# Patient Record
Sex: Female | Born: 1950 | Race: White | Hispanic: No | Smoking: Current every day smoker
Health system: Southern US, Community
[De-identification: ages and names within clinical notes are randomized; demographics above are authoritative.]

## PROBLEM LIST (undated history)

## (undated) DIAGNOSIS — J40 Bronchitis, not specified as acute or chronic: Secondary | ICD-10-CM

## (undated) DIAGNOSIS — I6522 Occlusion and stenosis of left carotid artery: Secondary | ICD-10-CM

## (undated) DIAGNOSIS — R0600 Dyspnea, unspecified: Secondary | ICD-10-CM

## (undated) DIAGNOSIS — E611 Iron deficiency: Secondary | ICD-10-CM

## (undated) DIAGNOSIS — R55 Syncope and collapse: Secondary | ICD-10-CM

## (undated) DIAGNOSIS — G8929 Other chronic pain: Secondary | ICD-10-CM

## (undated) DIAGNOSIS — Z9889 Other specified postprocedural states: Secondary | ICD-10-CM

## (undated) DIAGNOSIS — F39 Unspecified mood [affective] disorder: Secondary | ICD-10-CM

## (undated) DIAGNOSIS — M4800 Spinal stenosis, site unspecified: Secondary | ICD-10-CM

## (undated) DIAGNOSIS — J449 Chronic obstructive pulmonary disease, unspecified: Secondary | ICD-10-CM

## (undated) DIAGNOSIS — G2581 Restless legs syndrome: Secondary | ICD-10-CM

## (undated) DIAGNOSIS — R519 Headache, unspecified: Secondary | ICD-10-CM

## (undated) DIAGNOSIS — K219 Gastro-esophageal reflux disease without esophagitis: Secondary | ICD-10-CM

## (undated) DIAGNOSIS — G510 Bell's palsy: Secondary | ICD-10-CM

## (undated) DIAGNOSIS — I10 Essential (primary) hypertension: Secondary | ICD-10-CM

## (undated) DIAGNOSIS — F32A Depression, unspecified: Secondary | ICD-10-CM

## (undated) DIAGNOSIS — I639 Cerebral infarction, unspecified: Secondary | ICD-10-CM

## (undated) DIAGNOSIS — F419 Anxiety disorder, unspecified: Secondary | ICD-10-CM

## (undated) DIAGNOSIS — M549 Dorsalgia, unspecified: Principal | ICD-10-CM

## (undated) DIAGNOSIS — M797 Fibromyalgia: Secondary | ICD-10-CM

## (undated) DIAGNOSIS — I350 Nonrheumatic aortic (valve) stenosis: Secondary | ICD-10-CM

## (undated) DIAGNOSIS — J301 Allergic rhinitis due to pollen: Secondary | ICD-10-CM

## (undated) DIAGNOSIS — F329 Major depressive disorder, single episode, unspecified: Secondary | ICD-10-CM

## (undated) DIAGNOSIS — R112 Nausea with vomiting, unspecified: Secondary | ICD-10-CM

## (undated) DIAGNOSIS — R011 Cardiac murmur, unspecified: Secondary | ICD-10-CM

## (undated) DIAGNOSIS — E785 Hyperlipidemia, unspecified: Secondary | ICD-10-CM

## (undated) HISTORY — DX: Gastro-esophageal reflux disease without esophagitis: K21.9

## (undated) HISTORY — DX: Dorsalgia, unspecified: M54.9

## (undated) HISTORY — DX: Hyperlipidemia, unspecified: E78.5

## (undated) HISTORY — DX: Essential (primary) hypertension: I10

## (undated) HISTORY — DX: Allergic rhinitis due to pollen: J30.1

## (undated) HISTORY — PX: TONSILLECTOMY: SUR1361

## (undated) HISTORY — PX: EYE SURGERY: SHX253

## (undated) HISTORY — PX: OTHER SURGICAL HISTORY: SHX169

## (undated) HISTORY — PX: ESOPHAGOGASTRODUODENOSCOPY: SHX1529

## (undated) HISTORY — PX: BACK SURGERY: SHX140

## (undated) HISTORY — PX: FOOT SURGERY: SHX648

## (undated) HISTORY — DX: Unspecified mood (affective) disorder: F39

## (undated) HISTORY — DX: Other chronic pain: G89.29

---
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
## 2001-06-22 ENCOUNTER — Encounter: Payer: Self-pay | Admitting: Family Medicine

## 2001-06-22 ENCOUNTER — Encounter: Admission: RE | Admit: 2001-06-22 | Discharge: 2001-06-22 | Payer: Self-pay | Admitting: Family Medicine

## 2001-06-28 ENCOUNTER — Encounter: Admission: RE | Admit: 2001-06-28 | Discharge: 2001-06-28 | Payer: Self-pay | Admitting: Family Medicine

## 2001-06-28 ENCOUNTER — Encounter: Payer: Self-pay | Admitting: Family Medicine

## 2003-09-29 ENCOUNTER — Encounter: Admission: RE | Admit: 2003-09-29 | Discharge: 2003-09-29 | Payer: Self-pay | Admitting: Family Medicine

## 2004-01-24 ENCOUNTER — Encounter: Admission: RE | Admit: 2004-01-24 | Discharge: 2004-01-24 | Payer: Self-pay | Admitting: Family Medicine

## 2004-05-17 ENCOUNTER — Encounter (INDEPENDENT_AMBULATORY_CARE_PROVIDER_SITE_OTHER): Payer: Self-pay | Admitting: Specialist

## 2004-05-17 ENCOUNTER — Ambulatory Visit (HOSPITAL_COMMUNITY): Admission: RE | Admit: 2004-05-17 | Discharge: 2004-05-17 | Payer: Self-pay | Admitting: Gastroenterology

## 2006-04-02 ENCOUNTER — Encounter: Admission: RE | Admit: 2006-04-02 | Discharge: 2006-04-02 | Payer: Self-pay | Admitting: Family Medicine

## 2006-12-17 ENCOUNTER — Encounter: Payer: Self-pay | Admitting: Family Medicine

## 2008-04-27 ENCOUNTER — Encounter: Admission: RE | Admit: 2008-04-27 | Discharge: 2008-04-27 | Payer: Self-pay | Admitting: Neurosurgery

## 2008-06-13 ENCOUNTER — Encounter: Admission: RE | Admit: 2008-06-13 | Discharge: 2008-06-13 | Payer: Self-pay | Admitting: Obstetrics and Gynecology

## 2008-06-13 ENCOUNTER — Encounter: Admission: RE | Admit: 2008-06-13 | Discharge: 2008-06-13 | Payer: Self-pay | Admitting: Family Medicine

## 2008-07-03 ENCOUNTER — Encounter: Payer: Self-pay | Admitting: Family Medicine

## 2008-09-06 ENCOUNTER — Emergency Department (HOSPITAL_COMMUNITY): Admission: EM | Admit: 2008-09-06 | Discharge: 2008-09-06 | Payer: Self-pay | Admitting: Emergency Medicine

## 2008-09-14 ENCOUNTER — Ambulatory Visit: Payer: Self-pay | Admitting: Family Medicine

## 2008-09-14 DIAGNOSIS — J449 Chronic obstructive pulmonary disease, unspecified: Secondary | ICD-10-CM

## 2008-09-14 DIAGNOSIS — I10 Essential (primary) hypertension: Secondary | ICD-10-CM | POA: Insufficient documentation

## 2008-09-14 DIAGNOSIS — M940 Chondrocostal junction syndrome [Tietze]: Secondary | ICD-10-CM | POA: Insufficient documentation

## 2008-10-18 ENCOUNTER — Telehealth: Payer: Self-pay | Admitting: Family Medicine

## 2008-10-19 ENCOUNTER — Ambulatory Visit: Payer: Self-pay | Admitting: Family Medicine

## 2009-01-03 ENCOUNTER — Telehealth: Payer: Self-pay | Admitting: Family Medicine

## 2009-03-23 ENCOUNTER — Ambulatory Visit: Payer: Self-pay | Admitting: Family Medicine

## 2009-03-27 ENCOUNTER — Telehealth: Payer: Self-pay | Admitting: Family Medicine

## 2009-05-09 ENCOUNTER — Ambulatory Visit: Payer: Self-pay | Admitting: Family Medicine

## 2009-05-09 DIAGNOSIS — M5126 Other intervertebral disc displacement, lumbar region: Secondary | ICD-10-CM

## 2009-05-09 DIAGNOSIS — F17200 Nicotine dependence, unspecified, uncomplicated: Secondary | ICD-10-CM | POA: Insufficient documentation

## 2009-05-28 ENCOUNTER — Telehealth: Payer: Self-pay | Admitting: Family Medicine

## 2009-06-04 ENCOUNTER — Telehealth: Payer: Self-pay | Admitting: Family Medicine

## 2009-06-07 ENCOUNTER — Ambulatory Visit: Payer: Self-pay | Admitting: Family Medicine

## 2009-06-07 DIAGNOSIS — T148 Other injury of unspecified body region: Secondary | ICD-10-CM

## 2009-06-07 DIAGNOSIS — W57XXXA Bitten or stung by nonvenomous insect and other nonvenomous arthropods, initial encounter: Secondary | ICD-10-CM

## 2009-06-20 ENCOUNTER — Encounter: Admission: RE | Admit: 2009-06-20 | Discharge: 2009-06-20 | Payer: Self-pay | Admitting: Neurosurgery

## 2009-07-12 ENCOUNTER — Ambulatory Visit: Payer: Self-pay | Admitting: Family Medicine

## 2009-07-24 ENCOUNTER — Ambulatory Visit: Payer: Self-pay | Admitting: Family Medicine

## 2009-07-24 DIAGNOSIS — E669 Obesity, unspecified: Secondary | ICD-10-CM | POA: Insufficient documentation

## 2009-07-24 LAB — CONVERTED CEMR LAB
ALT: 23 units/L (ref 0–35)
AST: 21 units/L (ref 0–37)
BUN: 11 mg/dL (ref 6–23)
Basophils Relative: 0.6 % (ref 0.0–3.0)
Chloride: 101 meq/L (ref 96–112)
Direct LDL: 154.9 mg/dL
Eosinophils Relative: 1.1 % (ref 0.0–5.0)
GFR calc non Af Amer: 68.12 mL/min (ref >60)
Glucose, Urine, Semiquant: NEGATIVE
HCT: 37.6 % (ref 36.0–46.0)
HDL: 48.2 mg/dL (ref >39.00)
Hemoglobin: 13.2 g/dL (ref 12.0–15.0)
Lymphs Abs: 2.8 10*3/uL (ref 0.7–4.0)
MCV: 95.6 fL (ref 78.0–100.0)
Monocytes Absolute: 0.8 10*3/uL (ref 0.1–1.0)
Monocytes Relative: 7.1 % (ref 3.0–12.0)
Neutro Abs: 6.8 10*3/uL (ref 1.4–7.7)
Nitrite: NEGATIVE
Potassium: 4.1 meq/L (ref 3.5–5.1)
Protein, U semiquant: NEGATIVE
RBC: 3.93 M/uL (ref 3.87–5.11)
Sodium: 137 meq/L (ref 135–145)
TSH: 1.31 microintl units/mL (ref 0.35–5.50)
Total Bilirubin: 0.9 mg/dL (ref 0.3–1.2)
Total CHOL/HDL Ratio: 5
Total Protein: 6.8 g/dL (ref 6.0–8.3)
VLDL: 21.8 mg/dL (ref 0.0–40.0)
WBC Urine, dipstick: NEGATIVE
WBC: 10.6 10*3/uL — ABNORMAL HIGH (ref 4.5–10.5)
pH: 7

## 2009-08-09 ENCOUNTER — Telehealth: Payer: Self-pay | Admitting: Family Medicine

## 2009-08-15 ENCOUNTER — Telehealth: Payer: Self-pay | Admitting: Family Medicine

## 2009-08-23 ENCOUNTER — Encounter: Admission: RE | Admit: 2009-08-23 | Discharge: 2009-08-23 | Payer: Self-pay | Admitting: Family Medicine

## 2009-08-23 ENCOUNTER — Encounter: Payer: Self-pay | Admitting: Family Medicine

## 2009-08-23 LAB — HM MAMMOGRAPHY

## 2009-10-12 ENCOUNTER — Ambulatory Visit: Payer: Self-pay | Admitting: Family Medicine

## 2009-10-23 ENCOUNTER — Ambulatory Visit: Payer: Self-pay | Admitting: Family Medicine

## 2009-10-25 ENCOUNTER — Ambulatory Visit: Payer: Self-pay | Admitting: Family Medicine

## 2009-10-25 LAB — CONVERTED CEMR LAB
Direct LDL: 187.2 mg/dL
HDL: 48.4 mg/dL (ref >39.00)
Vit D, 25-Hydroxy: 30 ng/mL (ref 30–89)

## 2009-12-24 ENCOUNTER — Ambulatory Visit: Payer: Self-pay | Admitting: Family Medicine

## 2009-12-24 DIAGNOSIS — J209 Acute bronchitis, unspecified: Secondary | ICD-10-CM

## 2009-12-27 ENCOUNTER — Ambulatory Visit: Payer: Self-pay | Admitting: Family Medicine

## 2009-12-28 ENCOUNTER — Telehealth: Payer: Self-pay | Admitting: Family Medicine

## 2010-01-01 ENCOUNTER — Telehealth: Payer: Self-pay | Admitting: Family Medicine

## 2010-01-01 ENCOUNTER — Telehealth: Payer: Self-pay

## 2010-01-07 ENCOUNTER — Telehealth: Payer: Self-pay | Admitting: Family Medicine

## 2010-01-24 ENCOUNTER — Telehealth: Payer: Self-pay | Admitting: Family Medicine

## 2010-02-19 ENCOUNTER — Telehealth: Payer: Self-pay | Admitting: Family Medicine

## 2010-02-26 ENCOUNTER — Telehealth: Payer: Self-pay | Admitting: Family Medicine

## 2010-03-12 ENCOUNTER — Encounter: Payer: Self-pay | Admitting: Family Medicine

## 2010-05-17 ENCOUNTER — Telehealth: Payer: Self-pay | Admitting: Family Medicine

## 2010-05-28 ENCOUNTER — Telehealth (INDEPENDENT_AMBULATORY_CARE_PROVIDER_SITE_OTHER): Payer: Self-pay | Admitting: *Deleted

## 2010-06-25 ENCOUNTER — Telehealth: Payer: Self-pay | Admitting: *Deleted

## 2010-06-26 ENCOUNTER — Telehealth: Payer: Self-pay | Admitting: Family Medicine

## 2010-07-01 ENCOUNTER — Telehealth: Payer: Self-pay | Admitting: Internal Medicine

## 2010-07-05 ENCOUNTER — Telehealth: Payer: Self-pay | Admitting: Family Medicine

## 2010-07-09 ENCOUNTER — Telehealth: Payer: Self-pay | Admitting: Family Medicine

## 2010-07-16 ENCOUNTER — Ambulatory Visit: Payer: Self-pay | Admitting: Family Medicine

## 2010-07-25 ENCOUNTER — Ambulatory Visit: Payer: Self-pay | Admitting: Family Medicine

## 2010-07-25 ENCOUNTER — Encounter: Payer: Self-pay | Admitting: Family Medicine

## 2010-07-25 DIAGNOSIS — R55 Syncope and collapse: Secondary | ICD-10-CM | POA: Insufficient documentation

## 2010-07-25 LAB — CONVERTED CEMR LAB
Albumin: 3.9 g/dL (ref 3.5–5.2)
BUN: 14 mg/dL (ref 6–23)
Basophils Absolute: 0 10*3/uL (ref 0.0–0.1)
CO2: 29 meq/L (ref 19–32)
Cholesterol: 145 mg/dL (ref 0–200)
Eosinophils Absolute: 0.1 10*3/uL (ref 0.0–0.7)
GFR calc non Af Amer: 78.9 mL/min (ref >60.00)
Glucose, Bld: 99 mg/dL (ref 70–99)
HCT: 39.9 % (ref 36.0–46.0)
HDL: 53.6 mg/dL (ref >39.00)
Hemoglobin: 13.9 g/dL (ref 12.0–15.0)
Ketones, ur: NEGATIVE mg/dL
Leukocytes, UA: NEGATIVE
Lymphs Abs: 2.4 10*3/uL (ref 0.7–4.0)
MCHC: 34.9 g/dL (ref 30.0–36.0)
Monocytes Absolute: 0.6 10*3/uL (ref 0.1–1.0)
Neutro Abs: 6.3 10*3/uL (ref 1.4–7.7)
Nitrite: NEGATIVE
Platelets: 394 10*3/uL (ref 150.0–400.0)
Potassium: 4.1 meq/L (ref 3.5–5.1)
RDW: 13.5 % (ref 11.5–14.6)
Sodium: 135 meq/L (ref 135–145)
Specific Gravity, Urine: 1.01 (ref 1.000–1.030)
TSH: 1.7 microintl units/mL (ref 0.35–5.50)
Total Bilirubin: 0.5 mg/dL (ref 0.3–1.2)
Urobilinogen, UA: 0.2 (ref 0.0–1.0)
VLDL: 21.4 mg/dL (ref 0.0–40.0)
pH: 7.5 (ref 5.0–8.0)

## 2010-08-13 ENCOUNTER — Ambulatory Visit
Admission: RE | Admit: 2010-08-13 | Discharge: 2010-08-13 | Payer: Self-pay | Source: Home / Self Care | Attending: Family Medicine | Admitting: Family Medicine

## 2010-08-13 DIAGNOSIS — M949 Disorder of cartilage, unspecified: Secondary | ICD-10-CM

## 2010-08-13 DIAGNOSIS — R079 Chest pain, unspecified: Secondary | ICD-10-CM | POA: Insufficient documentation

## 2010-08-13 DIAGNOSIS — M899 Disorder of bone, unspecified: Secondary | ICD-10-CM | POA: Insufficient documentation

## 2010-08-16 ENCOUNTER — Ambulatory Visit
Admission: RE | Admit: 2010-08-16 | Discharge: 2010-08-16 | Payer: Self-pay | Source: Home / Self Care | Attending: Family Medicine | Admitting: Family Medicine

## 2010-08-25 ENCOUNTER — Encounter: Payer: Self-pay | Admitting: Obstetrics and Gynecology

## 2010-09-03 NOTE — Progress Notes (Signed)
Summary: Requesting OV  Phone Note Call from Patient   Caller: Patient Call For: [name] MD/Fry/Suandrea Summary of Call: VM from pt c/o "sick all week, diarrhea X 2 days, congestion, has chronic bronchitis, pain pills not working".  requested OV with Dr Clent Ridges today or next week Monday.  Only acute visits available with Dr Clent Ridges, and Dr Scotty Court next week Initial call taken by: Sid Falcon LPN,  July 05, 2010 11:34 AM  Follow-up for Phone Call        have her see Dr. Scotty Court next week  Follow-up by: Nelwyn Salisbury MD,  July 05, 2010 11:49 AM  Additional Follow-up for Phone Call Additional follow up Details #1::        spoke with pt diarrhea since sunday -- stopped   had bright red blood x 2 this am .  symptoms "achy feeling congestion  1/2 way nauseaed no vomiting .  coughing little bit yellow sputum  little bit.  Has not taken fever.  just taking tylenol for achiness which has helped .appt offered    for tuesday and thurday and declined cause  she has "class"  and stated will cont. fluids   and tylenol will call tuesday to speak with dr Scotty Court . questions about her pain meds.  Additional Follow-up by: Pura Spice, RN,  July 05, 2010 1:00 PM

## 2010-09-03 NOTE — Progress Notes (Signed)
Summary: different med  Phone Note Call from Patient Call back at Home Phone [phone]   Caller: Patient Call For: [name] MD Summary of Call:  Pt is taking  nabumetone, pt stated doc was going to call in different antiflamatory med  and also cough med. [facility]  [phone]. pt is aware doc out office until 01-01-2010.  Initial call taken by: Heron Sabins,  Dec 28, 2009 12:30 PM  Follow-up for Phone Call        duplicate mess   Follow-up by: Pura Spice, RN,  Jan 01, 2010 1:48 PM

## 2010-09-03 NOTE — Assessment & Plan Note (Signed)
Summary: right arm pain/multiple complaints/dm   Vital Signs:  Patient profile:   60 year old female Weight:      193 pounds O2 Sat:      95 % Temp:     98.3 degrees F Pulse rate:   104 / minute BP sitting:   130 / 84  (left arm)  Vitals Entered By: Pura Spice, RN (Dec 27, 2009 3:18 PM) CC: saw dr fry on monday was given z pak  c/o not able to move rt arm throat better.    History of Present Illness: This 60 year old white divorced female is in today as a followup from seeing Dr. from May 23 with a history of having had the previous week sore throat aching chills fever as well as a cough ear it she continues to cough and 8 and can't sleep tonight due to the cough cough is nonproductive she also complains of pain in her right arm or shoulder pain all movement. She also continues to have symptoms of costochondritis which theNSAID is not controlling the arthritic pain hypertension well controlled  Allergies: 1)  ! Lipitor (Atorvastatin Calcium) 2)  ! Jonne Ply  Past History:  Past Medical History: Last updated: 09/14/2008 arthritis  chicken pox bronchitis Allergic rhinitis GERD Hyperlipidemia Hypertension  Past Surgical History: Last updated: 09/14/2008 foot surgery 1979 1988 back problems 1997  sciatic nerve bulging disc herniated disc   Social History: Last updated: 09/14/2008 Divorced Occupation:   Marine scientist   Risk Factors: Smoking Status: current (03/23/2009) Packs/Day: 1.0 (03/23/2009)  Review of Systems      See HPI  The patient denies anorexia, fever, weight loss, weight gain, vision loss, decreased hearing, hoarseness, chest pain, syncope, dyspnea on exertion, peripheral edema, prolonged cough, headaches, hemoptysis, abdominal pain, melena, hematochezia, severe indigestion/heartburn, hematuria, incontinence, genital sores, muscle weakness, suspicious skin lesions, transient blindness, difficulty walking, depression, unusual weight change, abnormal  bleeding, enlarged lymph nodes, angioedema, breast masses, and testicular masses.    Physical Exam  General:  Well-developed,well-nourished,in no acute distress; alert,appropriate and cooperative throughout examinationoverweight-appearing.   Head:  Normocephalic and atraumatic without obvious abnormalities. No apparent alopecia or balding. Eyes:  No corneal or conjunctival inflammation noted. EOMI. Perrla. Funduscopic exam benign, without hemorrhages, exudates or papilledema. Vision grossly normal. Ears:  External ear exam shows no significant lesions or deformities.  Otoscopic examination reveals clear canals, tympanic membranes are intact bilaterally without bulging, retraction, inflammation or discharge. Hearing is grossly normal bilaterally. Nose:  External nasal examination shows no deformity or inflammation. Nasal mucosa are pink and moist without lesions or exudates. Mouth:  Oral mucosa and oropharynx without lesions or exudates.  Teeth in good repair. Chest Wall:  minimal tenderness costochondral joint on left 2 through 6 Lungs:  rhonchi chest bilaterally no rales no dullness no wheezes Heart:  Normal rate and regular rhythm. S1 and S2 normal without gallop, murmur, click, rub or other extra sounds. Msk:  tenderness both shoulders but no limitation of movement Extremities:  No clubbing, cyanosis, edema, or deformity noted with normal full range of motion of all joints.     Impression & Recommendations:  Problem # 1:  ARTHRITIS (ICD-716.90) Assessment Deteriorated Depo-Medrol 120 mg plus prednisone decrease in dosage  Problem # 2:  ACUTE BRONCHITIS (ICD-466.0) Assessment: Improved  Her updated medication list for this problem includes:    Symbicort 160-4.5 Mcg/act Aero (Budesonide-formoterol fumarate) .Marland Kitchen... 2 inhalation bid    Hydromet 5-1.5 Mg/48ml Syrp (Hydrocodone-homatropine) .Marland Kitchen... 1 -2 tsp every 4-6  hrs as needed cough  Problem # 3:  ANXIETY DEPRESSION  (ICD-300.4) Assessment: Unchanged  Problem # 4:  EXOGENOUS OBESITY (ICD-278.00) Assessment: Unchanged  Problem # 5:  GERD (ICD-530.81) Assessment: Improved  Her updated medication list for this problem includes:    Prevacid 30 Mg Cpdr (Lansoprazole) .Marland Kitchen... 1 qd  Problem # 6:  COPD, MILD (ICD-496) Assessment: Unchanged  Her updated medication list for this problem includes:    Symbicort 160-4.5 Mcg/act Aero (Budesonide-formoterol fumarate) .Marland Kitchen... 2 inhalation bid  Complete Medication List: 1)  Symbicort 160-4.5 Mcg/act Aero (Budesonide-formoterol fumarate) .... 2 inhalation bid 2)  Prevacid 30 Mg Cpdr (Lansoprazole) .Marland Kitchen.. 1 qd 3)  Lovaza 1 Gm Caps (Omega-3-acid ethyl esters) .... 2 bid 4)  Avalide 150-12.5 Mg Tabs (Irbesartan-hydrochlorothiazide) .Marland Kitchen.. 1 qd 5)  Vitamin D 400 Unit Tabs (Cholecalciferol) 6)  Calcium 500 Mg Tabs (Calcium carbonate) 7)  Alprazolam 1 Mg Tabs (Alprazolam) .Marland Kitchen.. 1 by mouth three times a day as needed stress anxiety. 8)  Hydrocodone-acetaminophen 7.5-500 Mg Tabs (Hydrocodone-acetaminophen) .Marland Kitchen.. 1 by mouth q 4-6 hrs as needed pain not to exceed 4 per day. 9)  Hyzaar 100-25 Mg Tabs (Losartan potassium-hctz) .Marland Kitchen.. 1 qd 10)  Crestor 20 Mg Tabs (Rosuvastatin calcium) .Marland Kitchen.. 1 qd 11)  Prednisone 10 Mg Tabs (Prednisone) .... 2 sta 1 qid one day, 1 three times a day for5 days, 1 two times a day for 10 days then 1 once daily for 9 daystendonitis and costochondritis 12)  Oxycodone-acetaminophen 10-650 Mg Tabs (Oxycodone-acetaminophen) .Marland Kitchen.. 1 q4h as needed severe pain 13)  Sulindac 200 Mg Tabs (Sulindac) .Marland Kitchen.. 1 by mouth two times a day for arthiris or antiinflammatory 14)  Hydromet 5-1.5 Mg/60ml Syrp (Hydrocodone-homatropine) .Marland Kitchen.. 1 -2 tsp every 4-6 hrs as needed cough  Other Orders: Depo- Medrol 80mg  (J1040) Depo- Medrol 40mg  (J1030) Admin of Therapeutic Inj  intramuscular or subcutaneous ([address])  Patient Instructions: 1)  For your bronchitis continued and finished the  Z-Pak 2)  To give you 120 mg Depo-Medrol both for arthritis as well as a bronchitis 3)  Hydromet cough medication 4)  Prednisone decrease in dosage for both costochondritis as well as arthritis Prescriptions: HYDROMET 5-1.5 MG/5ML SYRP (HYDROCODONE-HOMATROPINE) 1 -2 tsp every 4-6 hrs as needed cough  #240 x 0   Entered by:   Pura Spice, RN   Authorized by:   [name] MD   Signed by:   Pura Spice, RN on 01/01/2010   Method used:   Telephoned to ...       [facility]       [address]       [place], [place]  [address]       Ph: [phone] or [phone]       Fax: [phone]   RxID:   [id number] SULINDAC 200 MG TABS (SULINDAC) 1 by mouth two times a day for arthiris or antiinflammatory  #60 x 11   Entered by:   Pura Spice, RN   Authorized by:   [name] MD   Signed by:   Pura Spice, RN on 01/01/2010   Method used:   Electronically to        [facility] (retail)       [address]       [place], [place]  [address]       Ph: [phone] or [phone]       Fax: [phone]   RxID:  [id number] OXYCODONE-ACETAMINOPHEN 10-650 MG TABS (OXYCODONE-ACETAMINOPHEN) 1 q4h as needed severe pain  #100 x 0   Entered and Authorized by:   [name] MD   Signed by:   [name] MD on 12/27/2009   Method used:   Print then Give to Patient   RxID:   [id number] PREDNISONE 10 MG TABS (PREDNISONE) 2 sta 1 qid one day, 1 three times a day for5 days, 1 two times a day for 10 days then 1 once daily for 9 daystendonitis and costochondritis  #40 x 1   Entered and Authorized by:   [name] MD   Signed by:   [name] MD on 12/27/2009   Method used:   [facility]        [facility] (retail)       [address]       [place], [place]  [address]       Ph: [phone] or [phone]       Fax: [phone]   RxID:    [id number]    Medication Administration  Injection # 1:    Medication: Depo- Medrol 80mg     Diagnosis: COSTOCHONDRITIS (ICD-733.6)    Route: IM    Site: RUOQ gluteus    Exp Date: 06/2012    Lot #: [id number]    Mfr: [employer]acia    Patient tolerated injection without complications    Given by: Pura Spice, RN (Dec 27, 2009 4:35 PM)  Injection # 2:    Medication: Depo- Medrol 40mg     Diagnosis: COSTOCHONDRITIS (ICD-733.6)    Route: IM    Site: RUOQ gluteus    Exp Date: 06/2012    Lot #: [id number]    Mfr: [employer]    Patient tolerated injection without complications    Given by: Pura Spice, RN (Dec 27, 2009 4:36 PM)  Orders Added: 1)  Depo- Medrol 80mg  [J1040] 2)  Depo- Medrol 40mg  [J1030] 3)  Admin of Therapeutic Inj  intramuscular or subcutaneous [[address]] 4)  Est. Patient Level IV [[address]]

## 2010-09-03 NOTE — Progress Notes (Signed)
Summary: gets infection easily & requests pain pills  Phone Note Call from Patient Call back at Home Phone [phone]   Caller: Patient Call For: [name] MD Reason for Call: Talk to Doctor Details for Reason: rx  Summary of Call: patient is going to the dentist on August 2nd and would like to know if she should take a ATB before going Initial call taken by: Kern Reap CMA Duncan Dull),  February 26, 2010 5:18 PM  Follow-up for Phone Call        does she go to a cardiologist or has she ever been told she has a heart condition requiring abt . We dont have any cardiac problems in her chart unless she hasn't told us.  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  Does have COPD & chronic bronchitis & is concerned about getting infection from getting her teeth cleaned.  Is out of pain pills & needs refill of hydrocodone to use, not the oxycodone.  The inflammation med was helping, but not enough now for ribs to quit hurting, the costeobronchitis.  [facility] [address]  One in her chart.  Aspirin intolerance.   Follow-up by: Rudy Jew, RN,  February 27, 2010 2:36 PM  Additional Follow-up for Phone Call Additional follow up Details #3:: Details for Additional Follow-up Action Taken: per dr Alfonzo Feller does  not need ABT before dentist and [facility]  Left message H# to notify.  Rudy Jew, RN  February 27, 2010 5:09 PM  Additional Follow-up by: Pura Spice, RN,  February 27, 2010 4:40 PM  Prescriptions: HYDROCODONE-ACETAMINOPHEN 7.5-500 MG TABS (HYDROCODONE-ACETAMINOPHEN) 1 by mouth q 4-6 hrs as needed pain not to exceed 4  per day.  #50 x 1   Entered by:   Pura Spice, RN   Authorized by:   [name] MD   Signed by:   Rudy Jew, RN on 02/27/2010   Method used:   Telephoned to ...       [facility] (retail)       [address]       [place], [place]  [address]       Ph: [phone] or [phone]       Fax: [phone]   RxID:   [id number]

## 2010-09-03 NOTE — Progress Notes (Signed)
Summary: ?about meds   Phone Note Call from Patient Call back at [facility] Phone [phone] Call back at Work Phone [phone]   Caller: Patient Call For: [name] MD Summary of Call: Only wants to talk to Dr. Charmian Muff nurse, about her meds Initial call taken by: [facility] CMA AAMA,  May 28, 2010 12:52 PM  Follow-up for Phone Call        I called pt and she states "she didn't leave any message about meds"? She only wants to speak to Dr Laurita Quint nurse? I informed pt that Almira Coaster is with another MD right now and pt stated that she only wants to talk to Dr Laurita Quint nurse. Follow-up by: Josph Macho RMA,  May 28, 2010 1:21 PM  Additional Follow-up for Phone Call Additional follow up Details #1::        left mess to return call  Additional Follow-up by: Pura Spice, RN,  May 28, 2010 1:59 PM

## 2010-09-03 NOTE — Progress Notes (Signed)
Summary: refill on alprazolam  Phone Note From Pharmacy   [facility] Reason for Call: Needs renewal Details for Reason: alprazolam 1mg  Summary of Call: Last filled on 06/03/10-90 tabs-  30 days will be up on 11/29 Initial call taken by: Romualdo Bolk, CMA (AAMA),  June 25, 2010 4:45 PM  Follow-up for Phone Call        ok to fill  x 1 only    on 11/ 28  can send  in tomorrow   because of holiday weekend .   further refills through Dr Scotty Court  Follow-up by: Madelin Headings MD,  June 25, 2010 5:06 PM  Additional Follow-up for Phone Call Additional follow up Details #1::        Faxed to pharmacy. Additional Follow-up by: Romualdo Bolk, CMA (AAMA),  June 26, 2010 8:43 AM    New/Updated Medications: ALPRAZOLAM 1 MG TABS (ALPRAZOLAM) 1 by mouth three times a day as needed stress anxiety. Fill on or after 11/28 Prescriptions: ALPRAZOLAM 1 MG TABS (ALPRAZOLAM) 1 by mouth three times a day as needed stress anxiety. Fill on or after 11/28  #90 x 0   Entered by:   Romualdo Bolk, CMA (AAMA)   Authorized by:   Madelin Headings MD   Signed by:   Romualdo Bolk, CMA (AAMA) on 06/26/2010   Method used:   Handwritten   RxID:   [id number]

## 2010-09-03 NOTE — Assessment & Plan Note (Signed)
Summary: 3 month follow up/cjr   Vital Signs:  Patient profile:   60 year old female Weight:      203 pounds BMI:     33.90 O2 Sat:      95 % Temp:     98.3 degrees F Pulse rate:   96 / minute BP sitting:   140 / 88  (left arm)  Vitals Entered By: Pura Spice, RN (October 23, 2009 1:17 PM) CC: talk about lab results and ? about costcocondritisi   Is Patient Diabetic? No   History of Present Illness: Testicular and year-old white divorced female who has a problem with hyperlipidemia is in today to discuss recent lab results regarding elevated lipids. He complains of no energy however her weight she had been tense and upset recently over some family problems as well as some depression ears were removed as does probably addition of Lexapro to her medication list She is concerned that she continues to have some symptoms of costochondritis and it is in order that we have a chest x-ray. Continues to have arthritic pains and will add nabumetone and stop baclofen that Continues to have some shortness of breath and occasional wheezing GERD is under control with Prevaciid Continues to have low back pain and needs hydrocodone occasionally No other complaints  Allergies: 1)  ! Lipitor (Atorvastatin Calcium) 2)  ! Jonne Ply  Past History:  Past Medical History: Last updated: 09/14/2008 arthritis  chicken pox bronchitis Allergic rhinitis GERD Hyperlipidemia Hypertension  Past Surgical History: Last updated: 09/14/2008 foot surgery 1979 1988 back problems 1997  sciatic nerve bulging disc herniated disc   Social History: Last updated: 09/14/2008 Divorced Occupation:   Marine scientist   Risk Factors: Smoking Status: current (03/23/2009) Packs/Day: 1.0 (03/23/2009)  Review of Systems  The patient denies anorexia, fever, weight loss, weight gain, vision loss, decreased hearing, hoarseness, chest pain, syncope, dyspnea on exertion, peripheral edema, prolonged cough, headaches,  hemoptysis, abdominal pain, melena, hematochezia, severe indigestion/heartburn, hematuria, incontinence, genital sores, muscle weakness, suspicious skin lesions, transient blindness, difficulty walking, depression, unusual weight change, abnormal bleeding, enlarged lymph nodes, angioedema, breast masses, and testicular masses.    Physical Exam  General:  Well-developed,well-nourished,in no acute distress; alert,appropriate and cooperative throughout examination Head:  Normocephalic and atraumatic without obvious abnormalities. No apparent alopecia or balding. Eyes:  No corneal or conjunctival inflammation noted. EOMI. Perrla. Funduscopic exam benign, without hemorrhages, exudates or papilledema. Vision grossly normal. Ears:  External ear exam shows no significant lesions or deformities.  Otoscopic examination reveals clear canals, tympanic membranes are intact bilaterally without bulging, retraction, inflammation or discharge. Hearing is grossly normal bilaterally. Nose:  External nasal examination shows no deformity or inflammation. Nasal mucosa are pink and moist without lesions or exudates. Mouth:  Oral mucosa and oropharynx without lesions or exudates.  Teeth in good repair. Neck:  No deformities, masses, or tenderness noted. Chest Wall:  minimal costochondral tenderness over the left chest rib 2 -8 Lungs:  decreased breath sounds with minimal or slight expiratory wheeze bilaterallyno dullness and no crackles.   Heart:  Normal rate and regular rhythm. S1 and S2 normal without gallop, murmur, click, rub or other extra sounds.   Impression & Recommendations:  Problem # 1:  ANXIETY DEPRESSION (ICD-300.4) Assessment New add Lexapro to alprazolam  Problem # 2:  EXOGENOUS OBESITY (ICD-278.00) Assessment: Unchanged  Problem # 3:  HERNIATED LUMBOSACRAL DISC (ICD-722.10) Assessment: Unchanged  Problem # 4:  SMOKER (ICD-305.1) Assessment: Unchanged  Problem # 5:  COSTOCHONDRITIS  (ICD-733.6) Assessment: Improved  Her updated medication list for this problem includes:    Vitamin D 400 Unit Tabs (Cholecalciferol)    Calcium 500 Mg Tabs (Calcium carbonate)  Orders: T-2 View CXR (71020TC)  Problem # 6:  GERD (ICD-530.81) Assessment: Improved  Her updated medication list for this problem includes:    Prevacid 30 Mg Cpdr (Lansoprazole) .Marland Kitchen... 1 qd  Problem # 7:  HYPERLIPIDEMIA (ICD-272.4) Assessment: Deteriorated  Her updated medication list for this problem includes:    Lovaza 1 Gm Caps (Omega-3-acid ethyl esters) .Marland Kitchen... 2 bid    Crestor 20 Mg Tabs (Rosuvastatin calcium) .Marland Kitchen... 1 qd  Problem # 8:  HYPERTENSION (ICD-401.9) Assessment: Improved  Her updated medication list for this problem includes:    Avalide 150-12.5 Mg Tabs (Irbesartan-hydrochlorothiazide) .Marland Kitchen... 1 qd    Hyzaar 100-25 Mg Tabs (Losartan potassium-hctz) .Marland Kitchen... 1 qd  Problem # 9:  COPD, MILD (ICD-496) Assessment: Unchanged  Her updated medication list for this problem includes:    Symbicort 160-4.5 Mcg/act Aero (Budesonide-formoterol fumarate) .Marland Kitchen... 2 inhalation bid  Complete Medication List: 1)  Symbicort 160-4.5 Mcg/act Aero (Budesonide-formoterol fumarate) .... 2 inhalation bid 2)  Prevacid 30 Mg Cpdr (Lansoprazole) .Marland Kitchen.. 1 qd 3)  Lovaza 1 Gm Caps (Omega-3-acid ethyl esters) .... 2 bid 4)  Avalide 150-12.5 Mg Tabs (Irbesartan-hydrochlorothiazide) .Marland Kitchen.. 1 qd 5)  Vitamin D 400 Unit Tabs (Cholecalciferol) 6)  Calcium 500 Mg Tabs (Calcium carbonate) 7)  Alprazolam 1 Mg Tabs (Alprazolam) .Marland Kitchen.. 1 by mouth three times a day as needed stress anxiety. 8)  Hydrocodone-acetaminophen 7.5-500 Mg Tabs (Hydrocodone-acetaminophen) .Marland Kitchen.. 1 by mouth q 4-6 hrs as needed pain not to exceed 4 per day. 9)  Lexapro 10 Mg Tabs (Escitalopram oxalate) .Marland Kitchen.. 1 qd 10)  Hyzaar 100-25 Mg Tabs (Losartan potassium-hctz) .Marland Kitchen.. 1 qd 11)  Nabumetone 750 Mg Tabs (Nabumetone) .Marland Kitchen.. 1 once daily for inflammation 12)  Crestor 20 Mg  Tabs (Rosuvastatin calcium) .Marland Kitchen.. 1 qd  Patient Instructions: 1)  Since the total cholesterol and LDL or total lipids are elevated we'll increase Crestor 20 mg q.d. also continue lavage and attempt to control back 2)  You need to lose weight. Consider a lower calorie diet and regular exercise.  3)  To help the anxiety and depression would like to add Lexapro 10 mg q.d. in addition to the alprazolam. 4)  Will change diclofenac to Nabumetone Prescriptions: CRESTOR 20 MG TABS (ROSUVASTATIN CALCIUM) 1 qd  #30 x 11   Entered and Authorized by:   [name] MD   Signed by:   [name] MD on 10/25/2009   Method used:   [facility]        [facility] [facility])       [address]       [place], [place]  [address]       Ph: [phone] or [phone]       Fax: [phone]   RxID:   [id number] NABUMETONE 750 MG TABS (NABUMETONE) 1 once daily for inflammation  #60 x 11   Entered and Authorized by:   [name] MD   Signed by:   [name] MD on 10/23/2009   Method used:   [facility]        [facility] ([facility])       [address]       [place], [place]  [address]       Ph:  [phone] or [phone]       Fax: [phone]   RxID:   [id number] HYZAAR 100-25 MG TABS (LOSARTAN POTASSIUM-HCTZ) 1 qd  #90 x 11   Entered and Authorized by:   [name] MD   Signed by:   [name] MD on 10/23/2009   Method used:   [facility]ically to        [facility] (retail)       [address]       [place], [place]  [address]       Ph: [phone] or [phone]       Fax: [phone]   RxID:   [id number]

## 2010-09-03 NOTE — Progress Notes (Signed)
Summary: Rx: Refill  Phone Note Refill Request Message from:  Fax from Pharmacy on July 01, 2010 2:34 PM  Refills Requested: Medication #1:  ALPRAZOLAM 1 MG TABS 1 by mouth three times a day as needed stress anxiety. Fill on or after 11/28   Last Refilled: 06/03/2010   Notes: #90 Initial call taken by: Trixie Dredge,  July 01, 2010 2:35 PM  Follow-up for Phone Call        Med filled on 11/23 by Dr Clent Ridges Carollee Herter Follow-up by: Sid Falcon LPN,  July 02, 2010 8:50 AM

## 2010-09-03 NOTE — Assessment & Plan Note (Signed)
Summary: COLD, CONGESTION // RS   Vital Signs:  Patient profile:   60 year old female Weight:      193 pounds BMI:     32.23 Temp:     98.6 degrees F oral BP sitting:   100 / 70  (left arm) Cuff size:   large  Vitals Entered By: Raechel Ache, RN (Dec 24, 2009 3:10 PM) CC: Sick since Thursday with sore throat, aches, chills, eyes red and matted, sore chest and cough   History of Present Illness: Here for 5 days of coughing up yellow sputum, chest congesiton, and a stuffy head. No fever.   Allergies: 1)  ! Lipitor (Atorvastatin Calcium) 2)  ! Jonne Ply  Past History:  Past Medical History: Reviewed history from 09/14/2008 and no changes required. arthritis  chicken pox bronchitis Allergic rhinitis GERD Hyperlipidemia Hypertension  Review of Systems  The patient denies anorexia, fever, weight loss, weight gain, vision loss, decreased hearing, hoarseness, syncope, dyspnea on exertion, peripheral edema, headaches, hemoptysis, abdominal pain, melena, hematochezia, severe indigestion/heartburn, hematuria, incontinence, genital sores, muscle weakness, suspicious skin lesions, transient blindness, difficulty walking, depression, unusual weight change, abnormal bleeding, enlarged lymph nodes, angioedema, breast masses, and testicular masses.    Physical Exam  General:  Well-developed,well-nourished,in no acute distress; alert,appropriate and cooperative throughout examination Head:  Normocephalic and atraumatic without obvious abnormalities. No apparent alopecia or balding. Eyes:  No corneal or conjunctival inflammation noted. EOMI. Perrla. Funduscopic exam benign, without hemorrhages, exudates or papilledema. Vision grossly normal. Ears:  External ear exam shows no significant lesions or deformities.  Otoscopic examination reveals clear canals, tympanic membranes are intact bilaterally without bulging, retraction, inflammation or discharge. Hearing is grossly normal bilaterally. Nose:   External nasal examination shows no deformity or inflammation. Nasal mucosa are pink and moist without lesions or exudates. Mouth:  Oral mucosa and oropharynx without lesions or exudates.  Teeth in good repair. Neck:  No deformities, masses, or tenderness noted. Lungs:  Normal respiratory effort, chest expands symmetrically. Lungs are clear to auscultation, no crackles or wheezes.   Impression & Recommendations:  Problem # 1:  ACUTE BRONCHITIS (ICD-466.0)  Her updated medication list for this problem includes:    Symbicort 160-4.5 Mcg/act Aero (Budesonide-formoterol fumarate) .Marland Kitchen... 2 inhalation bid    Zithromax Z-pak 250 Mg Tabs (Azithromycin) .Marland Kitchen... As directed  Complete Medication List: 1)  Symbicort 160-4.5 Mcg/act Aero (Budesonide-formoterol fumarate) .... 2 inhalation bid 2)  Prevacid 30 Mg Cpdr (Lansoprazole) .Marland Kitchen.. 1 qd 3)  Lovaza 1 Gm Caps (Omega-3-acid ethyl esters) .... 2 bid 4)  Avalide 150-12.5 Mg Tabs (Irbesartan-hydrochlorothiazide) .Marland Kitchen.. 1 qd 5)  Vitamin D 400 Unit Tabs (Cholecalciferol) 6)  Calcium 500 Mg Tabs (Calcium carbonate) 7)  Alprazolam 1 Mg Tabs (Alprazolam) .Marland Kitchen.. 1 by mouth three times a day as needed stress anxiety. 8)  Hydrocodone-acetaminophen 7.5-500 Mg Tabs (Hydrocodone-acetaminophen) .Marland Kitchen.. 1 by mouth q 4-6 hrs as needed pain not to exceed 4 per day. 9)  Hyzaar 100-25 Mg Tabs (Losartan potassium-hctz) .Marland Kitchen.. 1 qd 10)  Nabumetone 750 Mg Tabs (Nabumetone) .Marland Kitchen.. 1 once daily for inflammation 11)  Crestor 20 Mg Tabs (Rosuvastatin calcium) .Marland Kitchen.. 1 qd 12)  Zithromax Z-pak 250 Mg Tabs (Azithromycin) .... As directed  Patient Instructions: 1)  Please schedule a follow-up appointment as needed .  Prescriptions: ZITHROMAX Z-PAK 250 MG TABS (AZITHROMYCIN) as directed  #1 x 0   Entered and Authorized by:   Nelwyn Salisbury MD   Signed by:   Nelwyn Salisbury  MD on 12/24/2009   Method used:   Print then Give to Patient   RxID:   [id number]

## 2010-09-03 NOTE — Progress Notes (Signed)
Summary: Pt req to get an order to have cholesterol & liver lvls checked  Phone Note Call from Patient Call back at Work Phone [phone]   Caller: Patient Summary of Call: Pt called and said that she has been on Crestor since April 2011 and has not had her cholesterol or liver checked since then. Pt is concerned and would like to get an order for those labs. Pt is sch for cpx with Dr. Scotty Court in Jan 2012, but would like to get labs drawn asap. She did sch cpx labs for 07/16/10, but is really having some side effects with Crestor and is very concerned.   Initial call taken by: Lucy Antigua,  June 26, 2010 4:20 PM  Follow-up for Phone Call        set up a fasting lipid panel and liver panel for 272.4  Follow-up by: Nelwyn Salisbury MD,  June 26, 2010 5:08 PM  Additional Follow-up for Phone Call Additional follow up Details #1::        I called pt and sch lipid and liver panel per Dr Clent Ridges as noted above. Additional Follow-up by: Lucy Antigua,  July 01, 2010 2:04 PM

## 2010-09-03 NOTE — Progress Notes (Signed)
Summary: rx w 5 for alprazolam   Phone Note From Pharmacy   [facility] [facility] Reason for Call: Needs renewal Summary of Call: refill alprazolam  Initial call taken by: Pura Spice, RN,  January 24, 2010 2:23 PM  Follow-up for Phone Call        ok w/ 5 refills per dr Scotty Court,  Follow-up by: Pura Spice, RN,  January 24, 2010 2:23 PM    New/Updated Medications: ALPRAZOLAM 1 MG TABS (ALPRAZOLAM) 1 by mouth three times a day as needed stress anxiety. Prescriptions: ALPRAZOLAM 1 MG TABS (ALPRAZOLAM) 1 by mouth three times a day as needed stress anxiety.  #90 x 5   Entered by:   Pura Spice, RN   Authorized by:   [name] MD   Signed by:   Pura Spice, RN on 01/24/2010   Method used:   Telephoned to ...       [facility] (retail)       [address]       [place], [place]  [address]       Ph: [phone] or [phone]       Fax: [phone]   RxID:   [id number]

## 2010-09-03 NOTE — Progress Notes (Signed)
Summary: call back  Phone Note Call from Patient   Caller: Patient Call For: [name] MD Summary of Call: Pt only wants to speak to [facility] or Dr. Scotty Court.  Phone message sent to [employer]. [phone] Initial call taken by: Lynann Beaver CMA,  January 07, 2010 12:54 PM  Follow-up for Phone Call        called  left mess to return call  Follow-up by: Pura Spice, RN,  January 08, 2010 2:03 PM  Additional Follow-up for Phone Call Additional follow up Details #1::        pt returned  call and left mess that wrist 'much better" and "antiinflammatory med helping"  Additional Follow-up by: Pura Spice, RN,  January 08, 2010 3:19 PM    Additional Follow-up for Phone Call Additional follow up Details #2::    Dr Scotty Court informed. Follow-up by: Pura Spice, RN,  January 08, 2010 3:19 PM

## 2010-09-03 NOTE — Progress Notes (Signed)
Summary: Phone call  call in hydrocoodone alprazolam,pred dose pk   Phone Note Call from Patient   Caller: Patient @ [phone] Reason for Call: Talk to Nurse Summary of Call: Pt called to speak with Almira Coaster, RN ref to increased pain with her costochondritis.... Pt inquiring if there is anything that can help relieve symptoms? Pt adv that she has been taking the diclofenac as well as xanax for the stress of the situation.... Can you advise other?  Initial call taken by: Debbra Riding,  August 15, 2009 3:00 PM  Follow-up for Phone Call        called pt lmom to return call.  Follow-up by: Pura Spice, RN,  August 16, 2009 9:15 AM  Additional Follow-up for Phone Call Additional follow up Details #1::        pt called left mess to return call on home number  with recommendations from dr stafford and requested to increase her alprazolam.  per dr Alfonzo Feller instructions start sterapred dose pak and to stop diclofenac while taking this but may restart diclofenac when fiinished with the dose pak. also, to start hydrocodone  as instructed.  Additional Follow-up by: Pura Spice, RN,  August 16, 2009 11:14 AM    Additional Follow-up for Phone Call Additional follow up Details #2::    per dr Scotty Court ok to increase alprazolam to 1.0 mg three times a day . pt aware.  Follow-up by: Pura Spice, RN,  August 16, 2009 2:10 PM  New/Updated Medications: ALPRAZOLAM 1 MG TABS (ALPRAZOLAM) 1 by mouth three times a day as needed stress anxiety. PREDNISONE (PAK) 10 MG TABS (PREDNISONE) use as directed per package. STOP DICLOFENAC WHILE THIS MED HYDROCODONE-ACETAMINOPHEN 7.5-500 MG TABS (HYDROCODONE-ACETAMINOPHEN) 1 by mouth q 4-6 hrs as needed pain not to exceed 4 per day. Prescriptions: HYDROCODONE-ACETAMINOPHEN 7.5-500 MG TABS (HYDROCODONE-ACETAMINOPHEN) 1 by mouth q 4-6 hrs as needed pain not to exceed 4 per day.  #50 x 1   Entered by:   Pura Spice, RN   Authorized by:   [name]  MD   Signed by:   Pura Spice, RN on 08/16/2009   Method used:   Telephoned to ...       [facility]  [address] (retail)       [address]       [place], [place]  [address]       Ph: [phone] or [phone]       Fax: [phone]   RxID:   [id number] PREDNISONE (PAK) 10 MG TABS (PREDNISONE) use as directed per package. STOP DICLOFENAC WHILE THIS MED  #1 PK x 0   Entered by:   Pura Spice, RN   Authorized by:   [name] MD   Signed by:   Pura Spice, RN on 08/16/2009   Method used:   Telephoned to ...       [facility]  [place] (retail)       [address]       [place], [place]  [address]       Ph: [phone] or [phone]       Fax: [phone]   RxID:   [id number] ALPRAZOLAM 1 MG TABS (ALPRAZOLAM) 1 by mouth three times a day as needed stress anxiety.  #90 x 3   Entered by:   Pura Spice, RN   Authorized by:   [name] MD   Signed by:  Pura Spice, RN on 08/16/2009   Method used:   Telephoned to ...       [facility]  [address] (retail)       [address]       [place], [place]  [address]       Ph: [phone] or [phone]       Fax: [phone]   RxID:   [id number]

## 2010-09-03 NOTE — Procedures (Signed)
Summary: Colonoscopy Report/[facility]  Colonoscopy Report/[facility]   Imported By: Maryln Gottron 04/04/2010 12:29:55  _____________________________________________________________________  External Attachment:    Type:   Image     Comment:   External Document

## 2010-09-03 NOTE — Progress Notes (Signed)
Summary: Pt req referral to [facility] for Bone Density  Phone Note Call from Patient Call back at [facility] Phone [phone]   Caller: Patient Summary of Call: Pt req to get a referral to [facility] for Bone Density Test Fax# [phone]. Pt has appt next week.  Initial call taken by: Lucy Antigua,  August 09, 2009 1:30 PM  Follow-up for Phone Call        ok faxed  Follow-up by: Pura Spice, RN,  August 09, 2009 2:50 PM

## 2010-09-03 NOTE — Progress Notes (Signed)
Summary: different antiinflam 2nd message  Phone Note Call from Patient Call back at [facility] Phone [phone] Call back at Work Phone [phone]   Summary of Call: Different antiinflammatory to be called in.   [facility]  Want call back Initial call taken by: Rudy Jew, RN,  Jan 01, 2010 10:50 AM  Follow-up for Phone Call        per dr Scotty Court called in   sunlidac and some cough med

## 2010-09-03 NOTE — Progress Notes (Signed)
Summary: refill crestor   Phone Note From Pharmacy   [facility]  [address] Summary of Call: refill crestor 20 mg  Initial call taken by: Pura Spice, RN,  Jan 01, 2010 8:14 AM  Follow-up for Phone Call        ok per dr Scotty Court  Follow-up by: Pura Spice, RN,  Jan 01, 2010 8:14 AM

## 2010-09-03 NOTE — Progress Notes (Signed)
Summary: refill 90 day supply crestor.   Phone Note Call from Patient   Caller: Patient  vm  Reason for Call: Refill Medication Summary of Call: needs [facility]ill crestor [facility] for 90 day suppply. cell [phone] Initial call taken by: Pura Spice, RN,  February 19, 2010 8:18 AM  Follow-up for Phone Call        ok [facility].  Follow-up by: Pura Spice, RN,  February 19, 2010 2:46 PM  Additional Follow-up for Phone Call Additional follow up Details #1::        Phone Call Completed Additional Follow-up by: Rudy Jew, RN,  February 19, 2010 3:19 PM    Prescriptions: CRESTOR 20 MG TABS (ROSUVASTATIN CALCIUM) 1 qd  #90 x 3   Entered by:   Pura Spice, RN   Authorized by:   [name] MD   Signed by:   Pura Spice, RN on 02/19/2010   Method used:   Electronically to        [facility]  [address] (retail)       [address]       [place], [place]  [address]       Ph: [phone] or [phone]       Fax: [phone]   RxID:   [id number]

## 2010-09-03 NOTE — Progress Notes (Signed)
Summary: Hydrocodone refill  Phone Note Refill Request Message from:  Fax from Pharmacy on May 17, 2010 4:40 PM  Refills Requested: Medication #1:  HYDROCODONE-ACETAMINOPHEN 7.5-500 MG TABS 1 by mouth q 4-6 hrs as needed pain not to exceed 4 per day.   Dosage confirmed as above?Dosage Confirmed Please advise refill?  Initial call taken by: Josph Macho RMA,  May 17, 2010 4:40 PM  Follow-up for Phone Call        OK to refill with same sig, #50, 1 rf Follow-up by: Danise Edge MD,  May 17, 2010 8:50 PM    Prescriptions: HYDROCODONE-ACETAMINOPHEN 7.5-500 MG TABS (HYDROCODONE-ACETAMINOPHEN) 1 by mouth q 4-6 hrs as needed pain not to exceed 4 per day.  #50 x 1   Entered by:   Josph Macho RMA   Authorized by:   Danise Edge MD   Signed by:   Josph Macho RMA on 05/20/2010   Method used:   Telephoned to ...       [facility]  [address] (retail)       [address]       [place], [place]  [address]       Ph: [phone] or [phone]       Fax: [phone]   RxID:   [id number]

## 2010-09-05 NOTE — Assessment & Plan Note (Signed)
Summary: CPX/CJR   Vital Signs:  Patient profile:   60 year old female Height:      64.5 inches Weight:      200 pounds O2 Sat:      97 % on Room air Temp:     98.8 degrees F oral Pulse rate:   73 / minute Pulse rhythm:   regular BP sitting:   120 / 80  (left arm) Cuff size:   large  Vitals Entered By: Romualdo Bolk, CMA (AAMA) (August 13, 2010 2:40 PM)  O2 Flow:  Room air CC: CPX no pap. Pt has a gyn who does paps.   History of Present Illness: This 60 year old white divorced female is in for complete physical examination on the she has a gynecologist who does her Pap smear She continues to have nagging pain over the chest wall which has been present for some time but has improved She continues to have congestion in the head and chest has a history of COPD on several core Hypertension has been controlled She has chronic back pain has had herniated lumbosacral disc in the past Recurrent costochondritis  Preventive Screening-Counseling & Management  Alcohol-Tobacco     Smoking Status: current     Smoking Cessation Counseling: YES     Packs/Day: 1.0     Year Started: 1965  Current Medications (verified): 1)  Symbicort 160-4.5 Mcg/act Aero (Budesonide-Formoterol Fumarate) .... 2 Inhalation Bid 2)  Prevacid 30 Mg Cpdr (Lansoprazole) .Marland Kitchen.. 1 Qd 3)  Vitamin D 400 Unit Tabs (Cholecalciferol) 4)  Calcium 500 Mg Tabs (Calcium Carbonate) 5)  Hydrocodone-Acetaminophen 7.5-500 Mg Tabs (Hydrocodone-Acetaminophen) .... 1/2 -1 By Mouth Q 4-6 Hrs As Needed Pain Not To Exceed 4 Per Day. 6)  Hyzaar 100-25 Mg Tabs (Losartan Potassium-Hctz) .Marland Kitchen.. 1 Qd 7)  Crestor 20 Mg Tabs (Rosuvastatin Calcium) .Marland Kitchen.. 1 Qd 8)  Sulindac 200 Mg Tabs (Sulindac) .Marland Kitchen.. 1 By Mouth Two Times A Day For Arthiris or Antiinflammatory 9)  Alprazolam 0.5 Mg Tabs (Alprazolam) .Marland Kitchen.. 1 Morn, Midafternoon and 2 Hs 10)  Metoprolol Tartrate 25 Mg Tabs (Metoprolol Tartrate) .Marland Kitchen.. 1 Qd  Allergies (verified): 1)  ! Lipitor  (Atorvastatin Calcium) 2)  ! Jonne Ply  Past History:  Past Medical History: Last updated: 09/14/2008 arthritis  chicken pox bronchitis Allergic rhinitis GERD Hyperlipidemia Hypertension  Past Surgical History: Last updated: 09/14/2008 foot surgery 1979 1988 back problems 1997  sciatic nerve bulging disc herniated disc   Social History: Last updated: 09/14/2008 Divorced Occupation:   Marine scientist   Risk Factors: Smoking Status: current (08/13/2010) Packs/Day: 1.0 (08/13/2010)  Review of Systems      See HPI General:  See HPI; Denies chills, fatigue, fever, loss of appetite, malaise, sleep disorder, sweats, weakness, and weight loss. Eyes:  Denies blurring, discharge, double vision, eye irritation, eye pain, halos, itching, light sensitivity, red eye, vision loss-1 eye, and vision loss-both eyes. ENT:  Complains of nasal congestion. CV:  Denies bluish discoloration of lips or nails, chest pain or discomfort, difficulty breathing at night, difficulty breathing while lying down, fainting, fatigue, leg cramps with exertion, lightheadness, near fainting, palpitations, shortness of breath with exertion, swelling of feet, swelling of hands, and weight gain; hyper tension well-controlled. Resp:  Complains of chest discomfort, cough, and shortness of breath; has chronic history of costochondritis. GI:  Complains of indigestion. GU:  Denies abnormal vaginal bleeding, decreased libido, discharge, dysuria, genital sores, hematuria, incontinence, nocturia, urinary frequency, and urinary hesitancy. MS:  Complains of joint pain and low  back pain. Derm:  Denies changes in color of skin, changes in nail beds, dryness, excessive perspiration, flushing, hair loss, insect bite(s), itching, lesion(s), poor wound healing, and rash. Neuro:  Denies brief paralysis, difficulty with concentration, disturbances in coordination, falling down, headaches, inability to speak, memory loss, numbness, poor  balance, seizures, sensation of room spinning, tingling, tremors, visual disturbances, and weakness. Psych:  Complains of anxiety and depression.  Physical Exam  General:  Well-developed,well-nourished,in no acute distress; alert,appropriate and cooperative throughout examinationoverweight-appearing.   Head:  Normocephalic and atraumatic without obvious abnormalities. No apparent alopecia or balding. Eyes:  No corneal or conjunctival inflammation noted. EOMI. Perrla. Funduscopic exam benign, without hemorrhages, exudates or papilledema. Vision grossly normal. Ears:  External ear exam shows no significant lesions or deformities.  Otoscopic examination reveals clear canals, tympanic membranes are intact bilaterally without bulging, retraction, inflammation or discharge. Hearing is grossly normal bilaterally. Nose:  swollen nasal mucosa erythematous minimal Mouth:  pharyngeal erythema.   Neck:  No deformities, masses, or tenderness noted. Chest Wall:  costochondral tenderness left side rib 328 Breasts:  No mass, nodules, thickening, tenderness, bulging, retraction, inflamation, nipple discharge or skin changes noted.   Lungs:  rhonchi bilaterally with decreased breath sounds occasional rales both bases with minimal expiratory wheeze Heart:  Normal rate and regular rhythm. S1 and S2 normal without gallop, murmur, click, rub or other extra sounds. Abdomen:  Bowel sounds positive,abdomen soft and non-tender without masses, organomegaly or hernias noted. Rectal:  not examined Genitalia:  not examined oncologist Msk:  No deformity or scoliosis noted of thoracic or lumbar spine.   Pulses:  R and L carotid,radial,femoral,dorsalis pedis and posterior tibial pulses are full and equal bilaterally Extremities:  No clubbing, cyanosis, edema, or deformity noted with normal full range of motion of all joints.   Neurologic:  No cranial nerve deficits noted. Station and gait are normal. Plantar reflexes are  down-going bilaterally. DTRs are symmetrical throughout. Sensory, motor and coordinative functions appear intact. Cervical Nodes:  No lymphadenopathy noted Axillary Nodes:  No palpable lymphadenopathy Inguinal Nodes:  No significant adenopathy Psych:  Cognition and judgment appear intact. Alert and cooperative with normal attention span and concentration. No apparent delusions, illusions, hallucinations   Impression & Recommendations:  Problem # 1:  PHYSICAL EXAMINATION (ICD-V70.0) Assessment Unchanged  Problem # 2:  DISORDER OF BONE AND CARTILAGE UNSPECIFIED (ICD-733.90) Assessment: Unchanged  Her updated medication list for this problem includes:    Vitamin D 400 Unit Tabs (Cholecalciferol)    Calcium 500 Mg Tabs (Calcium carbonate)  Problem # 3:  ARTHRITIS (ICD-716.90) Assessment: Unchanged  Problem # 4:  ANXIETY DEPRESSION (ICD-300.4) Assessment: Improved  Problem # 5:  EXOGENOUS OBESITY (ICD-278.00) Assessment: Unchanged  Problem # 6:  COSTOCHONDRITIS (ICD-733.6) Assessment: Unchanged  Her updated medication list for this problem includes:    Vitamin D 400 Unit Tabs (Cholecalciferol)    Calcium 500 Mg Tabs (Calcium carbonate)  Problem # 7:  HYPERTENSION (ICD-401.9) Assessment: Improved  The following medications were removed from the medication list:    Avalide 150-12.5 Mg Tabs (Irbesartan-hydrochlorothiazide) .Marland Kitchen... 1 qd Her updated medication list for this problem includes:    Hyzaar 100-25 Mg Tabs (Losartan potassium-hctz) .Marland Kitchen... 1 qd    Metoprolol Tartrate 25 Mg Tabs (Metoprolol tartrate) .Marland Kitchen... 1 qd  Problem # 8:  HYPERLIPIDEMIA (ICD-272.4) Assessment: Improved  The following medications were removed from the medication list:    Lovaza 1 Gm Caps (Omega-3-acid ethyl esters) .Marland Kitchen... 2 bid Her updated medication list for  this problem includes:    Crestor 20 Mg Tabs (Rosuvastatin calcium) .Marland Kitchen... 1 qd  Problem # 9:  COPD, MILD (ICD-496) Assessment: Unchanged  Her  updated medication list for this problem includes:    Symbicort 160-4.5 Mcg/act Aero (Budesonide-formoterol fumarate) .Marland Kitchen... 2 inhalation bid  Complete Medication List: 1)  Symbicort 160-4.5 Mcg/act Aero (Budesonide-formoterol fumarate) .... 2 inhalation bid 2)  Prevacid 30 Mg Cpdr (Lansoprazole) .Marland Kitchen.. 1 qd 3)  Vitamin D 400 Unit Tabs (Cholecalciferol) 4)  Calcium 500 Mg Tabs (Calcium carbonate) 5)  Hydrocodone-acetaminophen 7.5-500 Mg Tabs (Hydrocodone-acetaminophen) .... 1/2 -1 by mouth q 4-6 hrs as needed pain not to exceed 4 per day. 6)  Hyzaar 100-25 Mg Tabs (Losartan potassium-hctz) .Marland Kitchen.. 1 qd 7)  Crestor 20 Mg Tabs (Rosuvastatin calcium) .Marland Kitchen.. 1 qd 8)  Sulindac 200 Mg Tabs (Sulindac) .Marland Kitchen.. 1 by mouth two times a day for arthiris or antiinflammatory 9)  Alprazolam 0.5 Mg Tabs (Alprazolam) .Marland Kitchen.. 1 morn, midafternoon and 2 hs 10)  Metoprolol Tartrate 25 Mg Tabs (Metoprolol tartrate) .Marland Kitchen.. 1 qd  Other Orders: Depo- Medrol 80mg  (J1040) Admin of Therapeutic Inj  intramuscular or subcutaneous (81191) T-2 View CXR (71020TC) T-Thoracic Spine 2 Views (47829FA)  Patient Instructions: 1)  physical examination revealed mild respiratory infection this time to continue medication which you already have 2)  The laboratory studies and you the report 3)  Continue your isn't as prescribed previously for COPD, lung hyperlipidemia, hypertension and anxiety Prescriptions: PREVACID 30 MG CPDR (LANSOPRAZOLE) 1 qd  #30 x 11   Entered and Authorized by:   [name] MD   Signed by:   [name] MD on 08/13/2010   Method used:   [facility]        [facility] [id number]       [address]       [place], [place]  [address]       Ph: [phone] or [phone]       Fax: [phone]   RxID:   [id number]    Medication Administration  Injection # 1:    Medication: Depo- Medrol 80mg     Diagnosis: CHEST PAIN UNSPECIFIED (ICD-786.50)    Route: IM    Site:  LUOQ gluteus    Exp Date: 02/01/2013    Lot #: [id number]    Mfr: Pharmacia    Comments: Gave 160mg     Patient tolerated injection without complications    Given by: Romualdo Bolk, CMA (AAMA) (August 13, 2010 4:06 PM)  Orders Added: 1)  Depo- Medrol 80mg  [J1040] 2)  Admin of Therapeutic Inj  intramuscular or subcutaneous [96372] 3)  T-2 View CXR [71020TC] 4)  T-Thoracic Spine 2 Views [72070TC] 5)  New Patient 40-64 years [id number]

## 2010-09-05 NOTE — Progress Notes (Signed)
Summary: Pt called and is req to have work in ov with Dr Scotty Court only  Phone Note Call from Patient Call back at [facility] Phone [phone] Call back at Work Phone [phone]   Caller: Patient Summary of Call: Pt is req work in ov to see Dr. Scotty Court asap, either today or thursday this wk. Pt has chest congestion and stuffy nose for over a wk. Pls call.  Initial call taken by: Lucy Antigua,  July 09, 2010 9:44 AM  Follow-up for Phone Call        ok to work in for Thursday but we can only see her for the congestion nothing else Follow-up by: Alfred Levins, CMA,  July 23, 2010 3:29 PM  Additional Follow-up for Phone Call Additional follow up Details #1::        Lft vm for pt to call back to sch acute visit only for 07/25/10 as noted. Waiting on call back. Additional Follow-up by: Lucy Antigua,  July 23, 2010 4:03 PM    Additional Follow-up for Phone Call Additional follow up Details #2::    Pt called back and has been sch for acu visit only for 07/25/10 thurs at 3pm, as noted. Follow-up by: Lucy Antigua,  July 24, 2010 8:46 AM

## 2010-09-05 NOTE — Assessment & Plan Note (Signed)
Summary: chest congestion/stuffy nose/acute issue only/per Dr Scotty Court...   Vital Signs:  Patient profile:   60 year old female Weight:      200 pounds Temp:     98.3 degrees F oral Pulse rate:   93 / minute Pulse rhythm:   regular BP sitting:   132 / 82  (left arm) Cuff size:   large  Vitals Entered By: Alfred Levins, CMA (July 25, 2010 3:29 PM) CC: congestion, cough x1 mth   History of Present Illness: This 60 year old divorced white female with well-controlled hypertension blood exogenous obesity is in today complaining of chest congestion cough which had been present for approximately 3-4 weeks. Productive cough of yellow green sputum which is been present over the last week patient does feel as if she had fever as in bowel 80s and some aching has had some wheezing over this period Continues to have her arthritic problems with her back  Current Medications (verified): 1)  Symbicort 160-4.5 Mcg/act Aero (Budesonide-Formoterol Fumarate) .... 2 Inhalation Bid 2)  Prevacid 30 Mg Cpdr (Lansoprazole) .Marland Kitchen.. 1 Qd 3)  Lovaza 1 Gm Caps (Omega-3-Acid Ethyl Esters) .... 2 Bid 4)  Avalide 150-12.5 Mg Tabs (Irbesartan-Hydrochlorothiazide) .Marland Kitchen.. 1 Qd 5)  Vitamin D 400 Unit Tabs (Cholecalciferol) 6)  Calcium 500 Mg Tabs (Calcium Carbonate) 7)  Alprazolam 1 Mg Tabs (Alprazolam) .Marland Kitchen.. 1 By Mouth Three Times A Day As Needed Stress Anxiety. Fill On or After 11/28 8)  Hydrocodone-Acetaminophen 7.5-500 Mg Tabs (Hydrocodone-Acetaminophen) .Marland Kitchen.. 1 By Mouth Q 4-6 Hrs As Needed Pain Not To Exceed 4 Per Day. 9)  Hyzaar 100-25 Mg Tabs (Losartan Potassium-Hctz) .Marland Kitchen.. 1 Qd 10)  Crestor 20 Mg Tabs (Rosuvastatin Calcium) .Marland Kitchen.. 1 Qd 11)  Oxycodone-Acetaminophen 10-650 Mg Tabs (Oxycodone-Acetaminophen) .Marland Kitchen.. 1 Q4h As Needed Severe Pain 12)  Sulindac 200 Mg Tabs (Sulindac) .Marland Kitchen.. 1 By Mouth Two Times A Day For Arthiris or Antiinflammatory  Allergies (verified): 1)  ! Lipitor (Atorvastatin Calcium) 2)  ! Jonne Ply  Past  History:  Past Medical History: Last updated: 09/14/2008 arthritis  chicken pox bronchitis Allergic rhinitis GERD Hyperlipidemia Hypertension  Past Surgical History: Last updated: 09/14/2008 foot surgery 1979 1988 back problems 1997  sciatic nerve bulging disc herniated disc   Social History: Last updated: 09/14/2008 Divorced Occupation:   Marine scientist   Risk Factors: Smoking Status: current (03/23/2009) Packs/Day: 1.0 (03/23/2009)  Review of Systems      See HPI General:  Complains of fatigue and malaise. ENT:  Denies decreased hearing, difficulty swallowing, ear discharge, earache, hoarseness, nasal congestion, nosebleeds, postnasal drainage, ringing in ears, sinus pressure, and sore throat. CV:  Denies bluish discoloration of lips or nails, chest pain or discomfort, difficulty breathing at night, difficulty breathing while lying down, fainting, fatigue, leg cramps with exertion, lightheadness, near fainting, palpitations, shortness of breath with exertion, swelling of feet, swelling of hands, and weight gain; hypertension control. Resp:  Complains of cough and wheezing.  Physical Exam  General:  Well-developed,well-nourished,in no acute distress; alert,appropriate and cooperative throughout examination Head:  Normocephalic and atraumatic without obvious abnormalities. No apparent alopecia or balding. Eyes:  No corneal or conjunctival inflammation noted. EOMI. Perrla. Funduscopic exam benign, without hemorrhages, exudates or papilledema. Vision grossly normal. Ears:  External ear exam shows no significant lesions or deformities.  Otoscopic examination reveals clear canals, tympanic membranes are intact bilaterally without bulging, retraction, inflammation or discharge. Hearing is grossly normal bilaterally. Nose:  External nasal examination shows no deformity or inflammation. Nasal mucosa are pink and moist  without lesions or exudates. Mouth:  Oral mucosa and oropharynx  without lesions or exudates.  Teeth in good repair. Neck:  No deformities, masses, or tenderness noted. Lungs:  rhonchi bilaterally minimal expiratory wheeze occasional rales at both bases no dullness to percussion Heart:  Normal rate and regular rhythm. S1 and S2 normal without gallop, murmur, click, rub or other extra sounds. Msk:  tenderness of the lumbosacral spine as well as mid thoracic    Impression & Recommendations:  Problem # 1:  ACUTE BRONCHITIS (ICD-466.0) Assessment Deteriorated  The following medications were removed from the medication list:    Hydromet 5-1.5 Mg/89ml Syrp (Hydrocodone-homatropine) .Marland Kitchen... 1 -2 tsp every 4-6 hrs as needed cough Her updated medication list for this problem includes:    Symbicort 160-4.5 Mcg/act Aero (Budesonide-formoterol fumarate) .Marland Kitchen... 2 inhalation bid  Problem # 2:  ARTHRITIS (ICD-716.90) Assessment: Deteriorated  Problem # 3:  ANXIETY DEPRESSION (ICD-300.4) Assessment: Improved  Complete Medication List: 1)  Symbicort 160-4.5 Mcg/act Aero (Budesonide-formoterol fumarate) .... 2 inhalation bid 2)  Prevacid 30 Mg Cpdr (Lansoprazole) .Marland Kitchen.. 1 qd 3)  Vitamin D 400 Unit Tabs (Cholecalciferol) 4)  Calcium 500 Mg Tabs (Calcium carbonate) 5)  Hydrocodone-acetaminophen 7.5-500 Mg Tabs (Hydrocodone-acetaminophen) .... 1/2 -1 by mouth q 4-6 hrs as needed pain not to exceed 4 per day. 6)  Hyzaar 100-25 Mg Tabs (Losartan potassium-hctz) .Marland Kitchen.. 1 qd 7)  Crestor 20 Mg Tabs (Rosuvastatin calcium) .Marland Kitchen.. 1 qd 8)  Sulindac 200 Mg Tabs (Sulindac) .Marland Kitchen.. 1 by mouth two times a day for arthiris or antiinflammatory 9)  Alprazolam 0.5 Mg Tabs (Alprazolam) .Marland Kitchen.. 1 morn, midafternoon and 2 hs 10)  Metoprolol Tartrate 25 Mg Tabs (Metoprolol tartrate) .Marland Kitchen.. 1 qd  Patient Instructions: 1)  bronchitis, wheezing at night 2)  Levaquin 500mg  each day 3)  Mucinex extra strength 1 each day 4)  restart symbicort 5)  have good fluid intake Prescriptions: SYMBICORT 160-4.5  MCG/ACT AERO (BUDESONIDE-FORMOTEROL FUMARATE) 2 inhalation bid  #1 x 11   Entered by:   Alfred Levins, CMA   Authorized by:   [name] MD   Signed by:   Alfred Levins, CMA on 07/26/2010   Method used:   [facility]        [facility] [facility]       [address]       [place], [place]  [address]       Ph: [phone] or [phone]       Fax: [phone]   RxID:   [id number] HYDROCODONE-ACETAMINOPHEN 7.5-500 MG TABS (HYDROCODONE-ACETAMINOPHEN) 1/2 -1 by mouth q 4-6 hrs as needed pain not to exceed 4 per day.  #100 x 5   Entered and Authorized by:   [name] MD   Signed by:   [name] MD on 07/25/2010   Method used:   Print then Give to Patient   RxID:   [id number] LEVAQUIN 500 MG/100ML SOLN (LEVOFLOXACIN IN D5W) 1 once daily for bronchitis  #10 x 0   Entered and Authorized by:   [name] MD   Signed by:   [name] MD on 07/25/2010   Method used:   [facility]        [facility] ([facility]ail[facility]       [address]       [place], [place]  [address]       Ph: [phone] or [phone]  Fax: [phone]   RxID:   [id number] METOPROLOL TARTRATE 25 MG TABS (METOPROLOL TARTRATE) 1 qd  #30 x 5   Entered and Authorized by:   [name] MD   Signed by:   [name] MD on 07/25/2010   Method used:   [facility]        [facility] (retail)       [address]       [place], [place]  [address]       Ph: [phone] or [phone]       Fax: [phone]   RxID:   [id number] ALPRAZOLAM 0.5 MG TABS (ALPRAZOLAM) 1 morn, midafternoon and 2 hs  #120 x 5   Entered and Authorized by:   [name] MD   Signed by:   [name] MD on 07/25/2010   Method used:   Print then Give to Patient   RxID:   [id number]    Orders Added: 1)  Est. Patient Level IV [601[address]]

## 2010-09-12 ENCOUNTER — Telehealth: Payer: Self-pay | Admitting: Family Medicine

## 2010-09-12 NOTE — Telephone Encounter (Signed)
Pt called and said that she needs a 90  Day supply written for Lansotrazole DR 30mg . Pls call in to [facility].

## 2010-09-13 NOTE — Telephone Encounter (Signed)
Ok to refill [employer]  crr 30 mg x 3

## 2010-10-15 ENCOUNTER — Encounter: Payer: Self-pay | Admitting: Family Medicine

## 2010-10-21 ENCOUNTER — Other Ambulatory Visit: Payer: Self-pay | Admitting: Family Medicine

## 2010-10-21 DIAGNOSIS — Z1231 Encounter for screening mammogram for malignant neoplasm of breast: Secondary | ICD-10-CM

## 2010-10-29 ENCOUNTER — Ambulatory Visit
Admission: RE | Admit: 2010-10-29 | Discharge: 2010-10-29 | Disposition: A | Discharge status: Home / Self Care | Payer: 59 | Source: Ambulatory Visit | Attending: Family Medicine | Admitting: Family Medicine

## 2010-10-29 DIAGNOSIS — Z1231 Encounter for screening mammogram for malignant neoplasm of breast: Secondary | ICD-10-CM

## 2010-10-30 ENCOUNTER — Encounter: Payer: Self-pay | Admitting: Family Medicine

## 2010-10-30 ENCOUNTER — Ambulatory Visit (INDEPENDENT_AMBULATORY_CARE_PROVIDER_SITE_OTHER): Payer: 59 | Admitting: Family Medicine

## 2010-10-30 VITALS — BP 120/84 | Temp 98.3°F | Wt 213.0 lb

## 2010-10-30 DIAGNOSIS — G5600 Carpal tunnel syndrome, unspecified upper limb: Secondary | ICD-10-CM

## 2010-10-30 MED ORDER — NAPROXEN 500 MG PO TABS: 500.0000 mg | ORAL_TABLET | Freq: Two times a day (BID) | ORAL | Status: AC

## 2010-10-30 NOTE — Patient Instructions (Signed)
Use night time R wrist splint for the next couple of weeks. Follow up with primary if no better in 2-3 weeks.

## 2010-10-30 NOTE — Progress Notes (Signed)
  Subjective:    Patient ID: Michelle Park, female    DOB: 1951-01-07, 60 y.o.   MRN: [id number]  HPI Patient seen with some recent left wrist pain. Noted last Friday after doing lots of yard work. Went to urgent care and diagnosed with tendinitis. Prescribed naproxen 500 mg twice daily and left wrist is some better. She had some mild swelling initially. No injury. Now presents with right hand and wrist pain and burning sharp pain right wrist with radiation into the index, middle, and ring fingers and partially thumb but sparing the fifth digit. No known history of carpal tunnel. No significant neck pain. Remains on naproxen 500 mg twice daily   Review of Systems  Constitutional: Negative for unexpected weight change.  Respiratory: Negative for cough and shortness of breath.   Cardiovascular: Negative for chest pain.  Musculoskeletal: Negative for back pain.  Neurological: Negative for syncope and headaches.  Hematological: Negative for adenopathy. Does not bruise/bleed easily.       Objective:   Physical Exam    patient is alert and in no distress. Neck supple no adenopathy. No spinal tenderness Chest clear to auscultation Heart regular rhythm and rate with no murmur Extremities no edema. No joint erythema or warmth. Full range of motion both wrists. Neurologic- she has pain with grip on the right but no true weakness. No muscle atrophy. Normal sensory function.   Assessment & Plan:  #1 probable carpal tunnel syndrome right wrist. Suspect tendinitis left wrist. Refill naproxen 500 mg twice a day. Wrist splint for right wrist especially at night and touch base with primary 2-3 weeks if no better

## 2010-11-01 ENCOUNTER — Other Ambulatory Visit: Payer: Self-pay | Admitting: Family Medicine

## 2010-11-05 ENCOUNTER — Telehealth: Payer: Self-pay | Admitting: *Deleted

## 2010-11-05 NOTE — Telephone Encounter (Signed)
Pt was diagnosed with tendonitis, and saw Dr. Caryl Never last week.   Constant pain in both arms, and nerve pain ??? Wants to be seen by Dr. Scotty Court earlier that the 12th. Pt is taking Naprosyn 500 mg. One po bid.

## 2010-11-06 NOTE — Telephone Encounter (Signed)
Wants the name of a good neurologist.

## 2010-11-07 NOTE — Telephone Encounter (Signed)
Treated tendinitis with prednisone 20mg  decreasing dosage

## 2010-11-14 ENCOUNTER — Ambulatory Visit (INDEPENDENT_AMBULATORY_CARE_PROVIDER_SITE_OTHER): Payer: 59 | Admitting: Family Medicine

## 2010-11-14 ENCOUNTER — Encounter: Payer: Self-pay | Admitting: Family Medicine

## 2010-11-14 VITALS — BP 118/74 | HR 86 | Temp 97.8°F | Wt 209.0 lb

## 2010-11-14 DIAGNOSIS — M150 Primary generalized (osteo)arthritis: Secondary | ICD-10-CM

## 2010-11-14 DIAGNOSIS — M19039 Primary osteoarthritis, unspecified wrist: Secondary | ICD-10-CM

## 2010-11-14 DIAGNOSIS — J449 Chronic obstructive pulmonary disease, unspecified: Secondary | ICD-10-CM

## 2010-11-14 DIAGNOSIS — M159 Polyosteoarthritis, unspecified: Secondary | ICD-10-CM

## 2010-11-14 LAB — SEDIMENTATION RATE: Sed Rate: 18 mm/hr (ref 0–22)

## 2010-11-14 MED ORDER — HYDROCODONE-ACETAMINOPHEN 7.5-500 MG PO TABS: 1.0000 | ORAL_TABLET | Freq: Four times a day (QID) | ORAL | Status: DC | PRN

## 2010-11-14 MED ORDER — ALPRAZOLAM 0.5 MG PO TABS: 0.5000 mg | ORAL_TABLET | Freq: Every day | ORAL | Status: DC

## 2010-11-14 NOTE — Patient Instructions (Signed)
You are having migratory arthritis and persisting rib pain, pains have improved since starting prednisone To chek for rheumatoid arthritis and sedimentation rate, will call results Refilled medications Will refer to rheumatologist, Dr. Dareen Piano

## 2010-11-18 NOTE — Progress Notes (Signed)
Pt aware;  Informed that Camelia Eng will be in contact with pt within the next week

## 2010-11-19 LAB — BASIC METABOLIC PANEL
BUN: 8 mg/dL (ref 6–23)
Creatinine, Ser: 0.65 mg/dL (ref 0.4–1.2)
GFR calc non Af Amer: >60 mL/min (ref >60)
Glucose, Bld: 100 mg/dL — ABNORMAL HIGH (ref 70–99)
Potassium: 4 mEq/L (ref 3.5–5.1)

## 2010-11-19 LAB — DIFFERENTIAL
Basophils Absolute: 0 10*3/uL (ref 0.0–0.1)
Eosinophils Absolute: 0 10*3/uL (ref 0.0–0.7)
Eosinophils Relative: 0 % (ref 0–5)
Lymphocytes Relative: 21 % (ref 12–46)
Neutrophils Relative %: 74 % (ref 43–77)

## 2010-11-19 LAB — CBC
HCT: 39.3 % (ref 36.0–46.0)
Platelets: 260 10*3/uL (ref 150–400)
RDW: 12.8 % (ref 11.5–15.5)

## 2010-11-19 LAB — POCT CARDIAC MARKERS: Troponin i, poc: <0.05 ng/mL (ref 0.00–0.09)

## 2010-12-04 NOTE — Progress Notes (Signed)
  Subjective:    Patient ID: Michelle Park, female    DOB: 1951/04/14, 60 y.o.   MRN: [id number] This 60 year old white female complaining of pain in the right wrist and hand as well as the right shoulder and was seen by Dr. Caryl Never who felt she possibly had carpal tunnel syndrome she relates she has no repetitive movement is concerned about the pain in her shoulder involving ulcer her various she had been taking Naprosyn 500 mg b.i.d. And has had minimal improvement patient has numerous old medical problems is chronic anxiety depression hypertension mild COPD GERD arthritis history of herniated lumbosacral desk as well as episodes of costochondritis which have been difficult to relieve HPI    Review of Systemssee history of present illness     Objective:   Physical Exam patient is a well-developed well-nourished obese white female who appears in no distress cooperative and pleasant significant physical findings HEENT negative Lungs decreased breath sounds with expiratory wheezes no dullness to percussion Tenderness right wrist no positive findings on examination of the right shoulder of which she has pain        Assessment & Plan:

## 2010-12-18 ENCOUNTER — Other Ambulatory Visit: Payer: Self-pay

## 2010-12-18 MED ORDER — METOPROLOL TARTRATE 25 MG PO TABS: 25.0000 mg | ORAL_TABLET | Freq: Every day | ORAL | Status: DC

## 2010-12-18 NOTE — Telephone Encounter (Signed)
Request for authorization to dispense [facility] supply [facility]---ok per Dr. Scotty Court

## 2010-12-26 ENCOUNTER — Other Ambulatory Visit: Payer: Self-pay | Admitting: Family Medicine

## 2010-12-26 DIAGNOSIS — M1990 Unspecified osteoarthritis, unspecified site: Secondary | ICD-10-CM

## 2011-01-09 ENCOUNTER — Telehealth: Payer: Self-pay

## 2011-01-09 NOTE — Telephone Encounter (Signed)
Pt called and stated that she usually gets her xanax #120 instead of 100.  Per Dr. Scotty Court that is correct.  [facility] and he stated that the # of pill would be changed to 120 for following refills and pt is aware.

## 2011-01-23 ENCOUNTER — Telehealth: Payer: Self-pay

## 2011-01-23 NOTE — Telephone Encounter (Signed)
Pt stated she went to rheumatologist and after testing was told she was negative for costochondritis.  Pt states she was not pleased with this result and would like Dr. Scotty Court to call her back.  Pt states she has been having an earache, sore throat, diarrhea, and weakness---pt thinks this may be allergies.  Pt would like Dr. Scotty Court to prescribe a medication.  Please advise.

## 2011-01-24 NOTE — Telephone Encounter (Signed)
Pt said that she is still in extreme pain.  Pt said to be sure to tell Dr Scotty Court to call her on Tues asap.

## 2011-01-24 NOTE — Telephone Encounter (Signed)
Called pt and discussed proble, to look for referral lettewr from Dr. Dareen Piano, also we decided she would call and make an appt with Dr. Wynetta Emery regarding thoracic spine proble. To take claritin for allergy and if no better to make an appt for Tuesday

## 2011-02-04 ENCOUNTER — Telehealth: Payer: Self-pay | Admitting: Family Medicine

## 2011-02-04 NOTE — Telephone Encounter (Signed)
Pt called and said that because the Alprazolam was filled with incorrect amount, pt has run out of med. Pt is req a refill of #20 [facility] [address]. Pt says to be sure to tell pharmacy,DO NOT run this on pt insurance, because insurance will not cover.

## 2011-02-07 ENCOUNTER — Telehealth: Payer: Self-pay

## 2011-02-07 NOTE — Telephone Encounter (Signed)
Called pharmacy to call in rx for alprazolam and pharmacist stated that pt was seeking alprazolam from another physician.   Dr. Scotty Court informed and stated he would call pt and discuss this matter.

## 2011-03-10 ENCOUNTER — Other Ambulatory Visit: Payer: Self-pay | Admitting: Neurosurgery

## 2011-03-10 DIAGNOSIS — M549 Dorsalgia, unspecified: Secondary | ICD-10-CM

## 2011-03-13 ENCOUNTER — Ambulatory Visit
Admission: RE | Admit: 2011-03-13 | Discharge: 2011-03-13 | Disposition: A | Discharge status: Home / Self Care | Payer: 59 | Source: Ambulatory Visit | Attending: Neurosurgery | Admitting: Neurosurgery

## 2011-03-13 DIAGNOSIS — M549 Dorsalgia, unspecified: Secondary | ICD-10-CM

## 2013-03-28 ENCOUNTER — Other Ambulatory Visit: Payer: Self-pay

## 2013-03-28 DIAGNOSIS — Z1231 Encounter for screening mammogram for malignant neoplasm of breast: Secondary | ICD-10-CM

## 2013-03-30 ENCOUNTER — Other Ambulatory Visit: Payer: Self-pay | Admitting: Family Medicine

## 2013-03-30 DIAGNOSIS — E2839 Other primary ovarian failure: Secondary | ICD-10-CM

## 2013-04-19 ENCOUNTER — Ambulatory Visit: Payer: 59

## 2013-04-19 ENCOUNTER — Other Ambulatory Visit: Payer: 59

## 2013-06-03 ENCOUNTER — Ambulatory Visit
Admission: RE | Admit: 2013-06-03 | Discharge: 2013-06-03 | Disposition: A | Discharge status: Home / Self Care | Payer: 59 | Source: Ambulatory Visit

## 2013-06-03 ENCOUNTER — Ambulatory Visit
Admission: RE | Admit: 2013-06-03 | Discharge: 2013-06-03 | Disposition: A | Discharge status: Home / Self Care | Payer: 59 | Source: Ambulatory Visit | Attending: Family Medicine | Admitting: Family Medicine

## 2013-06-03 DIAGNOSIS — E2839 Other primary ovarian failure: Secondary | ICD-10-CM

## 2013-06-03 DIAGNOSIS — Z1231 Encounter for screening mammogram for malignant neoplasm of breast: Secondary | ICD-10-CM

## 2013-07-13 ENCOUNTER — Other Ambulatory Visit (INDEPENDENT_AMBULATORY_CARE_PROVIDER_SITE_OTHER): Payer: Self-pay | Admitting: Surgery

## 2014-04-05 ENCOUNTER — Telehealth: Payer: Self-pay | Admitting: *Deleted

## 2014-04-05 NOTE — Telephone Encounter (Signed)
Enter in error

## 2014-05-29 ENCOUNTER — Other Ambulatory Visit: Payer: Self-pay

## 2014-05-29 DIAGNOSIS — Z1231 Encounter for screening mammogram for malignant neoplasm of breast: Secondary | ICD-10-CM

## 2014-06-12 ENCOUNTER — Ambulatory Visit
Admission: RE | Admit: 2014-06-12 | Discharge: 2014-06-12 | Disposition: A | Discharge status: Home / Self Care | Payer: 59 | Source: Ambulatory Visit

## 2014-06-12 DIAGNOSIS — Z1231 Encounter for screening mammogram for malignant neoplasm of breast: Secondary | ICD-10-CM

## 2014-09-19 ENCOUNTER — Encounter: Payer: Self-pay | Admitting: Internal Medicine

## 2014-09-19 ENCOUNTER — Ambulatory Visit (INDEPENDENT_AMBULATORY_CARE_PROVIDER_SITE_OTHER): Payer: 59 | Admitting: Internal Medicine

## 2014-09-19 ENCOUNTER — Encounter (INDEPENDENT_AMBULATORY_CARE_PROVIDER_SITE_OTHER): Payer: Self-pay

## 2014-09-19 VITALS — BP 128/82 | HR 91 | Temp 98.2°F | Resp 16 | Ht 65.0 in | Wt 225.5 lb

## 2014-09-19 DIAGNOSIS — E785 Hyperlipidemia, unspecified: Secondary | ICD-10-CM | POA: Insufficient documentation

## 2014-09-19 DIAGNOSIS — F39 Unspecified mood [affective] disorder: Secondary | ICD-10-CM | POA: Insufficient documentation

## 2014-09-19 DIAGNOSIS — G8929 Other chronic pain: Secondary | ICD-10-CM

## 2014-09-19 DIAGNOSIS — M549 Dorsalgia, unspecified: Secondary | ICD-10-CM

## 2014-09-19 DIAGNOSIS — K219 Gastro-esophageal reflux disease without esophagitis: Secondary | ICD-10-CM

## 2014-09-19 DIAGNOSIS — I10 Essential (primary) hypertension: Secondary | ICD-10-CM

## 2014-09-19 DIAGNOSIS — J449 Chronic obstructive pulmonary disease, unspecified: Secondary | ICD-10-CM

## 2014-09-19 MED ORDER — ALPRAZOLAM 0.5 MG PO TABS: 0.5000 mg | ORAL_TABLET | Freq: Every day | ORAL | Status: DC

## 2014-09-19 MED ORDER — LANSOPRAZOLE 30 MG PO CPDR: 30.0000 mg | DELAYED_RELEASE_CAPSULE | Freq: Every day | ORAL | Status: DC

## 2014-09-19 NOTE — Assessment & Plan Note (Signed)
Mild , no meds needed Discussed cigarettes smoking

## 2014-09-19 NOTE — Assessment & Plan Note (Signed)
Quiet on PPI 

## 2014-09-19 NOTE — Assessment & Plan Note (Signed)
BP Readings from Last 3 Encounters:  09/19/14 128/82  11/14/10 118/74  10/30/10 120/84   Good control now Recent labs okay

## 2014-09-19 NOTE — Patient Instructions (Addendum)
Please call 1-800 QUIT NOW for help in stopping smoking. I recommend that you stop all smoking and start a 21 or 22mg  nicotine patch daily for at least 3 months. You can use a nicotine lozenge also--if you get an urge while on the patch.  DASH Eating Plan DASH stands for "Dietary Approaches to Stop Hypertension." The DASH eating plan is a healthy eating plan that has been shown to reduce high blood pressure (hypertension). Additional health benefits may include reducing the risk of type 2 diabetes mellitus, heart disease, and stroke. The DASH eating plan may also help with weight loss. WHAT DO I NEED TO KNOW ABOUT THE DASH EATING PLAN? For the DASH eating plan, you will follow these general guidelines:  Choose foods with a percent daily value for sodium of less than 5% (as listed on the food label).  Use salt-free seasonings or herbs instead of table salt or sea salt.  Check with your health care provider or pharmacist before using salt substitutes.  Eat lower-sodium products, often labeled as "lower sodium" or "no salt added."  Eat fresh foods.  Eat more vegetables, fruits, and low-fat dairy products.  Choose whole grains. Look for the word "whole" as the first word in the ingredient list.  Choose fish and skinless chicken or [place] more often than red meat. Limit fish, poultry, and meat to 6 oz (170 g) each day.  Limit sweets, desserts, sugars, and sugary drinks.  Choose heart-healthy fats.  Limit cheese to 1 oz (28 g) per day.  Eat more home-cooked food and less restaurant, buffet, and fast food.  Limit fried foods.  Cook foods using methods other than frying.  Limit canned vegetables. If you do use them, rinse them well to decrease the sodium.  When eating at a restaurant, ask that your food be prepared with less salt, or no salt if possible. WHAT FOODS CAN I EAT? Seek help from a dietitian for individual calorie needs. Grains Whole grain or whole wheat bread. Brown rice.  Whole grain or whole wheat pasta. Quinoa, bulgur, and whole grain cereals. Low-sodium cereals. Corn or whole wheat flour tortillas. Whole grain cornbread. Whole grain crackers. Low-sodium crackers. Vegetables Fresh or frozen vegetables (raw, steamed, roasted, or grilled). Low-sodium or reduced-sodium tomato and vegetable juices. Low-sodium or reduced-sodium tomato sauce and paste. Low-sodium or reduced-sodium canned vegetables.  Fruits All fresh, canned (in natural juice), or frozen fruits. Meat and Other Protein Products Ground beef (85% or leaner), grass-fed beef, or beef trimmed of fat. Skinless chicken or [place]. Ground chicken or [place]. Pork trimmed of fat. All fish and seafood. Eggs. Dried beans, peas, or lentils. Unsalted nuts and seeds. Unsalted canned beans. Dairy Low-fat dairy products, such as skim or 1% milk, 2% or reduced-fat cheeses, low-fat ricotta or cottage cheese, or plain low-fat yogurt. Low-sodium or reduced-sodium cheeses. Fats and Oils Tub margarines without trans fats. Light or reduced-fat mayonnaise and salad dressings (reduced sodium). Avocado. Safflower, olive, or canola oils. Natural peanut or almond butter. Other Unsalted popcorn and pretzels. The items listed above may not be a complete list of recommended foods or beverages. Contact your dietitian for more options. WHAT FOODS ARE NOT RECOMMENDED? Grains White bread. White pasta. White rice. Refined cornbread. Bagels and croissants. Crackers that contain trans fat. Vegetables Creamed or fried vegetables. Vegetables in a cheese sauce. Regular canned vegetables. Regular canned tomato sauce and paste. Regular tomato and vegetable juices. Fruits Dried fruits. Canned fruit in light or heavy syrup. Fruit juice. Meat  and Other Protein Products Fatty cuts of meat. Ribs, chicken wings, bacon, sausage, bologna, salami, chitterlings, fatback, hot dogs, bratwurst, and packaged luncheon meats. Salted nuts and seeds. Canned  beans with salt. Dairy Whole or 2% milk, cream, half-and-half, and cream cheese. Whole-fat or sweetened yogurt. Full-fat cheeses or blue cheese. Nondairy creamers and whipped toppings. Processed cheese, cheese spreads, or cheese curds. Condiments Onion and garlic salt, seasoned salt, table salt, and sea salt. Canned and packaged gravies. Worcestershire sauce. Tartar sauce. Barbecue sauce. Teriyaki sauce. Soy sauce, including reduced sodium. Steak sauce. Fish sauce. Oyster sauce. Cocktail sauce. Horseradish. Ketchup and mustard. Meat flavorings and tenderizers. Bouillon cubes. Hot sauce. Tabasco sauce. Marinades. Taco seasonings. Relishes. Fats and Oils Butter, stick margarine, lard, shortening, ghee, and bacon fat. Coconut, palm kernel, or palm oils. Regular salad dressings. Other Pickles and olives. Salted popcorn and pretzels. The items listed above may not be a complete list of foods and beverages to avoid. Contact your dietitian for more information. WHERE CAN I FIND MORE INFORMATION? National Heart, Lung, and Blood Institute: travelstabloid.com Document Released: 07/10/2011 Document Revised: 12/05/2013 Document Reviewed: 05/25/2013 [facility] Patient Information 2015 [place], [place]. This information is not intended to replace advice given to you by your health care provider. Make sure you discuss any questions you have with your health care provider. Exercise to Lose Weight Exercise and a healthy diet may help you lose weight. Your doctor may suggest specific exercises. EXERCISE IDEAS AND TIPS  Choose low-cost things you enjoy doing, such as walking, bicycling, or exercising to workout videos.  Take stairs instead of the elevator.  Walk during your lunch break.  Park your car further away from work or school.  Go to a gym or an exercise class.  Start with 5 to 10 minutes of exercise each day. Build up to 30 minutes of exercise 4 to 6 days a  week.  Wear shoes with good support and comfortable clothes.  Stretch before and after working out.  Work out until you breathe harder and your heart beats faster.  Drink extra water when you exercise.  Do not do so much that you hurt yourself, feel dizzy, or get very short of breath. Exercises that burn about 150 calories:  Running 1  miles in 15 minutes.  Playing volleyball for 45 to 60 minutes.  Washing and waxing a car for 45 to 60 minutes.  Playing touch football for 45 minutes.  Walking 1  miles in 35 minutes.  Pushing a stroller 1  miles in 30 minutes.  Playing basketball for 30 minutes.  Raking leaves for 30 minutes.  Bicycling 5 miles in 30 minutes.  Walking 2 miles in 30 minutes.  Dancing for 30 minutes.  Shoveling snow for 15 minutes.  Swimming laps for 20 minutes.  Walking up stairs for 15 minutes.  Bicycling 4 miles in 15 minutes.  Gardening for 30 to 45 minutes.  Jumping rope for 15 minutes.  Washing windows or floors for 45 to 60 minutes. Document Released: 08/23/2010 Document Revised: 10/13/2011 Document Reviewed: 08/23/2010 [facility] Patient Information 2015 [place], [place]. This information is not intended to replace advice given to you by your health care provider. Make sure you discuss any questions you have with your health care provider.

## 2014-09-19 NOTE — Progress Notes (Signed)
Pre visit review using our clinic review tool, if applicable. No additional management support is needed unless otherwise documented below in the visit note. 

## 2014-09-19 NOTE — Assessment & Plan Note (Signed)
Doing better now On tylenol

## 2014-09-19 NOTE — Assessment & Plan Note (Signed)
Chronic dysthmia? Some sleep problems Will continue current meds for now as she has fair control of symptoms

## 2014-09-19 NOTE — Assessment & Plan Note (Signed)
Some problems with the statins but for now will continue low dose

## 2014-09-19 NOTE — Progress Notes (Signed)
Subjective:    Patient ID: Michelle Park, female    DOB: 01/26/1951, 64 y.o.   MRN: [id number]  HPI Here to establish Had been past patient of Dr Joni Fears Going elsewhere over the past 3 years or so--- Dr Radene Ou in [place]  Has chronic back pain No clear diagnosis Costochondritis, fibromyalgia, etc Has gone off pain meds Now on lexapro---- tolerates the pain better now Now just using tylenol for pain Chronic mood problems-- stress with suicide of nephew, sister died of lung cancer 11/19/03, mom died Alzheimers 2008, etc Thinks mood issues mostly started after 2nd divorce Some element of anxiety-- has been limited to once a day (but feels she needs it more) Uses this for sleep  Long standing HTN No problems with the med Chronic headaches No anterior chest pain No exercise Breathing is okay---but freq wheezing and cough Still smoking---would like to stop. Did quit in the past with a patch.  On crestor Not that happy with it since she gets some muscle aching Stopped it an was very high off it  Has prevacid This controls her heartburn Occasionally gets slight dysphagia  Current Outpatient Prescriptions on File Prior to Visit  Medication Sig Dispense Refill  . ALPRAZolam (XANAX) 0.5 MG tablet Take 1 tablet (0.5 mg total) by mouth daily. 1 tab 4 times a day 100 tablet 5  . lansoprazole (PREVACID) 30 MG capsule Take 30 mg by mouth daily.      Marland Kitchen losartan-hydrochlorothiazide (HYZAAR) 100-25 MG per tablet TAKE 1 TABLET EVERY DAY 90 tablet 3   No current facility-administered medications on file prior to visit.    Allergies  Allergen Reactions  . Aspirin   . Atorvastatin     REACTION: causes leg pain    Past Medical History  Diagnosis Date  . Chronic back pain   . Allergic rhinitis due to pollen   . GERD (gastroesophageal reflux disease)   . Hyperlipidemia   . Hypertension   . Episodic mood disorder     Past Surgical History  Procedure Laterality Date  . Foot  surgery  1979 and 1988    Family History  Problem Relation Age of Onset  . Heart disease Mother   . Alzheimer's disease Mother   . Cancer Sister     History   Social History  . Marital Status: Divorced    Spouse Name: N/A  . Number of Children: 1  . Years of Education: N/A   Occupational History  . [employer] IT specialist     Retired   Social History Main Topics  . Smoking status: Current Every Day Smoker -- 1.50 packs/day for 45 years    Types: Cigarettes  . Smokeless tobacco: Not on file  . Alcohol Use: 0.0 oz/week    0 Standard drinks or equivalent per week     Comment: rare glass of wine  . Drug Use: No  . Sexual Activity: Not on file   Other Topics Concern  . Not on file   Social History Narrative   Goes by [employer].   1 daughter   Review of Systems  Constitutional: Positive for fatigue and unexpected weight change.       Has gained weight  Eyes: Negative for visual disturbance.  Respiratory: Positive for cough. Negative for shortness of breath.   Cardiovascular: Positive for palpitations. Negative for chest pain and leg swelling.       Occasional palpitations with nerves  Gastrointestinal: Negative for nausea and vomiting.  Bowels at least twice a morning--loose the second time  Genitourinary: Negative for dysuria and hematuria.       Having stress incontinence.  Musculoskeletal: Positive for myalgias, back pain and arthralgias.  Neurological: Positive for weakness and headaches. Negative for dizziness and syncope.  Psychiatric/Behavioral: Positive for sleep disturbance. The patient is nervous/anxious.        Objective:   Physical Exam  Constitutional: She appears well-developed and well-nourished. No distress.  Neck: Normal range of motion. Neck supple. No thyromegaly present.  Cardiovascular: Normal rate, regular rhythm, normal heart sounds and intact distal pulses.  Exam reveals no gallop.   No murmur heard. Pulmonary/Chest: Effort normal. No  respiratory distress. She has no wheezes. She has no rales.  Slightly decreaesd breath sounds at bases  Abdominal: Soft. She exhibits no distension. There is no tenderness.  Musculoskeletal: She exhibits no edema or tenderness.  Lymphadenopathy:    She has no cervical adenopathy.  Psychiatric: She has a normal mood and affect. Her behavior is normal.          Assessment & Plan:

## 2014-09-20 ENCOUNTER — Telehealth: Payer: Self-pay

## 2014-09-20 ENCOUNTER — Telehealth: Payer: Self-pay | Admitting: Internal Medicine

## 2014-09-20 NOTE — Telephone Encounter (Signed)
emmi emailed °

## 2014-09-20 NOTE — Telephone Encounter (Signed)
Crystal with [facility] left v/m requesting cb to clarify instructions for xanax; instructions are 1 tab po at hs and1 tab qid.Please advise.

## 2014-09-20 NOTE — Telephone Encounter (Signed)
Should be 1 tab 4 times daily prn Please adjust med list

## 2014-09-21 ENCOUNTER — Encounter: Payer: Self-pay | Admitting: Internal Medicine

## 2014-09-21 NOTE — Telephone Encounter (Signed)
[facility]oken to [facility] and notified [facility] of Dr Alla German comments.

## 2014-09-25 NOTE — Telephone Encounter (Signed)
Rx called to pharmacy as instructed. Pharmacist stated that this had to be a new script since the #30 has already been picked up.

## 2014-10-17 ENCOUNTER — Other Ambulatory Visit: Payer: Self-pay | Admitting: Internal Medicine

## 2014-10-17 NOTE — Telephone Encounter (Signed)
Approved: 30 x 0 

## 2014-10-17 NOTE — Telephone Encounter (Signed)
Received refill request electronically from pharmacy. Last refill 09/19/14 #30, last office visit same date. Is it okay to refill medication?

## 2014-10-17 NOTE — Telephone Encounter (Signed)
[facility] 

## 2014-11-09 ENCOUNTER — Other Ambulatory Visit: Payer: Self-pay | Admitting: Internal Medicine

## 2014-11-09 NOTE — Telephone Encounter (Signed)
10/17/14 

## 2014-11-09 NOTE — Telephone Encounter (Signed)
Approved: 30 x 0 

## 2014-11-09 NOTE — Telephone Encounter (Signed)
rx called into pharmacy

## 2014-12-07 ENCOUNTER — Other Ambulatory Visit: Payer: Self-pay | Admitting: Internal Medicine

## 2014-12-07 NOTE — Telephone Encounter (Signed)
11/09/14 

## 2014-12-08 NOTE — Telephone Encounter (Signed)
rx called into pharmacy

## 2014-12-08 NOTE — Telephone Encounter (Signed)
Approved: 30 x 0 

## 2014-12-16 ENCOUNTER — Other Ambulatory Visit: Payer: Self-pay | Admitting: Internal Medicine

## 2015-01-07 ENCOUNTER — Other Ambulatory Visit: Payer: Self-pay | Admitting: Internal Medicine

## 2015-01-08 NOTE — Telephone Encounter (Signed)
12/08/14 

## 2015-01-08 NOTE — Telephone Encounter (Signed)
Approved: 30 x 0 

## 2015-01-08 NOTE — Telephone Encounter (Signed)
rx called into pharmacy

## 2015-01-31 ENCOUNTER — Other Ambulatory Visit: Payer: Self-pay | Admitting: Internal Medicine

## 2015-01-31 NOTE — Telephone Encounter (Signed)
01/08/15 

## 2015-02-01 NOTE — Telephone Encounter (Signed)
Approved: okay #30 x 0 

## 2015-02-01 NOTE — Telephone Encounter (Signed)
rx called into pharmacy

## 2015-02-02 ENCOUNTER — Ambulatory Visit (INDEPENDENT_AMBULATORY_CARE_PROVIDER_SITE_OTHER): Payer: 59 | Admitting: Family Medicine

## 2015-02-02 ENCOUNTER — Ambulatory Visit (INDEPENDENT_AMBULATORY_CARE_PROVIDER_SITE_OTHER)
Admission: RE | Admit: 2015-02-02 | Discharge: 2015-02-02 | Disposition: A | Discharge status: Home / Self Care | Payer: 59 | Source: Ambulatory Visit | Attending: Family Medicine | Admitting: Family Medicine

## 2015-02-02 ENCOUNTER — Encounter: Payer: Self-pay | Admitting: Family Medicine

## 2015-02-02 VITALS — BP 112/70 | HR 94 | Temp 98.6°F | Wt 213.5 lb

## 2015-02-02 DIAGNOSIS — M25561 Pain in right knee: Secondary | ICD-10-CM

## 2015-02-02 MED ORDER — IBUPROFEN 600 MG PO TABS: 600.0000 mg | ORAL_TABLET | Freq: Three times a day (TID) | ORAL | Status: DC | PRN

## 2015-02-02 NOTE — Patient Instructions (Signed)
Go to the lab on the way out.  We'll contact you with your xray report. 600mg  ibuprofen with food 3 times a day in the meantime.  Ice your knee.  I'll notify Dr. Silvio Pate.  Take care.

## 2015-02-02 NOTE — Progress Notes (Signed)
Pre visit review using our clinic review tool, if applicable. No additional management support is needed unless otherwise documented below in the visit note.  New patient to me.  Has been seen at clinic prev.  Hx per patient.  She is a difficult historian (she is tangential, will not answer yes/no questions directly), but the following is correct to my knowledge: Had dx of fibromyalgia from prev MD.  Had seen Dr. Saintclair Halsted about her back troubles, s/p PT mult times, no surgeries per patient.  Had seen Dr. Verlin Fester.  Had seen ortho prev re: possible CTS.    Pain in the R posterior knee, then had B hand swelling, then back pain, then B hip pain, over the last 2 days.  No pitting BLE edema.  No trauma to cause the pain.  She has had this scenario prev- knee then back pain, then hand then hip swelling.    If she works in the yard, she'll be in pain for about 3 days thereafter.  Prev was on cymbalta but the effect wore off after about 2 years.  Was on lyrica prev but couldn't tolerate it.   Meds, vitals, and allergies reviewed.   ROS: See HPI.  Otherwise, noncontributory.  nad ncat Neck supple rrr ctab abd soft, not ttp Ext w/o edema R knee without bruising but ttp medially and laterally on the joint line.  ACL MCL and LCL feel solid.  No locking. Patella not ttp with manipulation.  Able to bear weight.

## 2015-02-05 DIAGNOSIS — M25561 Pain in right knee: Secondary | ICD-10-CM | POA: Insufficient documentation

## 2015-02-05 NOTE — Assessment & Plan Note (Signed)
She didn't know why I hadn't read her entire medical history before the office visit.  I told her that I wasn't her primary and her files weren't for my review before today.  I couldn't possibly read years of consult reports before a PRN/same day visit.   I told her that I couldn't diagnosis (in a single visit) her apparent chronic/recurrent pain syndrome that has potentially evaded firm diagnosis from other MDs over the years.   Her knee seemed to bother her the most, so we focused on that.  This could be OA flare vs bursitis/soft tissue irritation.  Okay for outpatient f/u.  Okay for ibuprofen 600mg  TID with routine cautions, see notes on imaging.  >25 minutes spent in face to face time with patient, >50% spent in counselling or coordination of care.  Routed to PCP as FYI.

## 2015-02-08 ENCOUNTER — Ambulatory Visit: Payer: 59 | Admitting: Internal Medicine

## 2015-02-22 ENCOUNTER — Other Ambulatory Visit: Payer: Self-pay | Admitting: Internal Medicine

## 2015-02-22 NOTE — Telephone Encounter (Signed)
02/01/15 

## 2015-02-22 NOTE — Telephone Encounter (Signed)
Approved: okay #30 x 0 

## 2015-02-23 NOTE — Telephone Encounter (Signed)
rx called into pharmacy

## 2015-02-26 ENCOUNTER — Encounter: Payer: Self-pay | Admitting: Family Medicine

## 2015-02-26 ENCOUNTER — Ambulatory Visit (INDEPENDENT_AMBULATORY_CARE_PROVIDER_SITE_OTHER): Payer: 59 | Admitting: Family Medicine

## 2015-02-26 VITALS — BP 140/80 | HR 84 | Temp 98.6°F | Wt 211.8 lb

## 2015-02-26 DIAGNOSIS — M549 Dorsalgia, unspecified: Secondary | ICD-10-CM | POA: Diagnosis not present

## 2015-02-26 DIAGNOSIS — M545 Low back pain: Secondary | ICD-10-CM

## 2015-02-26 DIAGNOSIS — G8929 Other chronic pain: Secondary | ICD-10-CM

## 2015-02-26 MED ORDER — PREDNISONE 20 MG PO TABS: ORAL_TABLET | ORAL | Status: DC

## 2015-02-26 NOTE — Progress Notes (Signed)
Pre visit review using our clinic review tool, if applicable. No additional management support is needed unless otherwise documented below in the visit note.  Years worth of back pain, worse in the last few weeks.  Has seen Dr. Rejeana Brock.  Not sleeping well, taking xanax at night to try to get some sleep. She admits to poor sleeping habits, with the TB still on.  She lives near [place] and the noise from construction is bothersome.  She has RLS per her report.    MRI L spine 2012 with: improvement although incomplete clearance of the right L2-3 foraminal disc protrusion and and a slight increase in the size of the L3-4 right lateral disc protrusion as detailed above.    Still with lower back pain.  Pain getting up in the AM.  Lower midline pain, radiating into the buttocks B.  Has been going on for years.  Same pain as prev, just worse in the last few weeks.  She attributed the inc in pain to more physical activity.  No new B/B sx.   Taking ibuprofen didn't help any.  Icing helps some.  She hasn't seen Dr. Saintclair Halsted recently.    She is trying to quit smoking.    Meds, vitals, and allergies reviewed.   ROS: See HPI.  Otherwise, noncontributory.  nad ncat rrr ctab abd soft, not ttp SLR neg B S/S wnl for the BLE ext Able to bear weight.

## 2015-02-26 NOTE — Patient Instructions (Signed)
Stop ibuprofen, start prednisone.  Rosaria Ferries will call about your referral. Take care.

## 2015-02-26 NOTE — Assessment & Plan Note (Signed)
Stop ibuprofen, start prednisone. Routine cautions given.  Refer to PT.  She'll call about f/u with Dr. Saintclair Halsted.  Faythe Ghee for outpatient f/u.  >25 minutes spent in face to face time with patient, >50% spent in counselling or coordination of care.

## 2015-03-15 ENCOUNTER — Other Ambulatory Visit: Payer: Self-pay | Admitting: Internal Medicine

## 2015-03-15 NOTE — Telephone Encounter (Signed)
Approved: okay #30 x 0 Will discuss frequency of use at upcoming appt

## 2015-03-15 NOTE — Telephone Encounter (Signed)
Last filled 02/23/2015

## 2015-03-16 NOTE — Telephone Encounter (Signed)
rx called into pharmacy

## 2015-03-21 ENCOUNTER — Ambulatory Visit (INDEPENDENT_AMBULATORY_CARE_PROVIDER_SITE_OTHER): Payer: 59 | Admitting: Internal Medicine

## 2015-03-21 ENCOUNTER — Encounter: Payer: Self-pay | Admitting: Internal Medicine

## 2015-03-21 VITALS — BP 140/80 | HR 71 | Temp 98.0°F | Ht 65.0 in | Wt 209.0 lb

## 2015-03-21 DIAGNOSIS — M549 Dorsalgia, unspecified: Secondary | ICD-10-CM | POA: Diagnosis not present

## 2015-03-21 DIAGNOSIS — F39 Unspecified mood [affective] disorder: Secondary | ICD-10-CM

## 2015-03-21 DIAGNOSIS — I10 Essential (primary) hypertension: Secondary | ICD-10-CM | POA: Diagnosis not present

## 2015-03-21 DIAGNOSIS — J449 Chronic obstructive pulmonary disease, unspecified: Secondary | ICD-10-CM

## 2015-03-21 DIAGNOSIS — G8929 Other chronic pain: Secondary | ICD-10-CM

## 2015-03-21 DIAGNOSIS — Z0000 Encounter for general adult medical examination without abnormal findings: Secondary | ICD-10-CM | POA: Insufficient documentation

## 2015-03-21 DIAGNOSIS — E785 Hyperlipidemia, unspecified: Secondary | ICD-10-CM | POA: Diagnosis not present

## 2015-03-21 LAB — COMPREHENSIVE METABOLIC PANEL
ALT: 19 U/L (ref 0–35)
AST: 15 U/L (ref 0–37)
Albumin: 4 g/dL (ref 3.5–5.2)
Alkaline Phosphatase: 51 U/L (ref 39–117)
BUN: 10 mg/dL (ref 6–23)
CHLORIDE: 98 meq/L (ref 96–112)
CO2: 29 meq/L (ref 19–32)
CREATININE: 0.79 mg/dL (ref 0.40–1.20)
Calcium: 9.8 mg/dL (ref 8.4–10.5)
GFR: 77.71 mL/min (ref >60.00)
Glucose, Bld: 109 mg/dL — ABNORMAL HIGH (ref 70–99)
Potassium: 3.8 mEq/L (ref 3.5–5.1)
SODIUM: 133 meq/L — AB (ref 135–145)
Total Bilirubin: 0.4 mg/dL (ref 0.2–1.2)
Total Protein: 6.7 g/dL (ref 6.0–8.3)

## 2015-03-21 LAB — CBC WITH DIFFERENTIAL/PLATELET
BASOS PCT: 1.9 % (ref 0.0–3.0)
Basophils Absolute: 0.2 10*3/uL — ABNORMAL HIGH (ref 0.0–0.1)
Eosinophils Absolute: 0.2 10*3/uL (ref 0.0–0.7)
Eosinophils Relative: 1.7 % (ref 0.0–5.0)
HEMATOCRIT: 40.4 % (ref 36.0–46.0)
Hemoglobin: 13.5 g/dL (ref 12.0–15.0)
LYMPHS PCT: 23.6 % (ref 12.0–46.0)
Lymphs Abs: 2.3 10*3/uL (ref 0.7–4.0)
MCHC: 33.3 g/dL (ref 30.0–36.0)
MCV: 89 fl (ref 78.0–100.0)
Monocytes Absolute: 0.6 10*3/uL (ref 0.1–1.0)
Monocytes Relative: 6.2 % (ref 3.0–12.0)
NEUTROS ABS: 6.5 10*3/uL (ref 1.4–7.7)
Neutrophils Relative %: 66.6 % (ref 43.0–77.0)
PLATELETS: 386 10*3/uL (ref 150.0–400.0)
RBC: 4.54 Mil/uL (ref 3.87–5.11)
RDW: 15.4 % (ref 11.5–15.5)
WBC: 9.7 10*3/uL (ref 4.0–10.5)

## 2015-03-21 LAB — LIPID PANEL
CHOL/HDL RATIO: 6
Cholesterol: 239 mg/dL — ABNORMAL HIGH (ref 0–200)
HDL: 37.1 mg/dL — ABNORMAL LOW (ref >39.00)
NONHDL: 202.35
Triglycerides: 209 mg/dL — ABNORMAL HIGH (ref 0.0–149.0)
VLDL: 41.8 mg/dL — ABNORMAL HIGH (ref 0.0–40.0)

## 2015-03-21 LAB — T4, FREE: Free T4: 1.12 ng/dL (ref 0.60–1.60)

## 2015-03-21 LAB — LDL CHOLESTEROL, DIRECT: Direct LDL: 171 mg/dL

## 2015-03-21 MED ORDER — ZOSTER VACCINE LIVE 19400 UNT/0.65ML ~~LOC~~ SOLR: 0.6500 mL | Freq: Once | SUBCUTANEOUS | Status: DC

## 2015-03-21 NOTE — Assessment & Plan Note (Signed)
Further evaluation by Dr Saintclair Halsted meloxicam

## 2015-03-21 NOTE — Progress Notes (Signed)
Subjective:    Patient ID: Michelle Park, female    DOB: 1951/06/10, 64 y.o.   MRN: [id number]  HPI Here for physical  Ongoing back problems Saw Dr Saintclair Halsted yesterday Getting MRI now for further evaluation On meloxicam--- ibuprofen makes her retain fluid and doesn't work  Still with nervous feelings and depression "Lots going on in my life" No daily depression or anhedonia Eats okay  Still smoking Not ready yet Only occasional cough--mostly at night Clears throat a lot Breathing is generally okay--- unless she really exerts herself (like housework)  Current Outpatient Prescriptions on File Prior to Visit  Medication Sig Dispense Refill  . acetaminophen (TYLENOL) 500 MG tablet Take 1,000 mg by mouth every 6 (six) hours as needed.    . ALPRAZolam (XANAX) 0.5 MG tablet TAKE 1 TABLET BY MOUTH EVERY 4 HOURS AS NEEDED 30 tablet 0  . calcium-vitamin D (OSCAL WITH D) 500-200 MG-UNIT per tablet 1500 mg per day for low bone density    . escitalopram (LEXAPRO) 20 MG tablet TAKE 1 TABLET BY MOUTH EVERY DAY 90 tablet 1  . lansoprazole (PREVACID) 30 MG capsule Take 1 capsule (30 mg total) by mouth daily. 90 capsule 3  . losartan-hydrochlorothiazide (HYZAAR) 100-25 MG per tablet TAKE 1 TABLET BY MOUTH EVERY DAY 90 tablet 3  . Multiple Vitamin (MULTIVITAMIN) capsule Take 1 capsule by mouth daily.     No current facility-administered medications on file prior to visit.    Allergies  Allergen Reactions  . Aspirin   . Atorvastatin     REACTION: causes leg pain    Past Medical History  Diagnosis Date  . Chronic back pain   . Allergic rhinitis due to pollen   . GERD (gastroesophageal reflux disease)   . Hyperlipidemia   . Hypertension   . Episodic mood disorder     Past Surgical History  Procedure Laterality Date  . Foot surgery  1979 and 1988    Family History  Problem Relation Age of Onset  . Heart disease Mother   . Alzheimer's disease Mother   . Cancer Sister     Social  History   Social History  . Marital Status: Divorced    Spouse Name: N/A  . Number of Children: 1  . Years of Education: N/A   Occupational History  . [employer] IT specialist     Retired   Social History Main Topics  . Smoking status: Current Every Day Smoker -- 1.00 packs/day for 45 years    Types: Cigarettes  . Smokeless tobacco: Never Used  . Alcohol Use: 0.0 oz/week    0 Standard drinks or equivalent per week     Comment: rare glass of wine  . Drug Use: No  . Sexual Activity: Not on file   Other Topics Concern  . Not on file   Social History Narrative   Goes by [employer].   1 daughter   Review of Systems  Constitutional:       Has lost weight by working at it Wears seat belt  HENT: Negative for dental problem, hearing loss and tinnitus.        Keeps up with dentist  Eyes: Negative for visual disturbance.       No diplopia or unilateral vision loss Vision exam yearly--following single cataract left  Respiratory: Positive for cough and shortness of breath. Negative for chest tightness.   Cardiovascular: Positive for palpitations. Negative for chest pain and leg swelling.  Gastrointestinal: Negative for  nausea, vomiting, abdominal pain, constipation and blood in stool.       No heartburn  Endocrine: Positive for polydipsia.  Genitourinary: Negative for dysuria, hematuria and difficulty urinating.       No sex--no problem Discussed safe sex  Musculoskeletal: Positive for back pain. Negative for joint swelling.  Skin: Negative for rash.  Allergic/Immunologic: Positive for environmental allergies. Negative for immunocompromised state.  Neurological: Positive for headaches. Negative for dizziness, syncope, weakness and light-headedness.  Psychiatric/Behavioral: Positive for sleep disturbance and dysphoric mood. The patient is nervous/anxious.        Had sleep study--- no major OSA but has restless legs       Objective:   Physical Exam  Constitutional: She is oriented to  person, place, and time. She appears well-developed and well-nourished. No distress.  HENT:  Head: Normocephalic and atraumatic.  Right Ear: External ear normal.  Left Ear: External ear normal.  Mouth/Throat: Oropharynx is clear and moist. No oropharyngeal exudate.  Eyes: Conjunctivae and EOM are normal. Pupils are equal, round, and reactive to light.  Neck: Normal range of motion. Neck supple. No thyromegaly present.  Cardiovascular: Normal rate, regular rhythm, normal heart sounds and intact distal pulses.  Exam reveals no gallop.   No murmur heard. Pulmonary/Chest: Effort normal and breath sounds normal. No respiratory distress. She has no wheezes. She has no rales.  Abdominal: Soft. There is no tenderness.  Musculoskeletal: She exhibits no edema or tenderness.  Lymphadenopathy:    She has no cervical adenopathy.  Neurological: She is alert and oriented to person, place, and time.  Skin: No rash noted. No erythema.  Psychiatric: She has a normal mood and affect. Her behavior is normal.          Assessment & Plan:

## 2015-03-21 NOTE — Assessment & Plan Note (Signed)
Chronic depression and anxiety Related to chronic pain also Continue the escitatopram

## 2015-03-21 NOTE — Assessment & Plan Note (Signed)
Due for pap--she prefers her gyn UTD on mammo--recommended every 2 years Rx for zostavax Yearly flu Discussed fitness

## 2015-03-21 NOTE — Progress Notes (Signed)
Pre visit review using our clinic review tool, if applicable. No additional management support is needed unless otherwise documented below in the visit note. 

## 2015-03-21 NOTE — Assessment & Plan Note (Signed)
Bad myalgias with lipitor and crestor

## 2015-03-21 NOTE — Assessment & Plan Note (Signed)
BP Readings from Last 3 Encounters:  03/21/15 140/80  02/26/15 140/80  02/02/15 112/70   Acceptable control Due for labs

## 2015-03-21 NOTE — Assessment & Plan Note (Signed)
Stable DOE Not ready to quit yet---discussed

## 2015-04-05 ENCOUNTER — Telehealth: Payer: Self-pay

## 2015-04-05 MED ORDER — ALPRAZOLAM 0.5 MG PO TABS: 0.5000 mg | ORAL_TABLET | ORAL | Status: DC | PRN

## 2015-04-05 NOTE — Telephone Encounter (Signed)
I would start with prevnar Can wait till she is 32 and it should be covered--- but should be covered now because of her COPD. I am okay with waiting till December on that if she prefers not to have to pay for both

## 2015-04-05 NOTE — Telephone Encounter (Signed)
Pt called back and ins will pay 100% for pt to have shingles vaccine and pneumonia vaccine. Which pneumonia vaccine do you want pt to have first; the pneumovax or prevnar 13. Pt said she has never had pneumonia vaccine but has had frequent episodes of bronchitis and has dx of COPD. Pt has already scheduled shingles vaccine nurse visit and will schedule nurse visit appt for pneumonia shot at shingles nurse visit. Pt was also made aware alprazolam has already been called in to [facility].

## 2015-04-05 NOTE — Telephone Encounter (Signed)
Approved: okay #30 x 0 

## 2015-04-05 NOTE — Telephone Encounter (Signed)
Pt request refill alprazolam to [facility]. Last seen 03/21/15 for annual exam.rx last printed #30 on 03/16/15.pt is out of med. Pt will ck with ins co about coverage for shingles vaccine and pneumonia shot. [facility] [address] said shingles was not covered by pts ins.

## 2015-04-05 NOTE — Telephone Encounter (Signed)
rx called into pharmacy

## 2015-04-06 NOTE — Telephone Encounter (Signed)
.  left message to have patient return my call.  

## 2015-04-06 NOTE — Telephone Encounter (Signed)
I COULD NOT get a word in with this patient, she does not want a prevnar or pneumonia shot until 30 days after her shingles vaccine. I tried to explain that she can get both at the same time. I agreed with patient and ended the call.

## 2015-04-25 ENCOUNTER — Ambulatory Visit (INDEPENDENT_AMBULATORY_CARE_PROVIDER_SITE_OTHER): Payer: 59 | Admitting: *Deleted

## 2015-04-25 ENCOUNTER — Telehealth: Payer: Self-pay

## 2015-04-25 DIAGNOSIS — Z23 Encounter for immunization: Secondary | ICD-10-CM | POA: Diagnosis not present

## 2015-04-25 DIAGNOSIS — Z86010 Personal history of colonic polyps: Secondary | ICD-10-CM

## 2015-04-25 NOTE — Telephone Encounter (Signed)
Pt walked in with letter from [facility] notifying pt time to get colonoscopy due to pt having colon polyps. Pt requested letter given to Dr Chriss Driver Dr Alla German desk in in box; pt does not want to go back to [facility] due to billing problem when had the last colonoscopy done. Pt request Dr Silvio Pate do referral to different GI. Pt also request refill of xanax to [facility]. rx last refilled # 30 on 04/05/15; pt request larger quantity or higher dosage of xanax due to pt taking physical therapy now and pt usually takes xanax at hs but since doing PT taking during the day as well due to instructions are 1 tab q 4 h prn. Pt has a lot of stress.  Pt request cb. Pt also wanted to know when could get flu shot; advised pt could get flu shot today but pt said ins had advised pt to wait 30 days from getting shingles vaccine (which pt has already gotten today) before taking any other immunization. Pt will schedule nurse visit or flu shot clinic visit for flu shot in one month.

## 2015-04-26 MED ORDER — ALPRAZOLAM 0.5 MG PO TABS: 0.5000 mg | ORAL_TABLET | Freq: Two times a day (BID) | ORAL | Status: DC | PRN

## 2015-04-26 NOTE — Telephone Encounter (Signed)
GI referral placed  Okay to change alprazolam to 0.5mg  bid prn #60 x 0

## 2015-04-26 NOTE — Telephone Encounter (Signed)
rx called into pharmacy Spoke with patient and advised results   

## 2015-04-27 ENCOUNTER — Telehealth: Payer: Self-pay | Admitting: Gastroenterology

## 2015-04-27 ENCOUNTER — Encounter: Payer: Self-pay | Admitting: Gastroenterology

## 2015-04-27 NOTE — Telephone Encounter (Signed)
9/23 records reviewed by Dr. Havery Moros.  Pt will need OV first.

## 2015-05-15 ENCOUNTER — Other Ambulatory Visit: Payer: Self-pay

## 2015-05-15 DIAGNOSIS — Z1231 Encounter for screening mammogram for malignant neoplasm of breast: Secondary | ICD-10-CM

## 2015-05-28 ENCOUNTER — Other Ambulatory Visit: Payer: Self-pay | Admitting: Internal Medicine

## 2015-05-28 NOTE — Telephone Encounter (Signed)
04/26/2015 

## 2015-05-29 NOTE — Telephone Encounter (Signed)
Approved: #[id number] x 0 

## 2015-05-29 NOTE — Telephone Encounter (Signed)
rx called into pharmacy

## 2015-05-31 ENCOUNTER — Ambulatory Visit (INDEPENDENT_AMBULATORY_CARE_PROVIDER_SITE_OTHER): Payer: 59 | Admitting: *Deleted

## 2015-05-31 ENCOUNTER — Telehealth: Payer: Self-pay | Admitting: Internal Medicine

## 2015-05-31 DIAGNOSIS — Z23 Encounter for immunization: Secondary | ICD-10-CM | POA: Diagnosis not present

## 2015-05-31 NOTE — Telephone Encounter (Signed)
Spoke with patient and advised results   

## 2015-05-31 NOTE — Telephone Encounter (Signed)
Pt came in for flu injection today and wanted to schedule her phenomena vaccination. When should she do this and is it okay to schedule her?  I can call her back at [phone]

## 2015-05-31 NOTE — Telephone Encounter (Signed)
She won't get the pneumonia vaccine until she's 77.

## 2015-05-31 NOTE — Telephone Encounter (Signed)
Pt is stating that her insurance said they would cover vaccination at the age of 58. Dr. Silvio Pate would you like this pt to come in a month from flu vaccination or wait until after December when she turns 35?

## 2015-05-31 NOTE — Telephone Encounter (Signed)
I am okay with her coming in a month Confirm which vaccine they are going to cover, pneumovax or prevnar If both and she wants it now, give prevnar

## 2015-06-08 ENCOUNTER — Encounter: Payer: Self-pay | Admitting: Rehabilitation

## 2015-06-08 ENCOUNTER — Ambulatory Visit: Payer: 59 | Attending: Neurosurgery | Admitting: Rehabilitation

## 2015-06-08 DIAGNOSIS — R6889 Other general symptoms and signs: Secondary | ICD-10-CM

## 2015-06-08 DIAGNOSIS — R29898 Other symptoms and signs involving the musculoskeletal system: Secondary | ICD-10-CM | POA: Diagnosis present

## 2015-06-08 DIAGNOSIS — M5441 Lumbago with sciatica, right side: Secondary | ICD-10-CM

## 2015-06-08 DIAGNOSIS — M5442 Lumbago with sciatica, left side: Secondary | ICD-10-CM | POA: Diagnosis present

## 2015-06-08 NOTE — Therapy (Signed)
[facility] [address] [place], [place], [address] Phone: [phone]   Fax:  [phone]  Physical Therapy Evaluation  Patient Details  Name: Michelle Park MRN: [id number] Date of Birth: 21-Jul-1951 Referring Provider: Kary Kos, MD  Encounter Date: 06/08/2015      PT End of Session - 06/08/15 1749    Visit Number 1   Number of Visits 9   Date for PT Re-Evaluation 08/07/15   Authorization Type UHC    PT Start Time 1400   PT Stop Time 1445   PT Time Calculation (min) 45 min   Activity Tolerance Patient tolerated treatment well   Behavior During Therapy [facility] for tasks assessed/performed      Past Medical History  Diagnosis Date  . Chronic back pain   . Allergic rhinitis due to pollen   . GERD (gastroesophageal reflux disease)   . Hyperlipidemia   . Hypertension   . Episodic mood disorder [facility])     Past Surgical History  Procedure Laterality Date  . Foot surgery  1979 and 1988    There were no vitals filed for this visit.  Visit Diagnosis:  Bilateral low back pain with sciatica, sciatica laterality unspecified - Plan: PT plan of care cert/re-cert  Decreased activity tolerance - Plan: PT plan of care cert/re-cert  Decreased strength of trunk and back - Plan: PT plan of care cert/re-cert      Subjective Assessment - 06/08/15 1405    Subjective "I have lower back pain."  "I also have fibromyalgia."  "I got images on my back the last time I was at Dr. Saintclair Halsted and they said that my upper and lower back is messed up."   Limitations Walking;Standing;House hold activities   Patient Stated Goals "to get rid of back pain"    Currently in Pain? 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 2  didn't seem like true balance deficits   Has the patient had a decrease in activity level because of a fear of falling?  No   Is the patient reluctant to leave their home because of a fear of falling?  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   Prior Function   Level  of Independence Independent   Vocation Retired   Leisure working in the yard, shopping   Cognition   Overall Cognitive Status Within Functional Limits for tasks assessed   Observation/Other Assessments   Focus on Therapeutic Outcomes (FOTO)  ABC 43.8   Sensation   Light Touch Appears Intact   Proprioception Appears Intact   Coordination   Gross Motor Movements are Fluid and Coordinated Yes   Fine Motor Movements are Fluid and Coordinated Yes   ROM / Strength   AROM / PROM / Strength Strength   Strength   Overall Strength Within functional limits for tasks performed   Transfers   Transfers Sit to Stand;Stand to Sit   Sit to Stand 7: Independent   Stand to Sit 7: Independent   Ambulation/Gait   Ambulation/Gait Yes   Ambulation/Gait Assistance 7: Independent   Ambulation  Distance (Feet) 115 Feet   Assistive device None   Gait Pattern Step-through pattern;Within Functional Limits   Ambulation Surface Level;Indoor   Gait velocity 3.42 ft/sec                           PT Education - 06/08/15 1749    Education provided Yes   Education Details evaluation findings, POC, and HEP, see pt instruction   Person(s) Educated Patient   Methods Explanation   Comprehension Verbalized understanding          PT Short Term Goals - 06/08/15 1800    PT SHORT TERM GOAL #1   Title Pt will initiate HEP for core strengthening and stabilization to decrease pain during functional moblity. (Target Date: 07/06/15)   Time 4   Period Weeks   PT SHORT TERM GOAL #2   Title Will assess modified oswestry disability index and will improve by 6 points to indicate decreased perceived pain during functional mobility.  (Target Date: 07/06/15)   Time 4   Period Weeks   PT SHORT TERM GOAL #3   Title Pt will report no more than 2 point increase in pain during therapy to indicate decreased pain as it relates to mobility and function. (Target Date: 07/06/15)   PT SHORT TERM GOAL #4   Title Pt will report being able to tolerate 10 mins of activity with no increase in pain to demonstrate decreased pain during ADLs. (Target Date: 07/06/15)   Time 4   Period Weeks           PT Long Term Goals - 06/08/15 1813    PT LONG TERM GOAL #1   Title Pt will be independent with HEP for core stabilization, strengthening and flexibility to indicate improvement in functional mobility. (Target Date: 08/03/15)   Time 8   Period Weeks   PT LONG TERM GOAL #2   Title Pt will increase Modified oswestry disability index by 6 points to indicate pt perceived pain with functional mobility.  (Target Date: 08/03/15)   Time 8   Period Weeks   PT LONG TERM GOAL #3   Title Pt will verbalize initiation of community fitness program in order to indicate continued active lifestyle following therapy  to assist in reducing pain. (Target Date: 08/03/15)   Time 8   Period Weeks   PT LONG TERM GOAL #4   Title Pt will tolerate 20 mins of activity with no more than 2 point increase in pain to demonstrate functional return to ADL's with decreased pain. (Target Date: 08/03/15)  Plan - 06/08/15 1750    Clinical Impression Statement Pt presents with long standing history of low back pain with new imaging showing L2/3, L3/4 disc propulsion with degenerative changes along throacic and lumbar spine.  Pt with increased pain in low back into buttocks and intermittently into LEs.  Upon PT evaluation, not pt with limited lumbar mobility and increased pain with extension, with weak core musculature, therefore initiated HEP for core stabilization and strength.  Pt will benefit from skilled OP neuro PT in order to address deficits and relieve pain.      Pt will benefit from skilled therapeutic intervention in order to improve on the following deficits Decreased activity tolerance;Decreased endurance;Decreased mobility;Decreased strength;Difficulty walking;Impaired flexibility;Improper body mechanics;Postural dysfunction;Pain   Rehab Potential Good   PT Frequency 1x / week   PT Duration 8 weeks   PT Treatment/Interventions Electrical Stimulation;Moist Heat;Traction;Ultrasound;Therapeutic activities;Therapeutic exercise;Patient/family education;Manual techniques   PT Next Visit Plan check compliance/relief from HEP on eval, add abdominal/core strengthening as able   PT Home Exercise Plan see pt instruction   Consulted and Agree with Plan of Care Patient         Problem List Patient Active Problem List   Diagnosis Date Noted  . Preventative health care 03/21/2015  . Chronic back pain   . GERD (gastroesophageal reflux disease)   . Hyperlipidemia   . Hypertension   . Episodic mood disorder ([place])   . 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 SMOKER 05/09/2009  . COPD (chronic obstructive  pulmonary disease) ([place]) 09/14/2008    Cameron Sprang, PT, MPT [facility] [address] [place], [place], [address] Phone: [phone]   Fax:  [phone] 06/08/2015, 6:23 PM  Name: LASHAWN BROMWELL MRN: [id number] Date of Birth: Nov 25, 1950

## 2015-06-08 NOTE — Patient Instructions (Signed)
Pelvic Tilt: Posterior - Legs Bent (Supine)    Tighten stomach and flatten back by rolling pelvis down. Hold __5__ seconds. Relax. Repeat __10__ times per set. Do __2__ sets per session. Do _2___ sessions per day.  http://orth.exer.us/203   Copyright  VHI. All rights reserved.   Bracing With Bridging (Hook-Lying)    With neutral spine, tighten pelvic floor (do posterior pelvic tilt as above)  and abdominals and hold. Lift bottom. Repeat _10__ times. Do __2_ times a day.   Copyright  VHI. All rights reserved.   Abduction: Clam (Eccentric) - Side-Lying    Lie on side with knees bent. Lift top knee, keeping feet together. Keep trunk steady. Slowly lower for 3-5 seconds. _10__ reps per set, _2__ sets per day, _5__ days per week. http://ecce.exer.us/65   Copyright  VHI. All rights reserved.   EXTENSION: Prone - Knee Flexed (Active)    Lie on stomach, right knee bent to 90. Lift leg toward ceiling.  Complete _2__ sets of __10_ repetitions. Perform __2_ sessions per day.     Modified [employer]	[place] face down with your elbows directly below your shoulders. Keeping back straight and your core tight, lift you torso up supporting yourself on arms and knees. Keep your shoulders, hips, and knees all in a line.  Perform slowly, make sure you breath out during the hardest part of exercise.  Perform x 10 reps, 2 times a day.      http://gtsc.exer.us/67   Copyright  VHI. All rights reserved.

## 2015-06-14 ENCOUNTER — Ambulatory Visit
Admission: RE | Admit: 2015-06-14 | Discharge: 2015-06-14 | Disposition: A | Discharge status: Home / Self Care | Payer: 59 | Source: Ambulatory Visit

## 2015-06-14 DIAGNOSIS — Z1231 Encounter for screening mammogram for malignant neoplasm of breast: Secondary | ICD-10-CM

## 2015-06-15 ENCOUNTER — Encounter: Payer: Self-pay | Admitting: Physical Therapy

## 2015-06-15 ENCOUNTER — Ambulatory Visit: Payer: 59 | Admitting: Gastroenterology

## 2015-06-15 ENCOUNTER — Ambulatory Visit: Payer: 59 | Admitting: Physical Therapy

## 2015-06-15 DIAGNOSIS — R6889 Other general symptoms and signs: Secondary | ICD-10-CM

## 2015-06-15 DIAGNOSIS — R29898 Other symptoms and signs involving the musculoskeletal system: Secondary | ICD-10-CM

## 2015-06-15 DIAGNOSIS — M5441 Lumbago with sciatica, right side: Secondary | ICD-10-CM

## 2015-06-15 DIAGNOSIS — M5442 Lumbago with sciatica, left side: Principal | ICD-10-CM

## 2015-06-15 NOTE — Therapy (Signed)
[facility] [address] [place], [place], [address] Phone: [phone]   Fax:  [phone]  Physical Therapy Treatment  Patient Details  Name: Michelle Park MRN: [id number] Date of Birth: [birth year] Referring Provider: Kary Kos, MD  Encounter Date: 06/15/2015      PT End of Session - 06/15/15 1408    Visit Number 2   Number of Visits 9   Date for PT Re-Evaluation 08/07/15   Authorization Type UHC    PT Start Time [id number]   PT Stop Time 1400   PT Time Calculation (min) 45 min   Equipment Utilized During Treatment Gait belt  For physioball exercises.   Activity Tolerance Patient tolerated treatment well   Behavior During Therapy [facility] for tasks assessed/performed      Past Medical History  Diagnosis Date  . Chronic back pain   . Allergic rhinitis due to pollen   . GERD (gastroesophageal reflux disease)   . Hyperlipidemia   . Hypertension   . Episodic mood disorder [facility])     Past Surgical History  Procedure Laterality Date  . Foot surgery  1979 and 1988    There were no vitals filed for this visit.  Visit Diagnosis:  Bilateral low back pain with sciatica, sciatica laterality unspecified  Decreased activity tolerance  Decreased strength of trunk and back      Subjective Assessment - 06/15/15 1322    Subjective (p) Pt reports 6/10 pain with pain meds in low back with shooting into the buttocks, hips and thighs. Pt reports adhering to HEP. "I cant explain it, but I just feel better when sitting. I dont like to stand."    Limitations (p) Walking;Standing;House hold activities   Patient Stated Goals (p) "to get rid of back pain"    Currently in Pain? (p) Yes   Pain Score (p) 6    Pain Location (p) Back   Pain Orientation (p) Right;Left   Pain Descriptors / Indicators (p) Aching;Shooting   Pain Radiating Towards (p) into buttocks, hips and thighs.   Pain Onset (p) More than a month ago   Pain  Frequency (p) Intermittent   Aggravating Factors  (p) walking long periods of time, sitting a lot   Pain Relieving Factors (p) moving around pain meds.     Therapeutic Exercise: To strengthen core muscles to increase stability of the spine to decrease pain and radicular symptoms. Pt supervision with exercises with cues for sequencing and proper form.   Posterior pelvic tilts: 1 set X10 reps. Modified forearm plank on knees. 10, 3 sec holds. Sidelying clams: 2 sets, X10 reps each side. Bridges with TA and pelvic floor contractions: 2 sets, X10 reps. Sitting core balance on blue physioball: Alternating opposite UE and LE raises 1 set, X15 reps each side. Outstretched arm then stabilizing one UE while reaching and rotating to clap the hands together. 1 set, X10 reps each leg, with emphasis on core stability throughout.  Therapeutic Activities:sheet traction with pt in hooklying and sheet placed under pt and wrapped to come up over bil iliac crests. (performed to localize pt's low back pain) 3 sets, X 1.29min holds. Pt reported that radicular symptoms subsided during and after traction.       PT Education - 06/15/15 1406    Education provided Yes   Education Details Pt educated on sitting posture and postural stretches in dining room chair for TV watching. Also sitting on ball exercises for increased balance and core  strengthening to stabilize spine, increase spinal mobility and decreased pain.   Person(s) Educated Patient   Methods Explanation;Demonstration   Comprehension Verbalized understanding;Returned demonstration          PT Short Term Goals - 06/15/15 1415    PT SHORT TERM GOAL #1   Title Pt will initiate HEP for core strengthening and stabilization to decrease pain during functional moblity. (Target Date: 07/06/15)   Time 4   Period Weeks   PT SHORT TERM GOAL #2   Title Will assess modified oswestry disability index and will improve by 6 points to indicate decreased perceived  pain during functional mobility.  (Target Date: 07/06/15)   Time 4   Period Weeks   PT SHORT TERM GOAL #3   Title Pt will report no more than 2 point increase in pain during therapy to indicate decreased pain as it relates to mobility and function. (Target Date: 07/06/15)   PT SHORT TERM GOAL #4   Title Pt will report being able to tolerate 10 mins of activity with no increase in pain to demonstrate decreased pain during ADLs. (Target Date: 07/06/15)   Time 4   Period Weeks           PT Long Term Goals - 06/15/15 1415    PT LONG TERM GOAL #1   Title Pt will be independent with HEP for core stabilization, strengthening and flexibility to indicate improvement in functional mobility. (Target Date: 08/03/15)   Time 8   Period Weeks   PT LONG TERM GOAL #2   Title Pt will increase Modified oswestry disability index by 6 points to indicate pt perceived pain with functional mobility.  (Target Date: 08/03/15)   Time 8   Period Weeks   PT LONG TERM GOAL #3   Title Pt will verbalize initiation of community fitness program in order to indicate continued active lifestyle following therapy to assist in reducing pain. (Target Date: 08/03/15)   Time 8   Period Weeks   PT LONG TERM GOAL #4   Title Pt will tolerate 20 mins of activity with no more than 2 point increase in pain to demonstrate functional return to ADL's with decreased pain. (Target Date: 08/03/15)               Plan - 06/15/15 1410    Clinical Impression Statement Pt is supervision with HEP with verbal and tactile cues for sequencing and proper form. Pt reported pain decrease from 6/10 to 4/10 during treament. Lumbar sheet traction relieved pain substantially.    Pt will benefit from skilled therapeutic intervention in order to improve on the following deficits Decreased activity tolerance;Decreased endurance;Decreased mobility;Decreased strength;Difficulty walking;Impaired flexibility;Improper body mechanics;Postural  dysfunction;Pain   Rehab Potential Good   PT Frequency 1x / week   PT Duration 8 weeks   PT Treatment/Interventions Electrical Stimulation;Moist Heat;Traction;Ultrasound;Therapeutic activities;Therapeutic exercise;Patient/family education;Manual techniques   PT Next Visit Plan advance HEP with additional exercises without physioball, work on advancing physioball exercises while in therapy. Continue educating on correct posture throughout the day.    PT Home Exercise Plan see pt instruction   Consulted and Agree with Plan of Care Patient        Problem List Patient Active Problem List   Diagnosis Date Noted  . Preventative health care 03/21/2015  . Chronic back pain   . GERD (gastroesophageal reflux disease)   . Hyperlipidemia   . Hypertension   . Episodic mood disorder ([place])   . 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  SMOKER 05/09/2009  . COPD (chronic obstructive pulmonary disease) ([place]) 09/14/2008    Laney Potash 06/15/2015, 2:32 PM  Laney Potash, [place]  Name: Michelle Park MRN: [id number] Date of Birth: 1951/06/01  This note has been reviewed and edited by supervising CI.  Willow Ora, PTA, [place] [address], [place], [place] [address] [phone] 06/16/2015, 10:20 PM

## 2015-06-18 ENCOUNTER — Ambulatory Visit: Payer: 59 | Admitting: Physical Therapy

## 2015-06-18 DIAGNOSIS — R6889 Other general symptoms and signs: Secondary | ICD-10-CM

## 2015-06-18 DIAGNOSIS — M5441 Lumbago with sciatica, right side: Secondary | ICD-10-CM

## 2015-06-18 DIAGNOSIS — M5442 Lumbago with sciatica, left side: Principal | ICD-10-CM

## 2015-06-18 DIAGNOSIS — R29898 Other symptoms and signs involving the musculoskeletal system: Secondary | ICD-10-CM

## 2015-06-18 NOTE — Therapy (Signed)
[facility] [address] [place], [place], [address] Phone: [phone]   Fax:  [phone]  Physical Therapy Treatment  Patient Details  Name: Michelle Park MRN: [id number] Date of Birth: September 09, 1950 Referring Provider: Kary Kos, MD  Encounter Date: 06/18/2015      PT End of Session - 06/18/15 1407    Visit Number 3   Number of Visits 9   Date for PT Re-Evaluation 08/07/15   Authorization Type UHC    PT Start Time [id number]   PT Stop Time 1400   PT Time Calculation (min) 45 min   Equipment Utilized During Treatment Gait belt  For physioball exercises.   Activity Tolerance Patient tolerated treatment well   Behavior During Therapy [facility] for tasks assessed/performed      Past Medical History  Diagnosis Date  . Chronic back pain   . Allergic rhinitis due to pollen   . GERD (gastroesophageal reflux disease)   . Hyperlipidemia   . Hypertension   . Episodic mood disorder [facility])     Past Surgical History  Procedure Laterality Date  . Foot surgery  1979 and 1988    There were no vitals filed for this visit.  Visit Diagnosis:  Bilateral low back pain with sciatica, sciatica laterality unspecified  Decreased activity tolerance  Decreased strength of trunk and back      Subjective Assessment - 06/18/15 1402    Subjective Pt reports 6/10 pain with pain meds in low back with shooting into the buttocks, hips and thighs. Pt reported 4/10 pain during treatment. "It does feel better with these exercises".Pt also reports 3/10 pain in left shoulder since last visit.     Limitations Walking;Standing;House hold activities   Patient Stated Goals "to get rid of back pain"    Currently in Pain? Yes   Pain Score 6    Pain Location Back   Pain Orientation Right;Left;Lower   Pain Descriptors / Indicators Aching   Pain Type Chronic pain   Pain Onset More than a month ago   Pain Frequency Intermittent   Aggravating Factors   walking for long periods of time and sitting a lot   Pain Relieving Factors exercise and pain meds     Therapeutic Exercise: To increase core strength to stabilize spine, localize and manage pain to increase pt activity tolerance. Pt supervision with exercises with verbal and tactile cues for sequencing and proper technique.  Stretching: To promote spinal mobility. Cat camel stretch in quadriped: 1 set X10 reps. Ardine Eng pose with cross, midline reaching. 1 set, 2 min. Supine knee raises: 1 set of 10 reps each leg for 10sec each rep.  Exercise: To promote strengthening of the core muscles Quadruped with alternating shoulder flexion:  1 set, 12 reps each arm. Back extensions on green physioball: 2 sets, X8 reps. Supine crow pose (bridge) on green physioball: 1 set, X12 reps. Bow pose with Red physioball between feet with knees flexed to 90 degrees: 1 set, X15 reps. Side balance on green physioball with one knee on mat: 4, 30 sec holds, 2 holds each side.        PT Education - 06/18/15 1407    Education provided Yes   Education Details Pt educated on gym plan for health management post-therapy.    Person(s) Educated Patient   Methods Explanation   Comprehension Verbalized understanding          PT Short Term Goals - 06/18/15 1411    PT  SHORT TERM GOAL #1   Title Pt will initiate HEP for core strengthening and stabilization to decrease pain during functional moblity. (Target Date: 07/06/15)   Time 4   Period Weeks   PT SHORT TERM GOAL #2   Title Will assess modified oswestry disability index and will improve by 6 points to indicate decreased perceived pain during functional mobility.  (Target Date: 07/06/15)   Time 4   Period Weeks   PT SHORT TERM GOAL #3   Title Pt will report no more than 2 point increase in pain during therapy to indicate decreased pain as it relates to mobility and function. (Target Date: 07/06/15)   PT SHORT TERM GOAL #4   Title Pt will report being able to  tolerate 10 mins of activity with no increase in pain to demonstrate decreased pain during ADLs. (Target Date: 07/06/15)   Time 4   Period Weeks           PT Long Term Goals - 06/18/15 1412    PT LONG TERM GOAL #1   Title Pt will be independent with HEP for core stabilization, strengthening and flexibility to indicate improvement in functional mobility. (Target Date: 08/03/15)   Time 8   Period Weeks   PT LONG TERM GOAL #2   Title Pt will increase Modified oswestry disability index by 6 points to indicate pt perceived pain with functional mobility.  (Target Date: 08/03/15)   Time 8   Period Weeks   PT LONG TERM GOAL #3   Title Pt will verbalize initiation of community fitness program in order to indicate continued active lifestyle following therapy to assist in reducing pain. (Target Date: 08/03/15)   Time 8   Period Weeks   PT LONG TERM GOAL #4   Title Pt will tolerate 20 mins of activity with no more than 2 point increase in pain to demonstrate functional return to ADL's with decreased pain. (Target Date: 08/03/15)          Plan - 06/18/15 1408    Clinical Impression Statement Pt progressing towards STG's. Pt with decreased pain during therapy. Pt with increased stiffness on days without exercise. Pt is progressing in exercises to strengthen core on physioballs.   Pt will benefit from skilled therapeutic intervention in order to improve on the following deficits Decreased activity tolerance;Decreased endurance;Decreased mobility;Decreased strength;Difficulty walking;Impaired flexibility;Improper body mechanics;Postural dysfunction;Pain   Rehab Potential Good   PT Frequency 1x / week   PT Duration 8 weeks   PT Treatment/Interventions Electrical Stimulation;Moist Heat;Traction;Ultrasound;Therapeutic activities;Therapeutic exercise;Patient/family education;Manual techniques   PT Next Visit Plan Update HEP with physioball exercises and improve pt understanding of the HEP. Continue  with lumbar sheet traction at the end of treatment as indicated.   PT Home Exercise Plan see pt instruction   Consulted and Agree with Plan of Care Patient        Problem List Patient Active Problem List   Diagnosis Date Noted  . Preventative health care 03/21/2015  . Chronic back pain   . GERD (gastroesophageal reflux disease)   . Hyperlipidemia   . Hypertension   . Episodic mood disorder ([place])   . 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 SMOKER 05/09/2009  . COPD (chronic obstructive pulmonary disease) ([place]) 09/14/2008    Laney Potash 06/18/2015, 2:13 PM  Laney Potash, [place]  Name: JANAE TAUCHER MRN: [id number] Date of Birth: 12/11/1950  This note has been reviewed and edited by supervising CI.  Willow Ora, PTA, [facility] [address] [place], [place] [address] [phone] 06/18/2015, 3:30 PM

## 2015-06-22 ENCOUNTER — Other Ambulatory Visit: Payer: Self-pay | Admitting: Internal Medicine

## 2015-06-22 NOTE — Telephone Encounter (Signed)
05/29/2015 

## 2015-06-23 NOTE — Telephone Encounter (Signed)
Approved: #[id number] x 0 Let her know she is a few days early so shouldn't ask early next time

## 2015-06-25 ENCOUNTER — Other Ambulatory Visit: Payer: Self-pay | Admitting: Internal Medicine

## 2015-06-25 NOTE — Telephone Encounter (Signed)
Spoke with pharmacist and it will not be filled until Friday per pharmacy

## 2015-06-26 ENCOUNTER — Encounter: Payer: Self-pay | Admitting: Physical Therapy

## 2015-06-26 ENCOUNTER — Ambulatory Visit: Payer: 59 | Admitting: Physical Therapy

## 2015-06-26 DIAGNOSIS — M5442 Lumbago with sciatica, left side: Principal | ICD-10-CM

## 2015-06-26 DIAGNOSIS — M5441 Lumbago with sciatica, right side: Secondary | ICD-10-CM

## 2015-06-26 DIAGNOSIS — R6889 Other general symptoms and signs: Secondary | ICD-10-CM

## 2015-06-26 DIAGNOSIS — R29898 Other symptoms and signs involving the musculoskeletal system: Secondary | ICD-10-CM

## 2015-06-26 NOTE — Therapy (Signed)
[facility] [address] [place], [place], [address] Phone: [phone]   Fax:  [phone]  Physical Therapy Treatment  Patient Details  Name: Michelle Park MRN: [id number] Date of Birth: [birth year] Referring Provider: Kary Kos, MD  Encounter Date: 06/26/2015      PT End of Session - 06/26/15 1539    Visit Number 4   Number of Visits 9   Date for PT Re-Evaluation 08/07/15   Authorization Type UHC    PT Start Time 1324   PT Stop Time 1405   PT Time Calculation (min) 41 min   Equipment Utilized During Treatment Gait belt  For physioball exercises.   Activity Tolerance Patient tolerated treatment well   Behavior During Therapy [facility] for tasks assessed/performed      Past Medical History  Diagnosis Date  . Chronic back pain   . Allergic rhinitis due to pollen   . GERD (gastroesophageal reflux disease)   . Hyperlipidemia   . Hypertension   . Episodic mood disorder [facility])     Past Surgical History  Procedure Laterality Date  . Foot surgery  1979 and 1988    There were no vitals filed for this visit.  Visit Diagnosis:  Bilateral low back pain with sciatica, sciatica laterality unspecified  Decreased activity tolerance  Decreased strength of trunk and back      Subjective Assessment - 06/26/15 1527    Subjective Pt. reports a 3/10 pain in low back and buttocks on R and L with shooting pain down L leg.  pt. reports recieving a massage to the lowerback and buttocks over the weekend.     Currently in Pain? Yes   Pain Score 3    Pain Location Buttocks   Pain Orientation Right;Left   Pain Descriptors / Indicators Aching   Pain Type Chronic pain   Pain Onset More than a month ago   Pain Frequency Intermittent   Aggravating Factors  walking for long periods of time and sitting a lot.    Pain Relieving Factors stretching and pain meds.     Multiple Pain Sites No     Treatment (to increase  core/LE/pelvic strength, flexibility, and stability): Exercises: cues on correct ex form/technique provided. Cues for correct hold times, to slow down, for increased muscle activation/strengthening and to decrease use of momentum with exercises. - PROM lower trunk rotation in hook-lying left<>right, 20 sec hold x 2 reps. - PROM piriformis stretch, 30 sec hold x 2 each side. - PROM single knee to chest stretch 30 sec hold each leg - PROM straight leg hamstring stretch 30 sec hold with contract relax technique. - AAROM modified piriformis stretch 2 x 30 sec each leg; verbal cues provided for technique. - AAROM hamstring x 30 sec each leg; verbal cues provided for technique. - LE marching in supine for lower abdominal strengthening 2 x 10 each leg. - bridge with 2 x 10 reps; verbal cues provided for technique.     Quadruped:  - alternating UE/leg raises, contralateral sides, x 10 reps. verbal cues on correct form, for pelvic stability with exercises. - pelvic rocking anterior/posterior pelvic tilts x 10 reps (5 sec hold each way).  * Pt. Arrived 10 minutes late for session so session was short.             PT Short Term Goals - 06/18/15 1411    PT SHORT TERM GOAL #1   Title Pt will initiate HEP for core strengthening  and stabilization to decrease pain during functional moblity. (Target Date: 07/06/15)   Time 4   Period Weeks   PT SHORT TERM GOAL #2   Title Will assess modified oswestry disability index and will improve by 6 points to indicate decreased perceived pain during functional mobility.  (Target Date: 07/06/15)   Time 4   Period Weeks   PT SHORT TERM GOAL #3   Title Pt will report no more than 2 point increase in pain during therapy to indicate decreased pain as it relates to mobility and function. (Target Date: 07/06/15)   PT SHORT TERM GOAL #4   Title Pt will report being able to tolerate 10 mins of activity with no increase in pain to demonstrate decreased pain during ADLs.  (Target Date: 07/06/15)   Time 4   Period Weeks           PT Long Term Goals - 06/18/15 1412    PT LONG TERM GOAL #1   Title Pt will be independent with HEP for core stabilization, strengthening and flexibility to indicate improvement in functional mobility. (Target Date: 08/03/15)   Time 8   Period Weeks   PT LONG TERM GOAL #2   Title Pt will increase Modified oswestry disability index by 6 points to indicate pt perceived pain with functional mobility.  (Target Date: 08/03/15)   Time 8   Period Weeks   PT LONG TERM GOAL #3   Title Pt will verbalize initiation of community fitness program in order to indicate continued active lifestyle following therapy to assist in reducing pain. (Target Date: 08/03/15)   Time 8   Period Weeks   PT LONG TERM GOAL #4   Title Pt will tolerate 20 mins of activity with no more than 2 point increase in pain to demonstrate functional return to ADL's with decreased pain. (Target Date: 08/03/15)               Plan - 06/26/15 1541    Clinical Impression Statement Pt. progressing toward goals.  Pt. reports decreased pain following strengthening and stretching exercises.  Pt. motivated and cooporative but requires instruction throughout therex for proper technique.     Pt will benefit from skilled therapeutic intervention in order to improve on the following deficits Decreased activity tolerance;Decreased endurance;Decreased mobility;Decreased strength;Difficulty walking;Impaired flexibility;Improper body mechanics;Postural dysfunction;Pain   Rehab Potential Good   PT Frequency 1x / week   PT Duration 8 weeks   PT Treatment/Interventions Electrical Stimulation;Moist Heat;Traction;Ultrasound;Therapeutic activities;Therapeutic exercise;Patient/family education;Manual techniques   PT Next Visit Plan Update HEP with physioball exercises and improve pt understanding of the HEP. Continue with lumbar sheet traction at the end of treatment as indicated. Check  STG's next session/week.   PT Home Exercise Plan see pt instruction   Consulted and Agree with Plan of Care Patient        Problem List Patient Active Problem List   Diagnosis Date Noted  . Preventative health care 03/21/2015  . Chronic back pain   . GERD (gastroesophageal reflux disease)   . Hyperlipidemia   . Hypertension   . Episodic mood disorder ([place])   . 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 SMOKER 05/09/2009  . COPD (chronic obstructive pulmonary disease) ([place]) 09/14/2008    Michelle Park 06/26/2015, 3:49 PM  Michelle Park, [place]  Name: Michelle Park MRN: [id number] Date of Birth: 1951-04-23  This note has been reviewed and edited by supervising CI.  Willow Ora, PTA, [place] [address]  [place], [place] [address] [phone] 06/27/2015, 12:25 PM

## 2015-06-26 NOTE — Telephone Encounter (Signed)
Pt called back to ck on status of xanax refill; advised called in on 06/25/15 but cannot pick up until 06/29/15. Spoke with Barnabas Lister at [address] and he said pt could pick up on 06/27/15. Pt voiced understanding and will contact pharmacy on 06/27/15.

## 2015-06-26 NOTE — Patient Instructions (Signed)
Piriformis Stretch, Sitting   Sit, one ankle on opposite knee, same-side hand on crossed knee. Push down on knee, keeping spine straight. Lean torso forward, with flat back, until tension is felt in hamstrings and gluteals of crossed-leg side. Hold 30 seconds.  Repeat 3 times per session. Do 1-2 sessions per day.  Copyright  VHI. All rights reserved.   

## 2015-07-03 ENCOUNTER — Ambulatory Visit: Payer: 59 | Admitting: Physical Therapy

## 2015-07-03 ENCOUNTER — Encounter: Payer: Self-pay | Admitting: Physical Therapy

## 2015-07-03 DIAGNOSIS — M5441 Lumbago with sciatica, right side: Secondary | ICD-10-CM

## 2015-07-03 DIAGNOSIS — M5442 Lumbago with sciatica, left side: Principal | ICD-10-CM

## 2015-07-03 DIAGNOSIS — R29898 Other symptoms and signs involving the musculoskeletal system: Secondary | ICD-10-CM

## 2015-07-03 DIAGNOSIS — R6889 Other general symptoms and signs: Secondary | ICD-10-CM

## 2015-07-03 NOTE — Therapy (Signed)
[facility] [address] [place], [place], [address] Phone: [phone]   Fax:  [phone]  Physical Therapy Treatment  Patient Details  Name: Michelle Park MRN: [id number] Date of Birth: 03-28-1951 Referring Provider: Kary Kos, MD  Encounter Date: 07/03/2015      PT End of Session - 07/03/15 1708    Visit Number 5   Number of Visits 9   Date for PT Re-Evaluation 08/07/15   Authorization Type [employer]    PT Start Time [id number]   PT Stop Time 1358   PT Time Calculation (min) 43 min   Equipment Utilized During Treatment none   Activity Tolerance Patient tolerated treatment well   Behavior During Therapy [facility] for tasks assessed/performed       Past Medical History  Diagnosis Date  . Chronic back pain   . Allergic rhinitis due to pollen   . GERD (gastroesophageal reflux disease)   . Hyperlipidemia   . Hypertension   . Episodic mood disorder [facility])     Past Surgical History  Procedure Laterality Date  . Foot surgery  1979 and 1988    There were no vitals filed for this visit.  Visit Diagnosis:  Bilateral low back pain with sciatica, sciatica laterality unspecified  Decreased activity tolerance  Decreased strength of trunk and back      Subjective Assessment - 07/03/15 1704    Subjective Pt. reports back and L LE radiating pain as a 6/10.  Pt. reports that her pain has increased since last session.     Currently in Pain? Yes   Pain Score 6    Pain Location Back   Pain Orientation Left   Pain Descriptors / Indicators Aching   Pain Type Chronic pain   Pain Radiating Towards into buttocks and L LE   Pain Onset More than a month ago   Pain Frequency Intermittent   Aggravating Factors  exercise.    Pain Relieving Factors stretching and pain meds.    Multiple Pain Sites No       Treatment (to increase core/LE/pelvic strength, flexibility, and stability to decrease pain): Exercises: cues on correct ex  form/technique provided. Cues for correct hold times, to slow down, for increased muscle activation/strengthening and to decrease use of momentum with exercises. - pelvic tilts in supine; verbal cues to "flatten back" to table.   - side-lying clam shells hip abduction with external rotation x 10 reps.   - LE marching in supine for lower abdominal strengthening x 10 each leg. - bridge 2 x 10 reps; verbal cues provided for proper technique.  - Upper abdominal crunches 2 x 10 reps; verbal cues provided for full ROM - AAROM piriformis stretch, 30 sec hold  each side. - AAROM piriformis x 30 sec each leg; verbal cues provided to decrease muscular guarding.  - AAROM single knee to chest 30 sec each leg; verbal cues provided to decrease muscular guarding.  - AAROM double knee to chest 30 sec; verbal cues for hand placement.    PROM stretching in supine on mat (to increase lumbopelvic / LE flexibility to decrease pain and increase activity tolerance): - piriformis x 30 sec each leg; verbal cues provided to decrease muscular guarding.  - single knee to chest 30 sec each leg; verbal cues provided to decrease muscular guarding.  - straight leg hamstring stretch x 30 sec; verbal cues provided to decrease muscular guarding.           PT  Short Term Goals - 07/03/15 1711    PT SHORT TERM GOAL #1   Title Pt will initiate HEP for core strengthening and stabilization to decrease pain during functional moblity. (Target Date: 07/06/15)   Baseline 07/03/2015: pt. reports adherence to HEP for core strengthening and stabilization.    Time 4   Period Weeks   Status Achieved   PT SHORT TERM GOAL #2   Title Will assess modified oswestry disability index and will improve by 6 points to indicate decreased perceived pain during functional mobility.  (Target Date: 07/06/15)   Time 4   Period Weeks   Status On-going   PT SHORT TERM GOAL #3   Title Pt will report no more than 2 point increase in pain during  therapy to indicate decreased pain as it relates to mobility and function. (Target Date: 07/06/15)   Baseline 07/03/2015: Pt. reported a decrease in pain with therapy.     Status Achieved   PT SHORT TERM GOAL #4   Title Pt will report being able to tolerate 10 mins of activity with no increase in pain to demonstrate decreased pain during ADLs. (Target Date: 07/06/15)   Baseline 07/03/2015: Pt. albe to tolerate full treatment with therex with a decrease in pain.     Time 4   Period Weeks   Status Achieved           PT Long Term Goals - 06/18/15 1412    PT LONG TERM GOAL #1   Title Pt will be independent with HEP for core stabilization, strengthening and flexibility to indicate improvement in functional mobility. (Target Date: 08/03/15)   Time 8   Period Weeks   PT LONG TERM GOAL #2   Title Pt will increase Modified oswestry disability index by 6 points to indicate pt perceived pain with functional mobility.  (Target Date: 08/03/15)   Time 8   Period Weeks   PT LONG TERM GOAL #3   Title Pt will verbalize initiation of community fitness program in order to indicate continued active lifestyle following therapy to assist in reducing pain. (Target Date: 08/03/15)   Time 8   Period Weeks   PT LONG TERM GOAL #4   Title Pt will tolerate 20 mins of activity with no more than 2 point increase in pain to demonstrate functional return to ADL's with decreased pain. (Target Date: 08/03/15)           Plan - 07/03/15 1708    Clinical Impression Statement Pt. progressing toward goals.  pt. tolerated treatment well with a decrease in pain following therex and stretching.  Skilled session focused on HEP review with technique and pacing correction.      Pt will benefit from skilled therapeutic intervention in order to improve on the following deficits Decreased activity tolerance;Decreased endurance;Decreased mobility;Decreased strength;Difficulty walking;Impaired flexibility;Improper body  mechanics;Postural dysfunction;Pain   Rehab Potential Good   PT Frequency 1x / week   PT Duration 8 weeks   PT Treatment/Interventions Electrical Stimulation;Moist Heat;Traction;Ultrasound;Therapeutic activities;Therapeutic exercise;Patient/family education;Manual techniques   PT Next Visit Plan Update HEP with physioball exercises.  Continue with with lumbopelvic stretching and core strengthening exercises.  Address remaining STG ([facility]west[facility]).   PT Home Exercise Plan see pt instruction   Consulted and Agree with Plan of Care Patient        Problem List Patient Active Problem List   Diagnosis Date Noted  . Preventative health care 03/21/2015  . Chronic back pain   . GERD (gastroesophageal reflux disease)   .  Hyperlipidemia   . Hypertension   . Episodic mood disorder ([place])   . 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 SMOKER 05/09/2009  . COPD (chronic obstructive pulmonary disease) ([place]) 09/14/2008    Jamarr Treinen 07/04/2015, 1:56 PM  Bess Harvest, [place]  Name: Michelle Park MRN: [id number] Date of Birth: 1950/08/21  This note has been reviewed and edited by supervising CI.  Willow Ora, PTA, [facility] [address], [place], [place] [address] [phone] 07/05/2015, 8:16 AM

## 2015-07-03 NOTE — Patient Instructions (Signed)
Pt. HEP reviewed in session with pt. returning demonstration.  PTA provided correction of form and pacing throughout.

## 2015-07-04 ENCOUNTER — Ambulatory Visit (INDEPENDENT_AMBULATORY_CARE_PROVIDER_SITE_OTHER): Payer: 59 | Admitting: *Deleted

## 2015-07-04 DIAGNOSIS — Z23 Encounter for immunization: Secondary | ICD-10-CM

## 2015-07-10 ENCOUNTER — Encounter: Payer: Self-pay | Admitting: Rehabilitation

## 2015-07-10 ENCOUNTER — Ambulatory Visit: Payer: 59 | Admitting: Rehabilitation

## 2015-07-10 NOTE — Therapy (Signed)
[facility] [address] [place], [place], [address] Phone: [phone]   Fax:  [phone]  Patient Details  Name: Michelle Park MRN: [id number] Date of Birth: 1951-03-24 Referring Provider:  No ref. provider found  Encounter Date: 07/10/2015   PHYSICAL THERAPY DISCHARGE SUMMARY  Visits from Start of Care: 5  Current functional level related to goals / functional outcomes:     PT Long Term Goals - 06/18/15 1412    PT LONG TERM GOAL #1   Title Pt will be independent with HEP for core stabilization, strengthening and flexibility to indicate improvement in functional mobility. (Target Date: 08/03/15)   Time 8   Period Weeks   PT LONG TERM GOAL #2   Title Pt will increase Modified oswestry disability index by 6 points to indicate pt perceived pain with functional mobility.  (Target Date: 08/03/15)   Time 8   Period Weeks   PT LONG TERM GOAL #3   Title Pt will verbalize initiation of community fitness program in order to indicate continued active lifestyle following therapy to assist in reducing pain. (Target Date: 08/03/15)   Time 8   Period Weeks   PT LONG TERM GOAL #4   Title Pt will tolerate 20 mins of activity with no more than 2 point increase in pain to demonstrate functional return to ADL's with decreased pain. (Target Date: 08/03/15)        Remaining deficits: See LTG's above.  Pt requesting to D/C from therapy due to change in insurance, therefore unsure of current functional status.     Education / Equipment: HEP  Plan: Patient agrees to discharge.  Patient goals were not met. Patient is being discharged due to the patient's request.  ?????         Cameron Sprang, PT, MPT [facility] [address] [place], [place], [address] Phone: [phone]   Fax:  [phone] 07/10/2015, 8:24 AM

## 2015-07-17 ENCOUNTER — Ambulatory Visit: Payer: 59 | Admitting: Physical Therapy

## 2015-07-20 ENCOUNTER — Encounter: Payer: Self-pay | Admitting: Internal Medicine

## 2015-07-20 MED ORDER — PANTOPRAZOLE SODIUM 40 MG PO TBEC: 40.0000 mg | DELAYED_RELEASE_TABLET | Freq: Every day | ORAL | Status: DC

## 2015-07-20 NOTE — Telephone Encounter (Signed)
Please send needed Rx for pantoprazole 40mg  daily

## 2015-07-20 NOTE — Telephone Encounter (Signed)
rx sent to pharmacy by e-script  

## 2015-07-24 ENCOUNTER — Ambulatory Visit: Payer: 59 | Admitting: Physical Therapy

## 2015-07-25 ENCOUNTER — Other Ambulatory Visit: Payer: Self-pay

## 2015-07-25 MED ORDER — ESCITALOPRAM OXALATE 20 MG PO TABS: 20.0000 mg | ORAL_TABLET | Freq: Every day | ORAL | Status: DC

## 2015-07-25 MED ORDER — ALPRAZOLAM 0.5 MG PO TABS: 0.5000 mg | ORAL_TABLET | Freq: Two times a day (BID) | ORAL | Status: DC | PRN

## 2015-07-25 NOTE — Telephone Encounter (Signed)
Pt now has [place] complete and request 30 pills of alprazolam (last refilled # 60 on 06/25/15) and escitalopram (last filled # 90 x 1 on 12/18/14) to [address]. Last annual 03/21/15.

## 2015-07-25 NOTE — Telephone Encounter (Signed)
rx called into pharmacy rx sent to pharmacy by e-script  

## 2015-07-25 NOTE — Telephone Encounter (Signed)
Approved: alprazolam #30 x 0 escitalopram for a year

## 2015-07-30 ENCOUNTER — Other Ambulatory Visit: Payer: Self-pay | Admitting: Internal Medicine

## 2015-07-30 ENCOUNTER — Encounter: Payer: Self-pay | Admitting: Internal Medicine

## 2015-07-31 ENCOUNTER — Ambulatory Visit: Payer: 59 | Admitting: Rehabilitation

## 2015-08-01 ENCOUNTER — Ambulatory Visit (INDEPENDENT_AMBULATORY_CARE_PROVIDER_SITE_OTHER): Payer: Medicare Other | Admitting: Internal Medicine

## 2015-08-01 ENCOUNTER — Encounter: Payer: Self-pay | Admitting: Internal Medicine

## 2015-08-01 ENCOUNTER — Other Ambulatory Visit: Payer: Self-pay | Admitting: Internal Medicine

## 2015-08-01 VITALS — BP 148/90 | HR 74 | Temp 98.6°F | Wt 220.0 lb

## 2015-08-01 DIAGNOSIS — M79643 Pain in unspecified hand: Secondary | ICD-10-CM | POA: Insufficient documentation

## 2015-08-01 DIAGNOSIS — M79642 Pain in left hand: Secondary | ICD-10-CM | POA: Diagnosis not present

## 2015-08-01 LAB — URIC ACID: URIC ACID, SERUM: 4.6 mg/dL (ref 2.4–7.0)

## 2015-08-01 NOTE — Progress Notes (Signed)
   Subjective:    Patient ID: Michelle Park, female    DOB: [birth year], 64 y.o.   MRN: [id number]  HPI Here due to hand pain  Trouble with both hands at times Started first 10 days ago or so 3 nights ago--left hand started to hurt Swelled after dinner at [facility] May be related to wine or beer Seems to be in joints--- 1st [facility]  Never diagnosed with gout No other joint involvement Has tried ibuprofen 400mg  with some help (eases it a bit)  Current Outpatient Prescriptions on File Prior to Visit  Medication Sig Dispense Refill  . ALPRAZolam (XANAX) 0.5 MG tablet Take 1 tablet (0.5 mg total) by mouth 2 (two) times daily as needed. 30 tablet 0  . calcium-vitamin D (OSCAL WITH D) 500-200 MG-UNIT per tablet 1500 mg per day for low bone density    . escitalopram (LEXAPRO) 20 MG tablet Take 1 tablet (20 mg total) by mouth daily. 90 tablet 3  . losartan-hydrochlorothiazide (HYZAAR) 100-25 MG per tablet TAKE 1 TABLET BY MOUTH EVERY DAY 90 tablet 3  . Multiple Vitamin (MULTIVITAMIN) capsule Take 1 capsule by mouth daily.    . pantoprazole (PROTONIX) 40 MG tablet Take 1 tablet (40 mg total) by mouth daily. 30 tablet 3  . traMADol (ULTRAM) 50 MG tablet Take 50 mg by mouth every 6 (six) hours as needed.     No current facility-administered medications on file prior to visit.    Allergies  Allergen Reactions  . Aspirin   . Atorvastatin     REACTION: causes leg pain  . Rosuvastatin Other (See Comments)    Myalgias    Past Medical History  Diagnosis Date  . Chronic back pain   . Allergic rhinitis due to pollen   . GERD (gastroesophageal reflux disease)   . Hyperlipidemia   . Hypertension   . Episodic mood disorder [facility])     Past Surgical History  Procedure Laterality Date  . Foot surgery  1979 and 1988    Family History  Problem Relation Age of Onset  . Heart disease Mother   . Alzheimer's disease Mother   . Cancer Sister     Social History   Social History  .  Marital Status: Divorced    Spouse Name: N/A  . Number of Children: 1  . Years of Education: N/A   Occupational History  . [employer] IT specialist     Retired   Social History Main Topics  . Smoking status: Current Every Day Smoker -- 1.00 packs/day for 45 years    Types: Cigarettes  . Smokeless tobacco: Never Used  . Alcohol Use: 0.0 oz/week    0 Standard drinks or equivalent per week     Comment: rare glass of wine  . Drug Use: No  . Sexual Activity: Not on file   Other Topics Concern  . Not on file   Social History Narrative   Goes by [employer].   1 daughter   Review of Systems Still working with Dr Saintclair Halsted for neck issues, etc Going to PT for this    Objective:   Physical Exam  Musculoskeletal:  No hand swelling No joint effusions, inflammation or tenderness          Assessment & Plan:

## 2015-08-01 NOTE — Assessment & Plan Note (Signed)
No findings now No evidence of inflammatory arthritis and no recent trauma--so will hold off on x-rays This could be mild gout with flares after rich food and alcohol---will check uric acid If ongoing flares--will try colchicine and stop HCTZ

## 2015-08-01 NOTE — Progress Notes (Signed)
Pre visit review using our clinic review tool, if applicable. No additional management support is needed unless otherwise documented below in the visit note. 

## 2015-08-01 NOTE — Telephone Encounter (Signed)
[facility] left v/m requesting cb about lexapro refill; I called [facility] and issue was resolved; nothing needed.

## 2015-08-07 ENCOUNTER — Encounter: Payer: Self-pay | Admitting: Internal Medicine

## 2015-08-14 ENCOUNTER — Encounter: Payer: Self-pay | Admitting: Internal Medicine

## 2015-08-14 MED ORDER — OMEPRAZOLE 40 MG PO CPDR: 40.0000 mg | DELAYED_RELEASE_CAPSULE | Freq: Every day | ORAL | Status: DC

## 2015-08-20 ENCOUNTER — Other Ambulatory Visit: Payer: Self-pay | Admitting: Internal Medicine

## 2015-08-20 NOTE — Telephone Encounter (Signed)
rx called into pharmacy

## 2015-08-20 NOTE — Telephone Encounter (Signed)
Approved: #[id number] x 0 

## 2015-08-20 NOTE — Telephone Encounter (Signed)
07/25/2015 

## 2015-09-06 ENCOUNTER — Ambulatory Visit (INDEPENDENT_AMBULATORY_CARE_PROVIDER_SITE_OTHER): Payer: Medicare Other | Admitting: Internal Medicine

## 2015-09-06 ENCOUNTER — Encounter: Payer: Self-pay | Admitting: Internal Medicine

## 2015-09-06 VITALS — BP 140/80 | HR 81 | Temp 98.4°F | Wt 222.0 lb

## 2015-09-06 DIAGNOSIS — J0110 Acute frontal sinusitis, unspecified: Secondary | ICD-10-CM | POA: Insufficient documentation

## 2015-09-06 MED ORDER — LOSARTAN POTASSIUM-HCTZ 100-25 MG PO TABS: 1.0000 | ORAL_TABLET | Freq: Every day | ORAL | Status: DC

## 2015-09-06 MED ORDER — AMOXICILLIN 500 MG PO TABS: 1000.0000 mg | ORAL_TABLET | Freq: Two times a day (BID) | ORAL | Status: DC

## 2015-09-06 NOTE — Patient Instructions (Signed)
Please try taking the allegra daily again to see if that helps.

## 2015-09-06 NOTE — Progress Notes (Signed)
Subjective:    Patient ID: Michelle Park, female    DOB: [birth year], 65 y.o.   MRN: [id number]  HPI Here due to bad congestion and sinus headaches Lots of drainage to throat and cough Trouble swallowing at night--?secondary to drainage Hoarse at times Goes back weeks Feels lousy --due to frontal pain  No fever Cough productive of yellow and now clear mucus Slight SOB Still smokes--- not ready to stop Some sweats at night--- not new  Does have some allergy symptoms---but vague Used to take allergy meds in past Oil heat--home is ~66 years old. Never had ducts cleaned Will be moving next month  Current Outpatient Prescriptions on File Prior to Visit  Medication Sig Dispense Refill  . ALPRAZolam (XANAX) 0.5 MG tablet TAKE 1 TABLET BY MOUTH TWICE A DAY AS NEEDED 60 tablet 0  . calcium-vitamin D (OSCAL WITH D) 500-200 MG-UNIT per tablet 1500 mg per day for low bone density    . escitalopram (LEXAPRO) 20 MG tablet Take 1 tablet (20 mg total) by mouth daily. 90 tablet 3  . Multiple Vitamin (MULTIVITAMIN) capsule Take 1 capsule by mouth daily.    Marland Kitchen omeprazole (PRILOSEC) 40 MG capsule Take 1 capsule (40 mg total) by mouth daily. 30 capsule 3  . traMADol (ULTRAM) 50 MG tablet Take 50 mg by mouth every 6 (six) hours as needed.     No current facility-administered medications on file prior to visit.    Allergies  Allergen Reactions  . Aspirin   . Atorvastatin     REACTION: causes leg pain  . Rosuvastatin Other (See Comments)    Myalgias    Past Medical History  Diagnosis Date  . Chronic back pain   . Allergic rhinitis due to pollen   . GERD (gastroesophageal reflux disease)   . Hyperlipidemia   . Hypertension   . Episodic mood disorder [facility])     Past Surgical History  Procedure Laterality Date  . Foot surgery  1979 and 1988    Family History  Problem Relation Age of Onset  . Heart disease Mother   . Alzheimer's disease Mother   . Cancer Sister     Social  History   Social History  . Marital Status: Divorced    Spouse Name: N/A  . Number of Children: 1  . Years of Education: N/A   Occupational History  . [employer] IT specialist     Retired   Social History Main Topics  . Smoking status: Current Every Day Smoker -- 1.00 packs/day for 45 years    Types: Cigarettes  . Smokeless tobacco: Never Used  . Alcohol Use: 0.0 oz/week    0 Standard drinks or equivalent per week     Comment: rare glass of wine  . Drug Use: No  . Sexual Activity: Not on file   Other Topics Concern  . Not on file   Social History Narrative   Goes by [employer].   1 daughter   Review of Systems Concerned about bone density Was"low" 2 years ago--- reviewed report and spine normal/hip only -1.2    Objective:   Physical Exam  Constitutional: She appears well-developed and well-nourished. No distress.  HENT:  Mouth/Throat: Oropharynx is clear and moist.  Moderate frontal sinus tenderness--mild maxillary Moderate nasal inflammation TMs normal  Neck: Normal range of motion. Neck supple. No thyromegaly present.  Pulmonary/Chest: Effort normal and breath sounds normal. No respiratory distress. She has no wheezes. She has no rales.  Lymphadenopathy:  She has no cervical adenopathy.          Assessment & Plan:

## 2015-09-06 NOTE — Progress Notes (Signed)
Pre visit review using our clinic review tool, if applicable. No additional management support is needed unless otherwise documented below in the visit note. 

## 2015-09-06 NOTE — Assessment & Plan Note (Signed)
Hard to tell if infectious or allergic Will try amoxil and told her to try the allegra again Urged her to at least cut down on smoking

## 2015-09-10 ENCOUNTER — Telehealth: Payer: Self-pay | Admitting: Internal Medicine

## 2015-09-10 MED ORDER — OMEPRAZOLE 40 MG PO CPDR: 40.0000 mg | DELAYED_RELEASE_CAPSULE | Freq: Every day | ORAL | Status: DC

## 2015-09-10 NOTE — Telephone Encounter (Signed)
Patient Name: Michelle Park DOB: 18-Jan-1951 Initial Comment Caller states c/o hand swelling, requests Rx, also needs acid reflux meds refilled Nurse Assessment Nurse: Ronnald Ramp, RN, Miranda Date/Time (Eastern Time): 09/10/2015 2:19:35 PM Confirm and document reason for call. If symptomatic, describe symptoms. You must click the next button to save text entered. ---Caller states she has on-going pain and swelling in her left hand. She has had a flair up since Sunday. Has the patient traveled out of the country within the last 30 days? ---Not Applicable Does the patient have any new or worsening symptoms? ---Yes Will a triage be completed? ---Yes Related visit to physician within the last 2 weeks? ---No Does the PT have any chronic conditions? (i.e. diabetes, asthma, etc.) ---Yes List chronic conditions. ---HTN, GERD Is this a behavioral health or substance abuse call? ---No Guidelines Guideline Title Affirmed Question Affirmed Notes Hand and Wrist Pain Hand or wrist pain is a chronic symptom (recurrent or ongoing AND present > 4 weeks) Final Disposition User See PCP within 2 Ronny Flurry, RN, Miranda Comments Caller is wanting to know if the doctor will call her in some steroids to help with the pain. Told caller that the MD made note of maybe adjusting some medication or other prescriptions to try but did not mention steroids. Caller does not want to have to come back in to be seen. Told caller I would send a note with her request to the doctor and ask that he call her back. Disagree/Comply: Comply

## 2015-09-10 NOTE — Telephone Encounter (Signed)
Okay to refill the acid med  Can send colchicine 0.6mg  for bid prn #60 x 1 (in case this is gout). If this helps right away, it probably is gout and she will need to stop the HCTZ. If she does, set up follow up in 1-2 months to see how BP is

## 2015-09-10 NOTE — Telephone Encounter (Signed)
Spoke with patient and she's very hesitant on the gout medication since her uric acid level was normal and pt states she doesn't want to stop the BP medication. Pt states her brother took gout medication and it gave him diarrhea. Pt doesn't think this will help. Please advise  rx sent to pharmacy by e-script omeprazole

## 2015-09-10 NOTE — Telephone Encounter (Signed)
I don't know what else to try if it isn't the gout. I can send her for an evaluation at a rheumatologist if she wishes

## 2015-09-11 NOTE — Telephone Encounter (Signed)
Patient notified as instructed by telephone and verbalized understanding. Patient stated that she does not want to come off of her BP medication. Patient stated that she has seen a rheumatologist in the past and they did not do anything for her. Patient stated that she was diagnosed by Dr. Zadie Rhine 2014 with fibromyalgia and thinks that is what is going on now. Patient stated that she may need to go back on Cymbalta unless there is something else that you think would work better for her.

## 2015-09-11 NOTE — Telephone Encounter (Signed)
Probably not if it is fibromyalgia--though this is not generally just in hands Have her let me know if she wants the cymbalta again--and find out what dose she was on

## 2015-09-11 NOTE — Telephone Encounter (Signed)
Left message on voice mail  to call back

## 2015-09-12 NOTE — Telephone Encounter (Signed)
.  left message to have patient return my call.  

## 2015-09-14 NOTE — Telephone Encounter (Signed)
.  left message to have patient return my call.  

## 2015-09-18 ENCOUNTER — Other Ambulatory Visit: Payer: Self-pay | Admitting: Internal Medicine

## 2015-09-18 NOTE — Telephone Encounter (Signed)
08/20/2015 

## 2015-09-19 NOTE — Telephone Encounter (Signed)
Approved: #[id number] x 0 

## 2015-09-19 NOTE — Telephone Encounter (Signed)
rx called into pharmacy

## 2015-10-10 ENCOUNTER — Telehealth: Payer: Self-pay | Admitting: Internal Medicine

## 2015-10-10 NOTE — Telephone Encounter (Signed)
Patient Name: MAKAYDEN REDBIRD DOB: Sep 05, 1950 Initial Comment Caller states she's having swelling in both hands, and some shoulder pan. Still has a sinus infection. Nurse Assessment Nurse: Marcelline Deist, RN, Lynda Date/Time (Eastern Time): 10/10/2015 11:05:05 AM Confirm and document reason for call. If symptomatic, describe symptoms. You must click the next button to save text entered. ---Caller states she's having swelling in both hands, and some shoulder pan. Still has a sinus infection & cough. Seen for that earlier this year, taking allergy medication, had used an antibiotic. Diagnosed with fibromyalgia, had been on different rxs. - Cymbalta & Lyrica. Has had tests done on her hands. Has had the swelling of hands for about 4 years, had been on strong anti inflammatory for a long time. Has been under a lot of stress. Her records that were supposed to be transferred over from [facility], but were not. Saw Dr. Roxanne Gates. Dr. Silvio Pate wanted to put her on gout rx, take her off of BP rx. Has the patient traveled out of the country within the last 30 days? ---Not Applicable Does the patient have any new or worsening symptoms? ---Yes Will a triage be completed? ---Yes Related visit to physician within the last 2 weeks? ---No Does the PT have any chronic conditions? (i.e. diabetes, asthma, etc.) ---Yes List chronic conditions. ---fibromyalgia, was on anti depressant, takes rx for insomnia, on BP rx Is this a behavioral health or substance abuse call? ---No Guidelines Guideline Title Affirmed Question Affirmed Notes Shoulder Pain [1] Shoulder pains with exertion (e.g., walking) AND [2] pain goes away on resting AND [3] not present now Final Disposition User See Physician within 4 Hours (or PCP triage) Marcelline Deist, RN, Kermit Balo Comments There is no availability on schedule for an appt. today, so scheduled tomorrow with her Dr. She has had ongoing issues with different areas of her body. had bad  shoulder pain in her right shoulder last night. Has ongoing swelling of hands, and states sinus symptoms have not completely resolved. She also mentioned that she was diagnosed with COPD & has back problems as well. Will sign another release form at [facility] so they can transfer her medical records over to [facility]. PL EASE NOTE: All timestamps contained within this report are represented as [place] Standard Time. CONFIDENTIALTY NOTICE: This fax transmission is intended only for the addressee. It contains information that is legally privileged, confidential or otherwise protected from use or disclosure. If you are not the intended recipient, you are strictly prohibited from reviewing, disclosing, copying using or disseminating any of this information or taking any action in reliance on or regarding this information. If you have received this fax in error, please notify us immediately by telephone so that we can arrange for its return to [place]. Phone: [phone], Toll-Free: [phone], Fax: [phone] Page: 2 of 2 Call Id: [id number] Referrals REFERRED TO PCP OFFICE Disagree/Comply: Leta Baptist

## 2015-10-10 NOTE — Telephone Encounter (Signed)
May need referral to rheumatology but we can discuss at the visit

## 2015-10-10 NOTE — Telephone Encounter (Signed)
Pt has appt with Dr Silvio Pate on 10/11/15 at 4:30.

## 2015-10-11 ENCOUNTER — Ambulatory Visit (INDEPENDENT_AMBULATORY_CARE_PROVIDER_SITE_OTHER): Payer: Medicare Other | Admitting: Internal Medicine

## 2015-10-11 ENCOUNTER — Encounter: Payer: Self-pay | Admitting: Internal Medicine

## 2015-10-11 VITALS — BP 138/80 | HR 85 | Temp 98.2°F | Wt 223.2 lb

## 2015-10-11 DIAGNOSIS — M797 Fibromyalgia: Secondary | ICD-10-CM

## 2015-10-11 MED ORDER — CYCLOBENZAPRINE HCL 10 MG PO TABS: 5.0000 mg | ORAL_TABLET | Freq: Every evening | ORAL | Status: DC | PRN

## 2015-10-11 NOTE — Patient Instructions (Signed)
Please restart regular water exercises. If you try the cyclobenzaprine--don't use the alprazolam at the same.

## 2015-10-11 NOTE — Progress Notes (Signed)
   Subjective:    Patient ID: Michelle Park, female    DOB: October 01, 1950, 65 y.o.   MRN: [id number]  HPI Here for ongoing joint pain  Did see Dr Jolee Ewing in 2014 Got flexeril/anti inflammatories Had an elevated rheumatoid factor  Since her last visit--has had swelling in both wrists and thumbs. More on right Right arm pain also---upper arm--below shoulder Also left upper arm Now in neck also--trouble moving her neck  Told by Dr Saintclair Halsted that she needs surgery on neck and back Degenerative discs in neck and low back  Current Outpatient Prescriptions on File Prior to Visit  Medication Sig Dispense Refill  . ALPRAZolam (XANAX) 0.5 MG tablet TAKE 1 TABLET TWICE A DAY AS NEEDED 60 tablet 0  . calcium-vitamin D (OSCAL WITH D) 500-200 MG-UNIT per tablet 1500 mg per day for low bone density    . escitalopram (LEXAPRO) 20 MG tablet Take 1 tablet (20 mg total) by mouth daily. 90 tablet 3  . losartan-hydrochlorothiazide (HYZAAR) 100-25 MG tablet Take 1 tablet by mouth daily. 90 tablet 3  . Multiple Vitamin (MULTIVITAMIN) capsule Take 1 capsule by mouth daily.    Marland Kitchen omeprazole (PRILOSEC) 40 MG capsule Take 1 capsule (40 mg total) by mouth daily. 90 capsule 3   No current facility-administered medications on file prior to visit.    Allergies  Allergen Reactions  . Aspirin   . Atorvastatin     REACTION: causes leg pain  . Rosuvastatin Other (See Comments)    Myalgias    Past Medical History  Diagnosis Date  . Chronic back pain   . Allergic rhinitis due to pollen   . GERD (gastroesophageal reflux disease)   . Hyperlipidemia   . Hypertension   . Episodic mood disorder [facility])     Past Surgical History  Procedure Laterality Date  . Foot surgery  1979 and 1988    Family History  Problem Relation Age of Onset  . Heart disease Mother   . Alzheimer's disease Mother   . Cancer Sister     Social History   Social History  . Marital Status: Divorced    Spouse Name: N/A  . Number of  Children: 1  . Years of Education: N/A   Occupational History  . [employer] IT specialist     Retired   Social History Main Topics  . Smoking status: Current Every Day Smoker -- 1.00 packs/day for 45 years    Types: Cigarettes  . Smokeless tobacco: Never Used  . Alcohol Use: 0.0 oz/week    0 Standard drinks or equivalent per week     Comment: rare glass of wine  . Drug Use: No  . Sexual Activity: Not on file   Other Topics Concern  . Not on file   Social History Narrative   Goes by [employer].   1 daughter   Review of Systems No distinct hand weakness--no loss of grip strength No fevers Appetite is fine No weight loss    Objective:   Physical Exam  Constitutional: She appears well-developed and well-nourished. No distress.  Neck:  Some pain with ROM but moves fairly fully  Musculoskeletal:  Tenderness at shoulders--- tendon insertions No synovitis           Assessment & Plan:

## 2015-10-11 NOTE — Assessment & Plan Note (Addendum)
Has been vague with me but finally tells me that her former PCP ([place]) and Dr Jolee Ewing diagnosed her with this Clearly doesn't seem to have joint specific symptoms Her pain doesn't seem to be related to her known cervical disc disease Borderline sleep apnea--but not enough to warrant CPAP Some question of RLS Already on meloxicam Has had exercise regimens by ortho---not really doing any exercise though Extensive PT for back--may have helped some Did do well with water therapy--- discussed restarting water exercise Dr Saintclair Halsted recommended pain management at his office--but very high copayments so she is reluctant 25 minute visit --- 15 minutes in direct care planning

## 2015-10-22 ENCOUNTER — Other Ambulatory Visit: Payer: Self-pay | Admitting: Internal Medicine

## 2015-10-22 NOTE — Telephone Encounter (Signed)
Approved: #[id number] x 0 

## 2015-10-22 NOTE — Telephone Encounter (Signed)
Last rx 09-19-15 #60. Last OV 10-21-15. Next OV 03-25-16

## 2015-10-23 NOTE — Telephone Encounter (Signed)
Left refill on voice mail at pharmacy  

## 2015-11-20 DIAGNOSIS — M542 Cervicalgia: Secondary | ICD-10-CM | POA: Diagnosis not present

## 2015-11-23 ENCOUNTER — Other Ambulatory Visit: Payer: Self-pay | Admitting: Internal Medicine

## 2015-11-23 NOTE — Telephone Encounter (Signed)
Left refill on voice mail at pharmacy Spoke to patient

## 2015-11-23 NOTE — Telephone Encounter (Signed)
Pt left /vm requesting cb about status of xanax refill. Pt request to be refilled 11/23/15.

## 2015-11-23 NOTE — Telephone Encounter (Signed)
Last filled 10-23-15 #60/0. Last OV 10-11-15 Next OV 03-25-16

## 2015-11-23 NOTE — Telephone Encounter (Signed)
Approved: #[id number] x 0 

## 2015-12-13 ENCOUNTER — Telehealth: Payer: Self-pay

## 2015-12-13 ENCOUNTER — Ambulatory Visit (HOSPITAL_COMMUNITY)
Admission: EM | Admit: 2015-12-13 | Discharge: 2015-12-13 | Disposition: A | Discharge status: Home / Self Care | Payer: Medicare Other | Attending: Family Medicine | Admitting: Family Medicine

## 2015-12-13 ENCOUNTER — Encounter (HOSPITAL_COMMUNITY): Payer: Self-pay | Admitting: Emergency Medicine

## 2015-12-13 ENCOUNTER — Telehealth: Payer: Self-pay | Admitting: Internal Medicine

## 2015-12-13 DIAGNOSIS — I10 Essential (primary) hypertension: Secondary | ICD-10-CM

## 2015-12-13 NOTE — Telephone Encounter (Signed)
Please call her tomorrow. Urgent is particularly not suited to decisions about ongoing care--but okay if she really didn't feel right. See if we can get records and find out what happened.

## 2015-12-13 NOTE — Telephone Encounter (Signed)
NP from [facility], Abigail Butts, went out to see the patient for her annual check. The patient's BP was elevated at 164/102. The patient stated she had been having "sinus headaches" recently, but not today. The patient has not been monitoring her BP at home, states she takes her losartan everyday, and is nonsymptomatic.The NP also states she heard bilateral carotid bruits. I advised both of them that Dr Silvio Pate was out of the office until this afternoon. I will advise the patient what the next step is.

## 2015-12-13 NOTE — Telephone Encounter (Signed)
Phone call from Linde Gillis PA She is very angry that we didn't work her in Nurse from [facility] told her she needed to be seen today--due to severe high blood pressure, bruits (but may just be radiation of heart murmur) BP 173/93 No headache or other symptoms She is threatening to sue over this entire picture  Vincente Liberty and Vaughan Basta, Please call for damage control I told him to let her know that I would see her Monday afternoon---please give her an appointment

## 2015-12-13 NOTE — Discharge Instructions (Signed)
I have had a chance to discuss with your doctor your medical concerns for today.   He is sorry he is not able to see you tomorrow but he is double booked all day  He will see you Monday afternoon, and address all issues with you at that time.  I understand that you are upset that you were not seen by your doctor today, and that the co-pay is higher than your PCP;s office. I hope talking to management has helped resolve at least part of your concerns.   There are no medical issues found today that need emergent attention.   Continue to take your current medication, and if this needs to be changed, your doctor can do this next week.

## 2015-12-13 NOTE — ED Notes (Signed)
Pt was seen today by her insurance RN for an annual assessment at her home.  Pt reports RN found her with HBP, a heart murmur, and "could hear her carotid artery was clogged".  Pt is asymptomatic today, she is calm, and appears to be in no distress.  She was informed that checking her carotid artery would need to be done at the hospital and she does say she has a PCP.  Pt does not want to go to the hospital.  She does report not "feeling well" for the last few weeks.  She clarified by stating she hasn't been sleeping but she does suffer from RLS.

## 2015-12-13 NOTE — Telephone Encounter (Signed)
Have her set up appt tomorrow or early next week to review BP and symptoms

## 2015-12-13 NOTE — ED Notes (Signed)
Pt was seen by Latrelle Dodrill, RN and chose to leave after her meeting with her, without AVS or signing discharge.  She did not leave AMA.

## 2015-12-13 NOTE — Telephone Encounter (Signed)
Patient called back requesting an appointment today.  I advised her of Dr. Alla German comments and offered an appointment for tomorrow.  Michelle Park preferred to be seen today and agreed to go to urgent care.

## 2015-12-13 NOTE — ED Provider Notes (Signed)
CSN: [id number]     Arrival date & time 12/13/15  1322 History   First MD Initiated Contact with Patient 12/13/15 1453     Chief Complaint  Patient presents with  . Hypertension   (Consider location/radiation/quality/duration/timing/severity/associated sxs/prior Treatment) HPI Patient is a 65 year old female that was visited by a [place] Risk analyst today. Patient was told that she needed to be seen urgently because her blood pressure was extremely high. She was also told that she had bilateral carotid bruits which could mean potential stroke and she was advised that she had a heart murmur. Patient states that she called her primary care provider to discuss these issues and was told that she could not be seen today as there were no appointments. Patient states that she did not ask for a future appointment. Patient states that she is extremely angry and frustrated at this time. She is also very upset at the fact that she had to pay a potential $30 co-pay for visit to [facility] urgent care when she could've payday $5 co-pay when another urgent care. Patient states that she is having no symptoms at this time. She states also that her doctor does not take her seriously and she feels that he has the impression that she is making most of her symptoms up. I have asked the patient directly what issues he would like me to tell with at this time. I have explained to her that as far as the carotid bruits and the murmur we do not have skin or studies here in the urgent care or echocardiogram and she would need to be referred to a specialist for the tests.  Past Medical History  Diagnosis Date  . Chronic back pain   . Allergic rhinitis due to pollen   . GERD (gastroesophageal reflux disease)   . Hyperlipidemia   . Hypertension   . Episodic mood disorder [facility])    Past Surgical History  Procedure Laterality Date  . Foot surgery  1979 and 1988   Family History  Problem  Relation Age of Onset  . Heart disease Mother   . Alzheimer's disease Mother   . Cancer Sister    Social History  Substance Use Topics  . Smoking status: Current Every Day Smoker -- 1.00 packs/day for 45 years    Types: Cigarettes  . Smokeless tobacco: Never Used  . Alcohol Use: 0.0 oz/week    0 Standard drinks or equivalent per week     Comment: rare glass of wine   OB History    No data available     Review of Systems ROS +'ve multiple complaints including high blood pressure, in need of ancillary testing  Denies: HEADACHE, NAUSEA, ABDOMINAL PAIN, CHEST PAIN, CONGESTION, DYSURIA, SHORTNESS OF BREATH  Allergies  Aspirin; Atorvastatin; and Rosuvastatin  Home Medications   Prior to Admission medications   Medication Sig Start Date End Date Taking? Authorizing Provider  ALPRAZolam Duanne Moron) 0.5 MG tablet TAKE 1 TABLET BY MOUTH TWICE A DAY AS NEEDED 11/23/15  Yes Venia Carbon, MD  calcium-vitamin D (OSCAL WITH D) 500-200 MG-UNIT per tablet 1500 mg per day for low bone density   Yes Historical Provider, MD  cyclobenzaprine (FLEXERIL) 10 MG tablet Take 0.5-1 tablets (5-10 mg total) by mouth at bedtime as needed for muscle spasms. 10/11/15  Yes Venia Carbon, MD  escitalopram (LEXAPRO) 20 MG tablet Take 1 tablet (20 mg total) by mouth daily. 07/25/15  Yes Venia Carbon, MD  losartan-hydrochlorothiazide (HYZAAR) 100-25 MG tablet Take 1 tablet by mouth daily. 09/06/15  Yes Venia Carbon, MD  meloxicam (MOBIC) 7.5 MG tablet Take 7.5 mg by mouth 2 (two) times daily.   Yes Historical Provider, MD  Multiple Vitamin (MULTIVITAMIN) capsule Take 1 capsule by mouth daily.   Yes Historical Provider, MD  omeprazole (PRILOSEC) 40 MG capsule Take 1 capsule (40 mg total) by mouth daily. 09/10/15  Yes Venia Carbon, MD  traMADol (ULTRAM) 50 MG tablet Take by mouth every 6 (six) hours as needed.   Yes Historical Provider, MD   Meds Ordered and Administered this Visit  Medications - No data  to display  BP 173/93 mmHg  Pulse 79  Temp(Src) 98.2 F (36.8 C) (Oral)  SpO2 98% No data found.   Physical Exam NURSES NOTES AND VITAL SIGNS REVIEWED. CONSTITUTIONAL: Well developed, well nourished, no acute distress HEENT: normocephalic, atraumatic EYES: Conjunctiva normal NECK:normal ROM, supple, no adenopathy CARDIO: murmur is noted right upper sternal border.  No bruits, but radiation of murmur sound to carotids.  PULMONARY:No respiratory distress, normal effort ABDOMINAL: Soft, ND, NT BS+, No CVAT MUSCULOSKELETAL: Normal ROM of all extremities,  SKIN: warm and dry without rash PSYCHIATRIC: Mood and affect, behavior are normal  ED Course  Procedures (including critical care time)  Labs Review Labs Reviewed - No data to display  Imaging Review No results found.   Visual Acuity Review  Right Eye Distance:   Left Eye Distance:   Bilateral Distance:    Right Eye Near:   Left Eye Near:    Bilateral Near:     Discussion with primary care doctor. He states that he will  make an appointment for the patient for Monday. I have advised primary care provider did I did not find any pathology that required  urgent referral. Patient is asymptomatic at this time. I will also advise patient that that would be no change in her medication regimen at this time. I have also reviewed all blood pressures from office visits with the patient. Patient wanted to speak with me also about reimbursement of her co-pay I have advised her that that is not at my   Total Visit Time:30 MINUTES "GREATER THAN 50% WAS SPENT IN COUNSELING AND COORDINATION OF CARE WITH THE PATIENT" DISCUSSION OF HER CONCERNS WITH FINDINGS OF THE NP, AND THE FACT THAT HER DOCTOR DID NOT MAKE TIME TO SEE HER TODAY.   PT HAS REQUESTED TO SEE MANAGER OF UC.  MDM   1. Essential hypertension     Patient is reassured that there are no issues that require transfer to higher level of care at this time or additional  tests. Patient is advised to continue home symptomatic treatment. Patient is advised that if there are new or worsening symptoms to attend the emergency department, contact primary care provider, or return to UC. Instructions of care provided discharged home in stable condition.    THIS NOTE WAS GENERATED USING A VOICE RECOGNITION SOFTWARE PROGRAM. ALL REASONABLE EFFORTS  WERE MADE TO PROOFREAD THIS DOCUMENT FOR ACCURACY.  I have verbally reviewed the discharge instructions with the patient. A printed AVS was given to the patient.  All questions were answered prior to discharge.      Konrad Felix, [place] 12/13/15 [phone]

## 2015-12-17 ENCOUNTER — Encounter: Payer: Self-pay | Admitting: Internal Medicine

## 2015-12-17 ENCOUNTER — Ambulatory Visit (INDEPENDENT_AMBULATORY_CARE_PROVIDER_SITE_OTHER): Payer: Medicare Other | Admitting: Internal Medicine

## 2015-12-17 VITALS — BP 132/78 | HR 82 | Temp 97.5°F | Wt 212.0 lb

## 2015-12-17 DIAGNOSIS — IMO0001 Reserved for inherently not codable concepts without codable children: Secondary | ICD-10-CM | POA: Insufficient documentation

## 2015-12-17 DIAGNOSIS — I10 Essential (primary) hypertension: Secondary | ICD-10-CM

## 2015-12-17 DIAGNOSIS — I700 Atherosclerosis of aorta: Secondary | ICD-10-CM | POA: Diagnosis not present

## 2015-12-17 DIAGNOSIS — J449 Chronic obstructive pulmonary disease, unspecified: Secondary | ICD-10-CM | POA: Diagnosis not present

## 2015-12-17 MED ORDER — METOPROLOL SUCCINATE ER 25 MG PO TB24: 25.0000 mg | ORAL_TABLET | Freq: Every day | ORAL | Status: DC

## 2015-12-17 NOTE — Telephone Encounter (Signed)
She is coming in today and the UC notes are in the system

## 2015-12-17 NOTE — Assessment & Plan Note (Signed)
BP Readings from Last 3 Encounters:  12/17/15 132/78  12/13/15 173/93  10/11/15 138/80   BP up to 158/96 after talking to me Given intermittent increases, will add low dose beta blocker

## 2015-12-17 NOTE — Progress Notes (Signed)
Pre visit review using our clinic review tool, if applicable. No additional management support is needed unless otherwise documented below in the visit note. 

## 2015-12-17 NOTE — Assessment & Plan Note (Signed)
Still smoking Tells me she plans to stop with daughter today

## 2015-12-17 NOTE — Assessment & Plan Note (Signed)
Probably mild stenosis Will check echo

## 2015-12-17 NOTE — Progress Notes (Signed)
Subjective:    Patient ID: Michelle Park, female    DOB: 05-23-1951, 65 y.o.   MRN: [id number]  HPI Here with daughter Lattie Haw to follow up on BP and carotid bruits  Had [facility] NP visit on 5/11 BP was elevated Bilateral carotid bruits noted and heart murmur Seen at urgent care--discussed with that practitioner  Ongoing sleep problems flexeril not helping Has been diagnosed with RLS--but no longer takes the iron (constipation) Stopped the meloxicam--- only takes 162mg  of aspirin at bedtime (keeps it for bad headache) Has seen Dr Cram--but doesn't want any surgery  No chest pain Some SOB at night--when she lies down. Will awaken trying to catch breath---may be related to sleep problems (but only very mild apnea) Still with some smoker's cough No dizziness now--but has this at times  Current Outpatient Prescriptions on File Prior to Visit  Medication Sig Dispense Refill  . ALPRAZolam (XANAX) 0.5 MG tablet TAKE 1 TABLET BY MOUTH TWICE A DAY AS NEEDED 60 tablet 0  . calcium-vitamin D (OSCAL WITH D) 500-200 MG-UNIT per tablet 1500 mg per day for low bone density    . cyclobenzaprine (FLEXERIL) 10 MG tablet Take 0.5-1 tablets (5-10 mg total) by mouth at bedtime as needed for muscle spasms. 30 tablet 3  . escitalopram (LEXAPRO) 20 MG tablet Take 1 tablet (20 mg total) by mouth daily. 90 tablet 3  . losartan-hydrochlorothiazide (HYZAAR) 100-25 MG tablet Take 1 tablet by mouth daily. 90 tablet 3  . meloxicam (MOBIC) 7.5 MG tablet Take 7.5 mg by mouth 2 (two) times daily.    . Multiple Vitamin (MULTIVITAMIN) capsule Take 1 capsule by mouth daily.    Marland Kitchen omeprazole (PRILOSEC) 40 MG capsule Take 1 capsule (40 mg total) by mouth daily. 90 capsule 3  . traMADol (ULTRAM) 50 MG tablet Take by mouth every 6 (six) hours as needed.     No current facility-administered medications on file prior to visit.    Allergies  Allergen Reactions  . Aspirin   . Atorvastatin     REACTION: causes leg pain  .  Rosuvastatin Other (See Comments)    Myalgias    Past Medical History  Diagnosis Date  . Chronic back pain   . Allergic rhinitis due to pollen   . GERD (gastroesophageal reflux disease)   . Hyperlipidemia   . Hypertension   . Episodic mood disorder [facility])     Past Surgical History  Procedure Laterality Date  . Foot surgery  1979 and 1988    Family History  Problem Relation Age of Onset  . Heart disease Mother   . Alzheimer's disease Mother   . Cancer Sister     Social History   Social History  . Marital Status: Divorced    Spouse Name: N/A  . Number of Children: 1  . Years of Education: N/A   Occupational History  . [employer] IT specialist     Retired   Social History Main Topics  . Smoking status: Former Smoker -- 1.00 packs/day for 45 years    Types: Cigarettes    Start date: 12/17/2015    Quit date: 12/17/2015  . Smokeless tobacco: Never Used  . Alcohol Use: 0.0 oz/week    0 Standard drinks or equivalent per week     Comment: rare glass of wine  . Drug Use: No  . Sexual Activity: Not on file   Other Topics Concern  . Not on file   Social History Narrative  Goes by [employer].   1 daughter   Review of Systems  Appetite is fine Weight down 10#--trying to eat healthier Had fall a coupe of months ago--hit left head. No LOC. No evaluation for this     Objective:   Physical Exam  Constitutional: She appears well-developed and well-nourished. No distress.  Neck: Normal range of motion. Neck supple. No thyromegaly present.  Referred murmur in carotids  Cardiovascular: Normal rate and regular rhythm.  Exam reveals no gallop.   Gr 2/6 aortic systolic murmur to carotids  Pulmonary/Chest: Effort normal and breath sounds normal. No respiratory distress. She has no wheezes.  Psychiatric:  Mildly anxious          Assessment & Plan:

## 2015-12-18 DIAGNOSIS — R0602 Shortness of breath: Secondary | ICD-10-CM | POA: Diagnosis not present

## 2015-12-19 ENCOUNTER — Telehealth: Payer: Self-pay | Admitting: *Deleted

## 2015-12-19 NOTE — Telephone Encounter (Signed)
Pt left message at Triage. Pt isn't sure if this episode she had was a side eff. of starting the metoprolol that was prescribed when she saw Dr. Silvio Pate on 12/17/15 but yesterday pt called EMS for ? of a panic attack. Pt said she was SOB and her BP was very high when EMS arrived it was 170/130, EMS did an EKG and it was okay so EMS advise her to take one of her xanaxs and it did help her sxs, she said her BP went back to normal range per EMS but she wasn't sure what the final BP was before they left. Pt didn't go to the hospital since she was stable after taking her xanax. Pt said since then she has felt very weak/ fatigued and she thinks her sxs are due to starting the new BP med and would like a new med, pt hasn't check her BP so she isn't sure what it is now

## 2015-12-19 NOTE — Telephone Encounter (Signed)
She said she forgot to the nurse this morning that her left wrist was hurting and this morning she was having right elbow pain. Thinks it may be a fibromyalgia flare-up. She took a Tramadol this morning for the pain, but it didn't help. She took the medicine again this morning. She has had a lot of things going on that are stressful. Thinks it was more stress and panic then the medication. She will continue to take the BP Med for now

## 2015-12-19 NOTE — Telephone Encounter (Signed)
I think it is not that likely that the medicine itself would cause a panic attack and elevated BP (it is used to slow the heart and sometimes can help with panic) Still, if her symptoms started right after a first dose, it is possible.  I am not excited about starting her on any other meds though---and it was not clear cut.  If she doesn't feel comfortable waiting 2 days and trying the med again, she should just monitor her BP and let me know if it is persistently elevated

## 2015-12-21 ENCOUNTER — Telehealth: Payer: Self-pay | Admitting: Internal Medicine

## 2015-12-24 NOTE — Telephone Encounter (Signed)
Left refill on voice mail at pharmacy  

## 2015-12-24 NOTE — Telephone Encounter (Signed)
Approved: #[id number] x 0 

## 2015-12-24 NOTE — Telephone Encounter (Signed)
Called pt, she had previously left a message on Triage machine.  She was upset that her xanex request had not been filled on 12/21/15.  I explained the request had come from the pharmacy after 5 pm on 12/21/15, she abruptly informed me that she had left a message on the triage phone and "this is not the first time this has happened"  Pt stated she was "not so sure about [facility] and I am going to talk to Dr. Silvio Pate about this"  I said I was sorry that our process had not worked, patient interrupted and stated "you will be sorry" and hung up the phone.

## 2015-12-24 NOTE — Telephone Encounter (Signed)
Last filled 11-23-15 #90 Last OV 12-17-15 Next OV 03-25-16

## 2015-12-24 NOTE — Telephone Encounter (Signed)
She has had a pattern of inappropriate behavior to our staff. Can you please speak to her--- I would be in favor of sending an official letter stating that she will be discharged from the practice if she cannot be polite with out staff

## 2015-12-26 NOTE — Telephone Encounter (Signed)
Just as well if she leaves. Any more inappropriate behavior and we will discharge her from practice

## 2015-12-26 NOTE — Telephone Encounter (Signed)
Called and spoke with pt for 24 minutes at PCP request.  Patient stated that her left hand is swollen and has been hurting for awhile.  She is taking antiinflammatories and would like to know if she can take (2) 7.5 tablets of Meloxicam  2 times daily.  She stated that she "is not getting very good care at your office, Dr. Silvio Pate needs to take me seriously about my health.  Dr. Silvio Pate has ignored me and ya'll are too strict about 30 days for my Xanax."  Pt stated that she may not keep her next appointment with Dr. Silvio Pate.  I told her if she chose to transfer to another office, with a signed release we could transfer her records.  At that time she shared that she had been to [facility], Dr. Rex Kras, Dr. Charlestine Night and she is not going back to any of them.  She also stated that "there is something wrong with your front office and your Triage nurse, they are not giving you the whole message".  I explained that we do write up phone notes when the patient calls and there is documentation in the chart.  She said she does not abuse her xanax. She says she feels is being targeted by not getting her medicine as quickly as she wants it.    I explained that we are following the guidelines of our office and providers by filling prescription requests within a  24 -48 hour business day window and filling controlled substances for 30 days.    I requested that she respectfully call in to our office or her pharmacy,  leaving enough time to get her prescription filled.  Patient stated she has always treated our office with the "utmost respect".  I offered an appointment with Dr. Silvio Pate tomorrow 12/27/15, 01/01/16 to check her hand, pt declined.

## 2015-12-27 NOTE — Telephone Encounter (Signed)
I advised her that she can either take 2 tabs daily or 1 twice a day. Not 2 twice a day. She said she understood.

## 2015-12-27 NOTE — Telephone Encounter (Signed)
i advised the patient that she can only take 15mg  of meloxicam daily. So, she

## 2016-01-01 ENCOUNTER — Ambulatory Visit (HOSPITAL_COMMUNITY): Payer: Medicare Other | Attending: Cardiovascular Disease

## 2016-01-07 NOTE — Telephone Encounter (Signed)
Daughter [name] called, very perplexed about her mother's paranoid behavior.  She states "this is not my mother, she is very angry, mean and she went on a rampage this morning.  She has called the sheriff to her house thinking someone is at her house, she thinks someone is taking her medications".  She is concerned and would prefer that this phone call be confidential and not relayed to her mother, she thinks this will just anger her further.  Michelle Park states that when she was taking the "memory test from the home nurse, that she got 2 out of 3, but changed it to 3 out of 3.  Please call daughter to advise.  Best number [phone].

## 2016-01-07 NOTE — Telephone Encounter (Signed)
Left message for daughter on identified voice message Discussed that she had not been paranoid in our interactions--just rude (such that dismissal from practice had been considered) Recommended that she bring her to ER for evaluation if she is truly paranoid--could have acute medical cause for this

## 2016-01-16 ENCOUNTER — Other Ambulatory Visit: Payer: Self-pay | Admitting: Internal Medicine

## 2016-01-16 NOTE — Telephone Encounter (Signed)
Pt called to ck why xanax was denied; advised refill request was too soon; pt said she was going to pick up some other med at pharmacy and was going to make one trip to pharmacy. Pt voiced understanding and will cb on 01/21/16 to make sure Dr Silvio Pate had time to fill before 12/24/15. Pt very polite and did voice understanding.

## 2016-01-21 ENCOUNTER — Other Ambulatory Visit: Payer: Self-pay

## 2016-01-21 NOTE — Telephone Encounter (Signed)
Pt left v/m requesting refill alprazolam to [facility]. Last refilled # 60 on 12/24/15. Pt last seen 12/17/15. Pt request cb when refilled.

## 2016-01-22 MED ORDER — ALPRAZOLAM 0.5 MG PO TABS: 0.5000 mg | ORAL_TABLET | Freq: Two times a day (BID) | ORAL | Status: DC | PRN

## 2016-01-22 NOTE — Telephone Encounter (Signed)
Left refill on voice mail at pharmacy. I will call the pt to set up an appointment

## 2016-01-22 NOTE — Telephone Encounter (Signed)
Approved: #[phone] x 0 Let her know that I need to see her again before refilling this again---after the issues we had around the last visit

## 2016-01-30 NOTE — Telephone Encounter (Signed)
Pt called and asked to speak to office manager, she wanted to relay to our office and PCP that she is no longer a patient here and has chosen to go to [facility].  States she has an appointment with PA Noelle Powel.  (but may be eventually seeing Dr. Allison Quarry?).  She specifically wanted me to write the phone note that "I don't need ya'll anymore, and that the whole message is never relayed like I said it".  "Ya'll don't have time for me, nobody ever told me that Dr. Silvio Pate went to a nursing home, I'm just telling it like it is."  On phone with pt for 11 minutes where she was repeating the same comments.  Changed PCP at pt request.

## 2016-01-30 NOTE — Telephone Encounter (Signed)
I think she will be hard to satisfy but glad she is giving someone else a chance to help her.

## 2016-02-18 ENCOUNTER — Other Ambulatory Visit: Payer: Self-pay | Admitting: Internal Medicine

## 2016-03-25 ENCOUNTER — Encounter: Payer: 59 | Admitting: Internal Medicine

## 2016-04-29 ENCOUNTER — Other Ambulatory Visit: Payer: Self-pay | Admitting: Physician Assistant

## 2016-04-29 DIAGNOSIS — Z1231 Encounter for screening mammogram for malignant neoplasm of breast: Secondary | ICD-10-CM

## 2016-05-01 ENCOUNTER — Other Ambulatory Visit: Payer: Self-pay | Admitting: Physician Assistant

## 2016-05-01 ENCOUNTER — Other Ambulatory Visit (HOSPITAL_COMMUNITY)
Admission: RE | Admit: 2016-05-01 | Discharge: 2016-05-01 | Disposition: A | Discharge status: Home / Self Care | Payer: Medicare Other | Source: Ambulatory Visit | Attending: Family Medicine | Admitting: Family Medicine

## 2016-05-01 DIAGNOSIS — Z124 Encounter for screening for malignant neoplasm of cervix: Secondary | ICD-10-CM | POA: Insufficient documentation

## 2016-05-01 DIAGNOSIS — M8580 Other specified disorders of bone density and structure, unspecified site: Secondary | ICD-10-CM

## 2016-05-05 LAB — CYTOLOGY - PAP

## 2016-06-16 ENCOUNTER — Ambulatory Visit
Admission: RE | Admit: 2016-06-16 | Discharge: 2016-06-16 | Disposition: A | Discharge status: Home / Self Care | Payer: Medicare Other | Source: Ambulatory Visit | Attending: Physician Assistant | Admitting: Physician Assistant

## 2016-06-16 ENCOUNTER — Ambulatory Visit: Payer: Medicare Other

## 2016-06-16 DIAGNOSIS — Z1231 Encounter for screening mammogram for malignant neoplasm of breast: Secondary | ICD-10-CM

## 2016-06-16 DIAGNOSIS — M8580 Other specified disorders of bone density and structure, unspecified site: Secondary | ICD-10-CM

## 2016-08-07 ENCOUNTER — Other Ambulatory Visit: Payer: Self-pay | Admitting: Internal Medicine

## 2016-08-27 ENCOUNTER — Ambulatory Visit
Admission: RE | Admit: 2016-08-27 | Discharge: 2016-08-27 | Disposition: A | Discharge status: Home / Self Care | Payer: Medicare Other | Source: Ambulatory Visit | Attending: Physician Assistant | Admitting: Physician Assistant

## 2016-08-27 ENCOUNTER — Other Ambulatory Visit: Payer: Self-pay | Admitting: Physician Assistant

## 2016-08-27 DIAGNOSIS — R05 Cough: Secondary | ICD-10-CM

## 2016-08-27 DIAGNOSIS — R059 Cough, unspecified: Secondary | ICD-10-CM

## 2016-08-31 ENCOUNTER — Other Ambulatory Visit: Payer: Self-pay | Admitting: Internal Medicine

## 2017-07-07 ENCOUNTER — Other Ambulatory Visit: Payer: Self-pay | Admitting: Neurosurgery

## 2017-07-07 DIAGNOSIS — M5440 Lumbago with sciatica, unspecified side: Secondary | ICD-10-CM

## 2017-07-18 ENCOUNTER — Ambulatory Visit
Admission: RE | Admit: 2017-07-18 | Discharge: 2017-07-18 | Disposition: A | Discharge status: Home / Self Care | Payer: Medicare Other | Source: Ambulatory Visit | Attending: Neurosurgery | Admitting: Neurosurgery

## 2017-07-18 DIAGNOSIS — M5440 Lumbago with sciatica, unspecified side: Secondary | ICD-10-CM

## 2017-09-16 ENCOUNTER — Other Ambulatory Visit: Payer: Self-pay | Admitting: Neurosurgery

## 2017-09-16 DIAGNOSIS — M5440 Lumbago with sciatica, unspecified side: Secondary | ICD-10-CM

## 2017-09-22 ENCOUNTER — Ambulatory Visit
Admission: RE | Admit: 2017-09-22 | Discharge: 2017-09-22 | Disposition: A | Discharge status: Home / Self Care | Payer: Medicare Other | Source: Ambulatory Visit | Attending: Neurosurgery | Admitting: Neurosurgery

## 2017-09-22 DIAGNOSIS — M5440 Lumbago with sciatica, unspecified side: Secondary | ICD-10-CM

## 2017-09-22 MED ORDER — GADOBENATE DIMEGLUMINE 529 MG/ML IV SOLN
20.0000 mL | Freq: Once | INTRAVENOUS | Status: AC | PRN
  Administered 2017-09-22: 20 mL via INTRAVENOUS

## 2018-01-20 ENCOUNTER — Other Ambulatory Visit: Payer: Self-pay | Admitting: Physician Assistant

## 2018-01-20 DIAGNOSIS — Z1231 Encounter for screening mammogram for malignant neoplasm of breast: Secondary | ICD-10-CM

## 2018-02-09 ENCOUNTER — Ambulatory Visit
Admission: RE | Admit: 2018-02-09 | Discharge: 2018-02-09 | Disposition: A | Discharge status: Home / Self Care | Payer: Medicare Other | Source: Ambulatory Visit | Attending: Physician Assistant | Admitting: Physician Assistant

## 2018-02-09 DIAGNOSIS — Z1231 Encounter for screening mammogram for malignant neoplasm of breast: Secondary | ICD-10-CM

## 2018-05-08 ENCOUNTER — Encounter (HOSPITAL_COMMUNITY): Payer: Self-pay | Admitting: Emergency Medicine

## 2018-05-08 ENCOUNTER — Ambulatory Visit (HOSPITAL_COMMUNITY)
Admission: EM | Admit: 2018-05-08 | Discharge: 2018-05-08 | Disposition: A | Discharge status: Home / Self Care | Payer: Medicare Other | Attending: Family Medicine | Admitting: Family Medicine

## 2018-05-08 DIAGNOSIS — J40 Bronchitis, not specified as acute or chronic: Secondary | ICD-10-CM

## 2018-05-08 DIAGNOSIS — M545 Low back pain, unspecified: Secondary | ICD-10-CM

## 2018-05-08 MED ORDER — PREDNISONE 20 MG PO TABS: 40.0000 mg | ORAL_TABLET | Freq: Every day | ORAL | 0 refills | Status: AC

## 2018-05-08 MED ORDER — OXYCODONE HCL 5 MG PO TABS: 5.0000 mg | ORAL_TABLET | Freq: Four times a day (QID) | ORAL | 0 refills | Status: DC | PRN

## 2018-05-08 NOTE — Discharge Instructions (Addendum)
Do not drink alcohol, do any illicit/street drugs, drive or do anything that requires alertness while on this medicine.   Ice/cold pack over area for 10-15 min twice daily.  Heat (pad or rice pillow in microwave) over affected area, 10-15 minutes twice daily.   OK to take Tylenol 1000 mg (2 extra strength tabs) or 975 mg (3 regular strength tabs) every 6 hours as needed.  Stretches are more important that exercises right now.

## 2018-05-08 NOTE — ED Provider Notes (Addendum)
  [facility]    CSN: [id number] Arrival date & time: 05/08/18  1539  Chief Complaint  Patient presents with  . Back Pain    [place] here for URI complaints.  Duration: 2 days  Associated symptoms: sinus congestion, rhinorrhea, wheezing, shortness of breath and Cough Denies: sinus pain, itchy watery eyes, ear pain, ear drainage, sore throat, myalgia and Fevers Treatment to date: none Sick contacts: No   Patient has history of chronic low back pain and a recent neurosurgical back procedure earlier this year.  She follows with neurosurgery team.  She takes oxycodone-Tylenol for her pain.  She is trying to come off of this through rehab.  It is not going well.  5 days ago, she started having worsening pain.  No injury or change in activity.  No loss of bowel/bladder function.  She has an appointment with her neurosurgeon this coming week.  Tylenol has not been helpful.  ROS:  Const: Denies fevers HEENT: As noted in HPI Lungs: No SOB  Past Medical History:  Diagnosis Date  . Allergic rhinitis due to pollen   . Chronic back pain   . Episodic mood disorder ([place])   . GERD (gastroesophageal reflux disease)   . Hyperlipidemia   . Hypertension     BP (!) 160/75 (BP Location: Left Arm)   Pulse 78   Temp 97.7 F (36.5 C) (Oral)   Resp 18   SpO2 100%  General: Awake, alert, appears stated age HEENT: AT, [place], ears patent b/l and TM's neg, nares patent w/o discharge, pharynx pink and without exudates, MMM Neck: No masses or asymmetry Heart: RRR Lungs: Diffuse expiratory wheezes, no accessory muscle use Neuro: No cerebellar signs, no clonus, DTRs are equal and symmetric bilaterally in the lower extremities MSK: Tender to palpation in the lumbar paraspinal musculature, to a lesser extent over the midline Psych: Age appropriate judgment and insight, normal mood and affect   Final Clinical Impressions(s) / UC Diagnoses   Final diagnoses:  Wheezy bronchitis    Bilateral low back pain without sciatica, unspecified chronicity   Oral steroids for wheezy bronchitis.  This may help with her back pain.  She is to follow-up with a neurosurgeon this coming week as originally scheduled.  Will give short supply of pain medicine to hold her over.  Tylenol recommended for first-line.  Continue ice, heat, stretches/exercises as tolerated.  The patient voiced understanding and agreement to the plan.   Discharge Instructions     Do not drink alcohol, do any illicit/street drugs, drive or do anything that requires alertness while on this medicine.   Ice/cold pack over area for 10-15 min twice daily.  Heat (pad or rice pillow in microwave) over affected area, 10-15 minutes twice daily.   OK to take Tylenol 1000 mg (2 extra strength tabs) or 975 mg (3 regular strength tabs) every 6 hours as needed.  Stretches are more important that exercises right now.   ED Prescriptions    Medication Sig Dispense Auth. Provider   oxyCODONE (OXY IR/ROXICODONE) 5 MG immediate release tablet Take 1 tablet (5 mg total) by mouth every 6 (six) hours as needed for severe pain. 12 tablet Shelda Pal, DO   predniSONE (DELTASONE) 20 MG tablet Take 2 tablets (40 mg total) by mouth daily with breakfast for 5 days. 10 tablet Shelda Pal, DO         Riki Sheer [place], [place] 05/08/18 1840

## 2018-05-08 NOTE — ED Triage Notes (Signed)
Pt here with back pain with radiation down left leg

## 2018-05-13 ENCOUNTER — Other Ambulatory Visit: Payer: Self-pay | Admitting: Family Medicine

## 2018-05-13 DIAGNOSIS — M8580 Other specified disorders of bone density and structure, unspecified site: Secondary | ICD-10-CM

## 2018-07-19 ENCOUNTER — Other Ambulatory Visit: Payer: Medicare Other

## 2018-07-24 ENCOUNTER — Other Ambulatory Visit: Payer: Self-pay

## 2018-07-24 ENCOUNTER — Emergency Department (HOSPITAL_COMMUNITY): Payer: Medicare Other

## 2018-07-24 ENCOUNTER — Emergency Department (HOSPITAL_COMMUNITY)
Admission: EM | Admit: 2018-07-24 | Discharge: 2018-07-25 | Disposition: A | Discharge status: Home / Self Care | Payer: Medicare Other | Attending: Emergency Medicine | Admitting: Emergency Medicine

## 2018-07-24 ENCOUNTER — Encounter (HOSPITAL_COMMUNITY): Payer: Self-pay | Admitting: Emergency Medicine

## 2018-07-24 DIAGNOSIS — I10 Essential (primary) hypertension: Secondary | ICD-10-CM | POA: Diagnosis not present

## 2018-07-24 DIAGNOSIS — R110 Nausea: Secondary | ICD-10-CM | POA: Diagnosis not present

## 2018-07-24 DIAGNOSIS — R109 Unspecified abdominal pain: Secondary | ICD-10-CM | POA: Insufficient documentation

## 2018-07-24 DIAGNOSIS — Z79899 Other long term (current) drug therapy: Secondary | ICD-10-CM | POA: Diagnosis not present

## 2018-07-24 DIAGNOSIS — F17210 Nicotine dependence, cigarettes, uncomplicated: Secondary | ICD-10-CM | POA: Diagnosis not present

## 2018-07-24 LAB — CBC WITH DIFFERENTIAL/PLATELET
Abs Immature Granulocytes: 0.04 10*3/uL (ref 0.00–0.07)
Basophils Absolute: 0.1 10*3/uL (ref 0.0–0.1)
Basophils Relative: 1 %
EOS PCT: 1 %
Eosinophils Absolute: 0.1 10*3/uL (ref 0.0–0.5)
HEMATOCRIT: 36.4 % (ref 36.0–46.0)
HEMOGLOBIN: 12.3 g/dL (ref 12.0–15.0)
Immature Granulocytes: 0 %
Lymphocytes Relative: 35 %
Lymphs Abs: 3.4 10*3/uL (ref 0.7–4.0)
MCH: 29.9 pg (ref 26.0–34.0)
MCHC: 33.8 g/dL (ref 30.0–36.0)
MCV: 88.6 fL (ref 80.0–100.0)
MONO ABS: 0.9 10*3/uL (ref 0.1–1.0)
MONOS PCT: 9 %
NEUTROS ABS: 5.2 10*3/uL (ref 1.7–7.7)
Neutrophils Relative %: 54 %
Platelets: 404 10*3/uL — ABNORMAL HIGH (ref 150–400)
RBC: 4.11 MIL/uL (ref 3.87–5.11)
RDW: 13.5 % (ref 11.5–15.5)
WBC: 9.7 10*3/uL (ref 4.0–10.5)
nRBC: 0 % (ref 0.0–0.2)

## 2018-07-24 LAB — LIPASE, BLOOD: Lipase: 31 U/L (ref 11–51)

## 2018-07-24 LAB — URINALYSIS, ROUTINE W REFLEX MICROSCOPIC
BACTERIA UA: NONE SEEN
Bilirubin Urine: NEGATIVE
GLUCOSE, UA: NEGATIVE mg/dL
Hgb urine dipstick: NEGATIVE
KETONES UR: NEGATIVE mg/dL
Nitrite: NEGATIVE
PROTEIN: NEGATIVE mg/dL
Specific Gravity, Urine: 1.011 (ref 1.005–1.030)
pH: 7 (ref 5.0–8.0)

## 2018-07-24 LAB — COMPREHENSIVE METABOLIC PANEL
ALBUMIN: 3.8 g/dL (ref 3.5–5.0)
ALT: 21 U/L (ref 0–44)
AST: 17 U/L (ref 15–41)
Alkaline Phosphatase: 35 U/L — ABNORMAL LOW (ref 38–126)
Anion gap: 10 (ref 5–15)
BUN: 15 mg/dL (ref 8–23)
CHLORIDE: 95 mmol/L — AB (ref 98–111)
CO2: 23 mmol/L (ref 22–32)
CREATININE: 0.98 mg/dL (ref 0.44–1.00)
Calcium: 9.3 mg/dL (ref 8.9–10.3)
GFR calc Af Amer: >60 mL/min (ref >60)
GFR, EST NON AFRICAN AMERICAN: 59 mL/min — AB (ref >60)
GLUCOSE: 105 mg/dL — AB (ref 70–99)
POTASSIUM: 3.2 mmol/L — AB (ref 3.5–5.1)
Sodium: 128 mmol/L — ABNORMAL LOW (ref 135–145)
Total Bilirubin: 0.6 mg/dL (ref 0.3–1.2)
Total Protein: 6.1 g/dL — ABNORMAL LOW (ref 6.5–8.1)

## 2018-07-24 MED ORDER — POTASSIUM CHLORIDE CRYS ER 20 MEQ PO TBCR
40.0000 meq | EXTENDED_RELEASE_TABLET | Freq: Once | ORAL | Status: AC
  Administered 2018-07-24: 40 meq via ORAL
  Filled 2018-07-24: qty 2

## 2018-07-24 MED ORDER — PANTOPRAZOLE SODIUM 40 MG IV SOLR
40.0000 mg | Freq: Once | INTRAVENOUS | Status: AC
  Administered 2018-07-24: 40 mg via INTRAVENOUS
  Filled 2018-07-24: qty 40

## 2018-07-24 MED ORDER — SODIUM CHLORIDE (PF) 0.9 % IJ SOLN
INTRAMUSCULAR | Status: AC
  Filled 2018-07-24: qty 50

## 2018-07-24 MED ORDER — ONDANSETRON HCL 4 MG/2ML IJ SOLN
4.0000 mg | Freq: Once | INTRAMUSCULAR | Status: AC
  Administered 2018-07-24: 4 mg via INTRAVENOUS
  Filled 2018-07-24: qty 2

## 2018-07-24 MED ORDER — IOPAMIDOL (ISOVUE-300) INJECTION 61%
INTRAVENOUS | Status: AC
  Filled 2018-07-24: qty 100

## 2018-07-24 MED ORDER — SODIUM CHLORIDE 0.9 % IV BOLUS
1000.0000 mL | Freq: Once | INTRAVENOUS | Status: AC
Start: 2018-07-24 — End: 2018-07-24
  Administered 2018-07-24: 1000 mL via INTRAVENOUS

## 2018-07-24 MED ORDER — METOCLOPRAMIDE HCL 5 MG/ML IJ SOLN
10.0000 mg | Freq: Once | INTRAMUSCULAR | Status: AC
  Administered 2018-07-24: 10 mg via INTRAVENOUS
  Filled 2018-07-24: qty 2

## 2018-07-24 MED ORDER — METOCLOPRAMIDE HCL 10 MG PO TABS: 10.0000 mg | ORAL_TABLET | Freq: Four times a day (QID) | ORAL | 0 refills | Status: DC | PRN

## 2018-07-24 MED ORDER — IOPAMIDOL (ISOVUE-300) INJECTION 61%
100.0000 mL | Freq: Once | INTRAVENOUS | Status: AC | PRN
  Administered 2018-07-24: 100 mL via INTRAVENOUS

## 2018-07-24 NOTE — ED Notes (Signed)
Patient transported to CT 

## 2018-07-24 NOTE — ED Triage Notes (Signed)
Patient presents with constant nausea x1 month, worsening this week. Patient states she has seen her doctor for it and was prescribed zofran ODT, which did not help and then was prescribed phenergan tablets that gave minimal improvement. Patient states lack of appetite. No vomiting or abdominal pain. Normal BMs.

## 2018-07-24 NOTE — ED Provider Notes (Signed)
[facility] DEPT Provider Note   CSN: [id number] Arrival date & time: 07/24/18  1931     History   Chief Complaint Chief Complaint  Patient presents with  . Nausea  . Dizziness    HPI Michelle Park is a 67 y.o. female.  Patient is a 67 year old female with a history of hypertension, GERD, hyperlipidemia and chronic back pain status post back surgery in January of this year.  She presents with nausea.  She has had a one-month history of worsening nausea.  She is had no vomiting.  She has decreased appetite and persistent nausea.  No fevers.  No urinary symptoms.  She has some intermittent dizziness but it is not persistent.  No balance problems.  No associated headache.  No fevers.  She has been seen by her PCP at [facility] and has been prescribed Zofran and Phenergan but she says is not helping.  She does take ibuprofen every 6 hours for her back pain.     Past Medical History:  Diagnosis Date  . Allergic rhinitis due to pollen   . Chronic back pain   . Episodic mood disorder ([place])   . GERD (gastroesophageal reflux disease)   . Hyperlipidemia   . Hypertension     Patient Active Problem List   Diagnosis Date Noted  . Aortic sclerosis 12/17/2015  . Fibromyalgia 10/11/2015  . Left hand pain 08/01/2015  . Preventative health care 03/21/2015  . Chronic back pain   . GERD (gastroesophageal reflux disease)   . Hyperlipidemia   . Hypertension   . Episodic mood disorder ([place])   . 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 SMOKER 05/09/2009  . COPD (chronic obstructive pulmonary disease) ([place]) 09/14/2008    Past Surgical History:  Procedure Laterality Date  . FOOT SURGERY  1979 and 1988     OB History   No obstetric history on file.      Home Medications    Prior to Admission medications   Medication Sig Start Date End Date Taking? Authorizing Provider  ALPRAZolam Duanne Moron) 0.5 MG tablet Take 1 tablet (0.5 mg total) by mouth 2 (two)  times daily as needed. 01/22/16   Venia Carbon, MD  calcium-vitamin D (OSCAL WITH D) 500-200 MG-UNIT per tablet 1500 mg per day for low bone density    [provider]  cyclobenzaprine (FLEXERIL) 10 MG tablet Take 0.5-1 tablets (5-10 mg total) by mouth at bedtime as needed for muscle spasms. 10/11/15   Venia Carbon, MD  escitalopram (LEXAPRO) 20 MG tablet Take 1 tablet (20 mg total) by mouth daily. 07/25/15   Venia Carbon, MD  fexofenadine (ALLEGRA) 180 MG tablet Take 180 mg by mouth daily.    [provider]  losartan-hydrochlorothiazide (HYZAAR) 100-25 MG tablet Take 1 tablet by mouth daily. 09/06/15   Venia Carbon, MD  meloxicam (MOBIC) 7.5 MG tablet Take 7.5 mg by mouth 2 (two) times daily.    [provider]  metoCLOPramide (REGLAN) 10 MG tablet Take 1 tablet (10 mg total) by mouth every 6 (six) hours as needed for nausea (nausea/headache). 07/24/18   Malvin Johns, MD  metoprolol succinate (TOPROL-XL) 25 MG 24 hr tablet Take 1 tablet (25 mg total) by mouth daily. 12/17/15   Venia Carbon, MD  Multiple Vitamin (MULTIVITAMIN) capsule Take 1 capsule by mouth daily.    [provider]  omeprazole (PRILOSEC) 40 MG capsule Take 1 capsule (40 mg total) by mouth daily. 09/10/15  Venia Carbon, MD  oxyCODONE (OXY IR/ROXICODONE) 5 MG immediate release tablet Take 1 tablet (5 mg total) by mouth every 6 (six) hours as needed for severe pain. 05/08/18   Shelda Pal, DO    Family History Family History  Problem Relation Age of Onset  . Heart disease Mother   . Alzheimer's disease Mother   . Cancer Sister     Social History Social History   Tobacco Use  . Smoking status: Former Smoker    Packs/day: 1.00    Years: 45.00    Pack years: 45.00    Types: Cigarettes    Start date: 12/17/2015    Last attempt to quit: 12/17/2015    Years since quitting: 2.6  . Smokeless tobacco: Never Used  Substance Use Topics  . Alcohol use: Yes      Alcohol/week: 0.0 standard drinks    Comment: rare glass of wine  . Drug use: No     Allergies   Aspirin; Atorvastatin; and Rosuvastatin   Review of Systems Review of Systems  Constitutional: Positive for appetite change and fatigue. Negative for chills, diaphoresis and fever.  HENT: Negative for congestion, rhinorrhea and sneezing.   Eyes: Negative.   Respiratory: Negative for cough, chest tightness and shortness of breath.   Cardiovascular: Negative for chest pain and leg swelling.  Gastrointestinal: Positive for nausea. Negative for abdominal pain, blood in stool, diarrhea and vomiting.  Genitourinary: Negative for difficulty urinating, flank pain, frequency and hematuria.  Musculoskeletal: Negative for arthralgias and back pain.  Skin: Negative for rash.  Neurological: Negative for dizziness, speech difficulty, weakness, numbness and headaches.     Physical Exam Updated Vital Signs BP 136/78 (BP Location: Right Arm)   Pulse 74   Temp 98.2 F (36.8 C) (Oral)   Resp 18   Ht 5\' 4"  (1.626 m)   Wt 93.4 kg   SpO2 99%   BMI 35.36 kg/m   Physical Exam Constitutional:      Appearance: She is well-developed.  HENT:     Head: Normocephalic and atraumatic.  Eyes:     Pupils: Pupils are equal, round, and reactive to light.  Neck:     Musculoskeletal: Normal range of motion and neck supple.  Cardiovascular:     Rate and Rhythm: Normal rate and regular rhythm.     Heart sounds: Normal heart sounds.  Pulmonary:     Effort: Pulmonary effort is normal. No respiratory distress.     Breath sounds: Normal breath sounds. No wheezing or rales.  Chest:     Chest wall: No tenderness.  Abdominal:     General: Bowel sounds are normal.     Palpations: Abdomen is soft.     Tenderness: There is abdominal tenderness. There is no guarding or rebound.     Comments: Mild tenderness to the epigastrium  Musculoskeletal: Normal range of motion.  Lymphadenopathy:     Cervical: No  cervical adenopathy.  Skin:    General: Skin is warm and dry.     Findings: No rash.  Neurological:     Mental Status: She is alert and oriented to person, place, and time.      ED Treatments / Results  Labs (all labs ordered are listed, but only abnormal results are displayed) Labs Reviewed  COMPREHENSIVE METABOLIC PANEL - Abnormal; Notable for the following components:      Result Value   Sodium 128 (*)    Potassium 3.2 (*)    Chloride 95 (*)  Glucose, Bld 105 (*)    Total Protein 6.1 (*)    Alkaline Phosphatase 35 (*)    GFR calc non Af Amer 59 (*)    All other components within normal limits  CBC WITH DIFFERENTIAL/PLATELET - Abnormal; Notable for the following components:   Platelets 404 (*)    All other components within normal limits  URINALYSIS, ROUTINE W REFLEX MICROSCOPIC - Abnormal; Notable for the following components:   Color, Urine COLORLESS (*)    Leukocytes, UA SMALL (*)    All other components within normal limits  LIPASE, BLOOD    EKG EKG Interpretation  Date/Time:  Saturday July 24 2018 19:43:34 EST Ventricular Rate:  84 PR Interval:    QRS Duration: 102 QT Interval:  352 QTC Calculation: 416 R Axis:   22 Text Interpretation:  Sinus rhythm Left atrial enlargement Low voltage, precordial leads Baseline wander in lead(s) II aVR V2 since last tracing no significant change Confirmed by Malvin Johns [phone]) on 07/24/2018 9:04:29 PM   Radiology Ct Abdomen Pelvis W Contrast  Result Date: 07/24/2018 CLINICAL DATA:  Acute onset of generalized abdominal pain and nausea. EXAM: CT ABDOMEN AND PELVIS WITH CONTRAST TECHNIQUE: Multidetector CT imaging of the abdomen and pelvis was performed using the standard protocol following bolus administration of intravenous contrast. CONTRAST:  166mL ISOVUE-300 IOPAMIDOL (ISOVUE-300) INJECTION 61% COMPARISON:  MRI of the lumbar spine performed 09/22/2017 FINDINGS: Lower chest: Minimal bibasilar atelectasis is noted.  Scattered coronary artery calcifications are seen. Hepatobiliary: The liver is unremarkable in appearance. The gallbladder is unremarkable in appearance. The common bile duct remains normal in caliber. Pancreas: The pancreas is within normal limits. Spleen: The spleen is unremarkable in appearance. Adrenals/Urinary Tract: A 5.3 x 2.1 cm mass is noted at the left adrenal gland. The right adrenal gland is unremarkable. The kidneys are within normal limits. There is no evidence of hydronephrosis. No renal or ureteral stones are identified. No perinephric stranding is seen. Stomach/Bowel: The stomach is unremarkable in appearance. The small bowel is within normal limits. The appendix is normal in caliber, without evidence of appendicitis. The colon is unremarkable in appearance. Vascular/Lymphatic: Scattered calcification is seen along the abdominal aorta and its branches. The abdominal aorta is otherwise grossly unremarkable. The inferior vena cava is grossly unremarkable. No retroperitoneal lymphadenopathy is seen. No pelvic sidewall lymphadenopathy is identified. Reproductive: The bladder is mildly distended and grossly unremarkable. The uterus is grossly unremarkable. No suspicious adnexal masses are seen. Other: No additional soft tissue abnormalities are seen. Musculoskeletal: No acute osseous abnormalities are identified. Multilevel disc space narrowing is noted along the lumbar spine. The visualized musculature is unremarkable in appearance. IMPRESSION: 1. No acute abnormality seen to explain the patient's symptoms. 2. 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 Would correlate with adrenal labs. Adrenal protocol MRI or CT would be helpful for further evaluation on an elective nonemergent basis, when and as deemed clinically appropriate. 3. Scattered coronary artery calcifications seen. Aortic Atherosclerosis (ICD10-I70.0). Electronically Signed   By: Garald Balding M.D.   On: 07/24/2018 22:54     Procedures Procedures (including critical care time)  Medications Ordered in ED Medications  iopamidol (ISOVUE-300) 61 % injection (has no administration in time range)  sodium chloride (PF) 0.9 % injection (has no administration in time range)  sodium chloride 0.9 % bolus 1,000 mL (0 mLs Intravenous Stopped 07/24/18 2233)  ondansetron (ZOFRAN) injection 4 mg (4 mg Intravenous Given 07/24/18 2126)  pantoprazole (PROTONIX) injection 40 mg (40 mg  Intravenous Given 07/24/18 2127)  iopamidol (ISOVUE-300) 61 % injection 100 mL (100 mLs Intravenous Contrast Given 07/24/18 2236)  potassium chloride SA (K-DUR,KLOR-CON) CR tablet 40 mEq (40 mEq Oral Given 07/24/18 2340)  metoCLOPramide (REGLAN) injection 10 mg (10 mg Intravenous Given 07/24/18 2340)     Initial Impression / Assessment and Plan / ED Course  I have reviewed the triage vital signs and the nursing notes.  Pertinent labs & imaging results that were available during my care of the patient were reviewed by me and considered in my medical decision making (see chart for details).     Patient is a 67 year old female who presents with a one-month history of nausea.  She has no abdominal tenderness but given her ongoing symptoms, I did feel a CT scan was appropriate to rule out occult gallbladder disease or other abnormalities.  This showed no acute abnormalities.  There is a lesion on her left adrenal gland which will need outpatient follow-up.  I did discuss this with the patient.  She also has some mild hyponatremia and hypokalemia.  She was given IV fluids and potassium replacement.  She told me that her PCP had told her that her sodium is a little bit low as well.  She is feeling better after treatment in the ED.  The Reglan seemed to help the most so I gave her prescription for a trial of this.  She will follow-up with her PCP.  Return precautions were given.  Final Clinical Impressions(s) / ED Diagnoses   Final diagnoses:  Nausea     ED Discharge Orders         Ordered    metoCLOPramide (REGLAN) 10 MG tablet  Every 6 hours PRN     07/24/18 2355           Malvin Johns, MD 07/24/18 2356

## 2018-07-24 NOTE — Discharge Instructions (Signed)
You have a growth on your left adrenal gland.  This needs further evaluation with an MRI that can be arranged by your primary care provider.  Your sodium level was slightly low at 129.  Your potassium was low at 3.2.  These values need to be rechecked by your primary care physician.  Return to the emergency room for any worsening symptoms.

## 2018-07-24 NOTE — ED Notes (Signed)
Pt aware that urine sample is needed. Unable to provide at this moment but will notify when able

## 2018-07-25 NOTE — ED Notes (Signed)
Pt and family verbalized discharge instructions and follow up care. Ambulatory with steady gait.

## 2018-08-02 ENCOUNTER — Other Ambulatory Visit: Payer: Self-pay | Admitting: Family Medicine

## 2018-08-02 ENCOUNTER — Other Ambulatory Visit: Payer: Self-pay

## 2018-08-02 ENCOUNTER — Emergency Department (HOSPITAL_COMMUNITY): Payer: Medicare Other

## 2018-08-02 ENCOUNTER — Encounter (HOSPITAL_COMMUNITY): Payer: Self-pay | Admitting: Emergency Medicine

## 2018-08-02 ENCOUNTER — Emergency Department (HOSPITAL_COMMUNITY)
Admission: EM | Admit: 2018-08-02 | Discharge: 2018-08-02 | Disposition: A | Discharge status: Home / Self Care | Payer: Medicare Other | Attending: Emergency Medicine | Admitting: Emergency Medicine

## 2018-08-02 DIAGNOSIS — I10 Essential (primary) hypertension: Secondary | ICD-10-CM | POA: Diagnosis not present

## 2018-08-02 DIAGNOSIS — Z79899 Other long term (current) drug therapy: Secondary | ICD-10-CM | POA: Diagnosis not present

## 2018-08-02 DIAGNOSIS — Z87891 Personal history of nicotine dependence: Secondary | ICD-10-CM | POA: Insufficient documentation

## 2018-08-02 DIAGNOSIS — R110 Nausea: Secondary | ICD-10-CM

## 2018-08-02 DIAGNOSIS — E278 Other specified disorders of adrenal gland: Secondary | ICD-10-CM

## 2018-08-02 LAB — CBC WITH DIFFERENTIAL/PLATELET
Abs Immature Granulocytes: 0.06 10*3/uL (ref 0.00–0.07)
Basophils Absolute: 0.1 10*3/uL (ref 0.0–0.1)
Basophils Relative: 1 %
Eosinophils Absolute: 0 10*3/uL (ref 0.0–0.5)
Eosinophils Relative: 0 %
HCT: 39.3 % (ref 36.0–46.0)
Hemoglobin: 13.1 g/dL (ref 12.0–15.0)
Immature Granulocytes: 1 %
Lymphocytes Relative: 17 %
Lymphs Abs: 2 10*3/uL (ref 0.7–4.0)
MCH: 28.6 pg (ref 26.0–34.0)
MCHC: 33.3 g/dL (ref 30.0–36.0)
MCV: 85.8 fL (ref 80.0–100.0)
Monocytes Absolute: 0.8 10*3/uL (ref 0.1–1.0)
Monocytes Relative: 7 %
NEUTROS ABS: 8.7 10*3/uL — AB (ref 1.7–7.7)
Neutrophils Relative %: 74 %
Platelets: 412 10*3/uL — ABNORMAL HIGH (ref 150–400)
RBC: 4.58 MIL/uL (ref 3.87–5.11)
RDW: 12.8 % (ref 11.5–15.5)
WBC: 11.7 10*3/uL — ABNORMAL HIGH (ref 4.0–10.5)
nRBC: 0 % (ref 0.0–0.2)

## 2018-08-02 LAB — COMPREHENSIVE METABOLIC PANEL
ALT: 22 U/L (ref 0–44)
AST: 20 U/L (ref 15–41)
Albumin: 4 g/dL (ref 3.5–5.0)
Alkaline Phosphatase: 44 U/L (ref 38–126)
Anion gap: 14 (ref 5–15)
BUN: 8 mg/dL (ref 8–23)
CO2: 22 mmol/L (ref 22–32)
Calcium: 9.8 mg/dL (ref 8.9–10.3)
Chloride: 90 mmol/L — ABNORMAL LOW (ref 98–111)
Creatinine, Ser: 0.75 mg/dL (ref 0.44–1.00)
GFR calc Af Amer: >60 mL/min (ref >60)
GFR calc non Af Amer: >60 mL/min (ref >60)
GLUCOSE: 111 mg/dL — AB (ref 70–99)
Potassium: 3.2 mmol/L — ABNORMAL LOW (ref 3.5–5.1)
Sodium: 126 mmol/L — ABNORMAL LOW (ref 135–145)
TOTAL PROTEIN: 6.9 g/dL (ref 6.5–8.1)
Total Bilirubin: 0.5 mg/dL (ref 0.3–1.2)

## 2018-08-02 LAB — I-STAT TROPONIN, ED: Troponin i, poc: 0 ng/mL (ref 0.00–0.08)

## 2018-08-02 LAB — URINALYSIS, ROUTINE W REFLEX MICROSCOPIC
Bilirubin Urine: NEGATIVE
Glucose, UA: NEGATIVE mg/dL
HGB URINE DIPSTICK: NEGATIVE
Ketones, ur: NEGATIVE mg/dL
Leukocytes, UA: NEGATIVE
Nitrite: NEGATIVE
Protein, ur: NEGATIVE mg/dL
Specific Gravity, Urine: 1.004 — ABNORMAL LOW (ref 1.005–1.030)
pH: 8 (ref 5.0–8.0)

## 2018-08-02 LAB — LIPASE, BLOOD: Lipase: 28 U/L (ref 11–51)

## 2018-08-02 MED ORDER — MECLIZINE HCL 25 MG PO TABS
12.5000 mg | ORAL_TABLET | Freq: Once | ORAL | Status: AC
  Administered 2018-08-02: 12.5 mg via ORAL
  Filled 2018-08-02: qty 1

## 2018-08-02 MED ORDER — SODIUM CHLORIDE 0.9 % IV BOLUS
1000.0000 mL | Freq: Once | INTRAVENOUS | Status: AC
  Administered 2018-08-02: 1000 mL via INTRAVENOUS

## 2018-08-02 MED ORDER — ONDANSETRON HCL 4 MG/2ML IJ SOLN
4.0000 mg | Freq: Once | INTRAMUSCULAR | Status: AC
  Administered 2018-08-02: 4 mg via INTRAVENOUS
  Filled 2018-08-02: qty 2

## 2018-08-02 MED ORDER — PROMETHAZINE HCL 12.5 MG PO TABS: 25.0000 mg | ORAL_TABLET | Freq: Three times a day (TID) | ORAL | 0 refills | Status: DC | PRN

## 2018-08-02 MED ORDER — POTASSIUM CHLORIDE CRYS ER 20 MEQ PO TBCR
20.0000 meq | EXTENDED_RELEASE_TABLET | Freq: Once | ORAL | Status: AC
  Administered 2018-08-02: 20 meq via ORAL
  Filled 2018-08-02: qty 1

## 2018-08-02 MED ORDER — PROMETHAZINE HCL 25 MG/ML IJ SOLN
12.5000 mg | Freq: Once | INTRAMUSCULAR | Status: AC
  Administered 2018-08-02: 12.5 mg via INTRAVENOUS
  Filled 2018-08-02: qty 1

## 2018-08-02 NOTE — ED Triage Notes (Signed)
Pt presents to ED from home. Pt complains of abdominal upset for two weeks. Pt prescribed Zofran and phenergan by PCP. Pt states the upset hadn't improved and went to [facility] on Saturday. No abnormal results were found and pt placed on Reglan. Pt here today because symptoms aren't improving. EMS gave 4mg  of zofran   BP 151/75 HR 80 RR 20 100%RA [id number]

## 2018-08-02 NOTE — Discharge Instructions (Signed)
Take Phenergan as directed for nausea.  Do not take this at the same time as Zofran.  As we discussed, please follow-up with your primary care doctor for further evaluation.  Additionally, follow-up with referred GI doctor for further evaluation if you continue to have symptoms.  Return the emergency department for any vomiting, abdominal pain, fevers, chest pain, difficulty breathing.

## 2018-08-02 NOTE — ED Notes (Signed)
Patient transported to X-ray 

## 2018-08-02 NOTE — ED Provider Notes (Signed)
[place] EMERGENCY DEPARTMENT Provider Note   CSN: [id number] Arrival date & time: 08/02/18  1300     History   Chief Complaint Chief Complaint  Patient presents with  . Nausea    HPI Michelle Park is a 67 y.o. female past medical history of GERD, hyperlipidemia, hypertension who presents for evaluation of nausea that is been ongoing for last 2 weeks.  Patient reports that she has had constant nausea over the last 2 weeks.  She was seen at [facility] on 07/24/18 for evaluation of same symptoms.  Her work-up there, including a CT of her abdomen pelvis was unremarkable.  Patient got Reglan while in the ED and had some improvement and was discharged home.  Patient was discharged home with a prescription for Reglan as well as Zofran which she had from her CP.  Patient states she has been taking both Reglan and Zofran with no improvement.  She said she is not having vomiting but states she just feels nauseous all the time.  She states she can only eat bland foods such as toast.  She also describes a "nauseous, heaviness from her abdomen up to her throat."  She states you have scheduled to follow-up with her primary care doctor today but states that she felt worse at home and so called EMS to bring her here instead.  Patient states she has not had any fever.  Patient denies any chest pain, difficulty breathing, weakness.  She denies any dysuria, hematuria.  She does have a history of GERD but not seen a GI doctor.  She reports she has been taking her medications for GERD.  The history is provided by the patient.    Past Medical History:  Diagnosis Date  . Allergic rhinitis due to pollen   . Chronic back pain   . Episodic mood disorder ([place])   . GERD (gastroesophageal reflux disease)   . Hyperlipidemia   . Hypertension     Patient Active Problem List   Diagnosis Date Noted  . Aortic sclerosis 12/17/2015  . Fibromyalgia 10/11/2015  . Left hand pain 08/01/2015  .  Preventative health care 03/21/2015  . Chronic back pain   . GERD (gastroesophageal reflux disease)   . Hyperlipidemia   . Hypertension   . Episodic mood disorder ([place])   . 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 SMOKER 05/09/2009  . COPD (chronic obstructive pulmonary disease) ([place]) 09/14/2008    Past Surgical History:  Procedure Laterality Date  . FOOT SURGERY  1979 and 1988     OB History   No obstetric history on file.      Home Medications    Prior to Admission medications   Medication Sig Start Date End Date Taking? Authorizing Provider  ALPRAZolam Duanne Moron) 0.5 MG tablet Take 1 tablet (0.5 mg total) by mouth 2 (two) times daily as needed. 01/22/16   Venia Carbon, MD  calcium-vitamin D (OSCAL WITH D) 500-200 MG-UNIT per tablet 1500 mg per day for low bone density    [provider]  cyclobenzaprine (FLEXERIL) 10 MG tablet Take 0.5-1 tablets (5-10 mg total) by mouth at bedtime as needed for muscle spasms. 10/11/15   Venia Carbon, MD  escitalopram (LEXAPRO) 20 MG tablet Take 1 tablet (20 mg total) by mouth daily. 07/25/15   Venia Carbon, MD  fexofenadine (ALLEGRA) 180 MG tablet Take 180 mg by mouth daily.    [provider]  losartan-hydrochlorothiazide (HYZAAR) 100-25 MG tablet Take 1  tablet by mouth daily. 09/06/15   Venia Carbon, MD  meloxicam (MOBIC) 7.5 MG tablet Take 7.5 mg by mouth 2 (two) times daily.    [provider]  metoCLOPramide (REGLAN) 10 MG tablet Take 1 tablet (10 mg total) by mouth every 6 (six) hours as needed for nausea (nausea/headache). 07/24/18   Malvin Johns, MD  metoprolol succinate (TOPROL-XL) 25 MG 24 hr tablet Take 1 tablet (25 mg total) by mouth daily. 12/17/15   Venia Carbon, MD  Multiple Vitamin (MULTIVITAMIN) capsule Take 1 capsule by mouth daily.    [provider]  omeprazole (PRILOSEC) 40 MG capsule Take 1 capsule (40 mg total) by mouth daily. 09/10/15   Venia Carbon, MD  oxyCODONE (OXY  IR/ROXICODONE) 5 MG immediate release tablet Take 1 tablet (5 mg total) by mouth every 6 (six) hours as needed for severe pain. 05/08/18   Shelda Pal, DO  promethazine (PHENERGAN) 12.5 MG tablet Take 2 tablets (25 mg total) by mouth every 8 (eight) hours as needed for nausea or vomiting. 08/02/18   Volanda Napoleon, PA-C    Family History Family History  Problem Relation Age of Onset  . Heart disease Mother   . Alzheimer's disease Mother   . Cancer Sister     Social History Social History   Tobacco Use  . Smoking status: Former Smoker    Packs/day: 1.00    Years: 45.00    Pack years: 45.00    Types: Cigarettes    Start date: 12/17/2015    Last attempt to quit: 12/17/2015    Years since quitting: 2.6  . Smokeless tobacco: Never Used  Substance Use Topics  . Alcohol use: Yes    Alcohol/week: 0.0 standard drinks    Comment: rare glass of wine  . Drug use: No     Allergies   Aspirin; Atorvastatin; and Rosuvastatin   Review of Systems Review of Systems  Constitutional: Positive for appetite change. Negative for fever.  Respiratory: Negative for cough and shortness of breath.   Cardiovascular: Negative for chest pain.  Gastrointestinal: Positive for nausea. Negative for abdominal pain and vomiting.  Genitourinary: Negative for dysuria and hematuria.  Neurological: Negative for headaches.  All other systems reviewed and are negative.    Physical Exam Updated Vital Signs BP 116/68 (BP Location: Right Arm)   Pulse 83   Temp 98.2 F (36.8 C) (Oral)   Resp 18   Ht 5\' 4"  (1.626 m)   Wt 93.4 kg   SpO2 93%   BMI 35.36 kg/m   Physical Exam Vitals signs and nursing note reviewed.  Constitutional:      Appearance: Normal appearance. She is well-developed.     Comments: Sitting comfortably on examination table  HENT:     Head: Normocephalic and atraumatic.  Eyes:     General: Lids are normal.     Conjunctiva/sclera: Conjunctivae normal.     Pupils:  Pupils are equal, round, and reactive to light.  Neck:     Musculoskeletal: Full passive range of motion without pain.  Cardiovascular:     Rate and Rhythm: Normal rate and regular rhythm.     Pulses: Normal pulses.     Heart sounds: Normal heart sounds. No murmur. No friction rub. No gallop.   Pulmonary:     Effort: Pulmonary effort is normal.     Breath sounds: Normal breath sounds.     Comments: Lungs clear to auscultation bilaterally.  Symmetric chest rise.  No wheezing, rales, rhonchi. Abdominal:     Palpations: Abdomen is soft. Abdomen is not rigid.     Tenderness: There is no abdominal tenderness. There is no guarding.     Comments: Abdomen is soft, non-distended, non-tender. No rigidity, No guarding. No peritoneal signs.  Musculoskeletal: Normal range of motion.  Skin:    General: Skin is warm and dry.     Capillary Refill: Capillary refill takes less than 2 seconds.  Neurological:     Mental Status: She is alert and oriented to person, place, and time.  Psychiatric:        Speech: Speech normal.      ED Treatments / Results  Labs (all labs ordered are listed, but only abnormal results are displayed) Labs Reviewed  COMPREHENSIVE METABOLIC PANEL - Abnormal; Notable for the following components:      Result Value   Sodium 126 (*)    Potassium 3.2 (*)    Chloride 90 (*)    Glucose, Bld 111 (*)    All other components within normal limits  CBC WITH DIFFERENTIAL/PLATELET - Abnormal; Notable for the following components:   WBC 11.7 (*)    Platelets 412 (*)    Neutro Abs 8.7 (*)    All other components within normal limits  URINALYSIS, ROUTINE W REFLEX MICROSCOPIC - Abnormal; Notable for the following components:   Color, Urine STRAW (*)    Specific Gravity, Urine 1.004 (*)    All other components within normal limits  LIPASE, BLOOD  I-STAT TROPONIN, ED    EKG EKG Interpretation  Date/Time:  Monday August 02 2018 13:12:08 EST Ventricular Rate:  76 PR  Interval:    QRS Duration: 110 QT Interval:  386 QTC Calculation: 434 R Axis:   38 Text Interpretation:  Sinus rhythm When compared to prior, no significant changes seen.  No STEMI Confirmed by Antony Blackbird [phone]) on 08/02/2018 1:20:07 PM Also confirmed by Antony Blackbird [phone]), editor Philomena Doheny [phone])  on 08/02/2018 3:58:20 PM   Radiology Dg Chest 2 View  Result Date: 08/02/2018 CLINICAL DATA:  Nausea. EXAM: CHEST - 2 VIEW COMPARISON:  Chest x-ray dated August 27, 2016. FINDINGS: The heart size and mediastinal contours are within normal limits. Atherosclerotic calcification of the aortic arch. Normal pulmonary vascularity. No focal consolidation, pleural effusion, or pneumothorax. No acute osseous abnormality. IMPRESSION: No active cardiopulmonary disease. Electronically Signed   By: Titus Dubin M.D.   On: 08/02/2018 15:26   US Abdomen Limited Ruq  Result Date: 08/02/2018 CLINICAL DATA:  Nausea for 2 weeks. EXAM: ULTRASOUND ABDOMEN LIMITED RIGHT UPPER QUADRANT COMPARISON:  CT abdomen and pelvis 07/24/2018 FINDINGS: Gallbladder: No gallstones or wall thickening visualized. No sonographic Murphy sign noted by sonographer. Common bile duct: Diameter: 0.4 cm Liver: No focal lesion identified. Within normal limits in parenchymal echogenicity. Portal vein is patent on color Doppler imaging with normal direction of blood flow towards the liver. IMPRESSION: Negative for gallstones.  Negative exam. Electronically Signed   By: Inge Rise M.D.   On: 08/02/2018 16:06    Procedures Procedures (including critical care time)  Medications Ordered in ED Medications  ondansetron (ZOFRAN) injection 4 mg (4 mg Intravenous Given 08/02/18 1508)  sodium chloride 0.9 % bolus 1,000 mL (0 mLs Intravenous Stopped 08/02/18 1859)  potassium chloride SA (K-DUR,KLOR-CON) CR tablet 20 mEq (20 mEq Oral Given 08/02/18 1628)  promethazine (PHENERGAN) injection 12.5 mg (12.5 mg Intravenous Given  08/02/18 1628)  meclizine (ANTIVERT) tablet 12.5 mg (12.5 mg  Oral Given 08/02/18 1751)     Initial Impression / Assessment and Plan / ED Course  I have reviewed the triage vital signs and the nursing notes.  Pertinent labs & imaging results that were available during my care of the patient were reviewed by me and considered in my medical decision making (see chart for details).     67 year old female with past medical history of hyperlipidemia, hypertension who presents for evaluation of 2 weeks of nausea.  Seen at [facility] last week for similar symptoms.  Work-up at that time was unremarkable.  Comes back because she is continuing of symptoms.  She has been taking Reglan and Zofran at home with no improvement.  She denies any abdominal pain or chest pain but she describes this " discomfort from her stomach to her throat." Patient is afebrile, non-toxic appearing, sitting comfortably on examination table. Vital signs reviewed and stable.  Abdomen exam is benign.  We will plan to check basic labs.  Additionally, she does not have any chest pain but will plan for EKG and troponin.   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  Work-up at that time was unremarkable.  Trop negative.  CBC shows slight leukocytosis of 11.7.  Platelets are 412.  UA negative for any infectious etiology.  CMP shows sodium 126, potassium 3.2.  Bicarb unremarkable.  BUN and creatinine within normal limits.  Lipase unremarkable.  Discussed results with patient.  Patient reports no improvement after Zofran.  Will try additional antiemetics here in the ED and reassess.  Potassium repleted.  Patient given bolus of fluid.  Review of records show that she had previously had slight hyponatremia at her recent visit at [facility] long.  At that time, is 128.  I did discuss her hyponatremia with patient and stated that this would be something that  her primary care doctor would need to follow-up with.  Discussed patient with Dr. Sherry Ruffing for independent evaluation of patient.  We will plan to p.o. challenge patient.  If patient able to tolerate p.o., patient be discharged home with primary care follow-up after fluid bolus.  Patient reported some improvement after Phenergan here in the ED.  Patient wants to try eating.  We will plan to p.o. challenge.  Patient able tolerate p.o. without any difficulty.  She reports feeling better here in the ED. At this time, patient exhibits no emergent life-threatening condition that require further evaluation in ED or admission.  Will give outpatient GI referral for further evaluation of her symptoms.  Strict patient follow-up with primary care doctor. Patient had ample opportunity for questions and discussion. All patient's questions were answered with full understanding. Strict return precautions discussed. Patient expresses understanding and agreement to plan.    Portions of this note were generated with [employer]. Dictation errors may occur despite best attempts at proofreading.  Final Clinical Impressions(s) / ED Diagnoses   Final diagnoses:  Nausea    ED Discharge Orders         Ordered    promethazine (PHENERGAN) 12.5 MG tablet  Every 8 hours PRN     08/02/18 1829           Volanda Napoleon, PA-C 08/03/18 1927    Tegeler, Gwenyth Allegra, MD 08/06/18 [phone]

## 2018-08-02 NOTE — ED Notes (Signed)
Patient verbalizes understanding of discharge instructions. Opportunity for questioning and answers were provided. Armband removed by staff, pt discharged from ED via wheelchair to home.  

## 2018-08-02 NOTE — ED Notes (Signed)
Has continuous nausea- but states is able to take po potassium.

## 2018-08-03 ENCOUNTER — Encounter: Payer: Self-pay | Admitting: Gastroenterology

## 2018-08-10 ENCOUNTER — Ambulatory Visit: Payer: Medicare Other | Admitting: Gastroenterology

## 2018-08-14 ENCOUNTER — Other Ambulatory Visit: Payer: Medicare Other

## 2018-08-19 ENCOUNTER — Other Ambulatory Visit (INDEPENDENT_AMBULATORY_CARE_PROVIDER_SITE_OTHER): Payer: Medicare Other

## 2018-08-19 ENCOUNTER — Encounter: Payer: Self-pay | Admitting: Gastroenterology

## 2018-08-19 ENCOUNTER — Ambulatory Visit: Payer: Medicare Other | Admitting: Gastroenterology

## 2018-08-19 VITALS — BP 132/82 | HR 74 | Ht 64.5 in | Wt 204.5 lb

## 2018-08-19 DIAGNOSIS — R935 Abnormal findings on diagnostic imaging of other abdominal regions, including retroperitoneum: Secondary | ICD-10-CM

## 2018-08-19 DIAGNOSIS — R110 Nausea: Secondary | ICD-10-CM

## 2018-08-19 DIAGNOSIS — R634 Abnormal weight loss: Secondary | ICD-10-CM

## 2018-08-19 LAB — HEPATIC FUNCTION PANEL
ALT: 13 U/L (ref 0–35)
AST: 10 U/L (ref 0–37)
Albumin: 4.2 g/dL (ref 3.5–5.2)
Alkaline Phosphatase: 51 U/L (ref 39–117)
Bilirubin, Direct: 0.1 mg/dL (ref 0.0–0.3)
Total Bilirubin: 0.3 mg/dL (ref 0.2–1.2)
Total Protein: 7 g/dL (ref 6.0–8.3)

## 2018-08-19 LAB — BASIC METABOLIC PANEL
BUN: 7 mg/dL (ref 6–23)
CO2: 26 mEq/L (ref 19–32)
Calcium: 10.3 mg/dL (ref 8.4–10.5)
Chloride: 90 mEq/L — ABNORMAL LOW (ref 96–112)
Creatinine, Ser: 0.84 mg/dL (ref 0.40–1.20)
GFR: 67.41 mL/min (ref >60.00)
Glucose, Bld: 114 mg/dL — ABNORMAL HIGH (ref 70–99)
Potassium: 3.6 mEq/L (ref 3.5–5.1)
Sodium: 124 mEq/L — ABNORMAL LOW (ref 135–145)

## 2018-08-19 LAB — PHOSPHORUS: PHOSPHORUS: 3.3 mg/dL (ref 2.3–4.6)

## 2018-08-19 LAB — MAGNESIUM: Magnesium: 1.4 mg/dL — ABNORMAL LOW (ref 1.5–2.5)

## 2018-08-19 LAB — IGA: IgA: 173 mg/dL (ref 68–378)

## 2018-08-19 LAB — TSH: TSH: 1.39 u[IU]/mL (ref 0.35–4.50)

## 2018-08-19 MED ORDER — PROCHLORPERAZINE MALEATE 10 MG PO TABS: 10.0000 mg | ORAL_TABLET | Freq: Three times a day (TID) | ORAL | 2 refills | Status: DC | PRN

## 2018-08-19 MED ORDER — PROCHLORPERAZINE 25 MG RE SUPP: 25.0000 mg | Freq: Every day | RECTAL | 1 refills | Status: DC

## 2018-08-19 MED ORDER — OMEPRAZOLE 40 MG PO CPDR: 40.0000 mg | DELAYED_RELEASE_CAPSULE | Freq: Two times a day (BID) | ORAL | 3 refills | Status: DC

## 2018-08-19 NOTE — Progress Notes (Signed)
[place] VISIT   Primary Care Provider Vandiver, L.Marlou Sa, [place] [employer] [place] [address] [phone]  Referring Provider Alroy Dust, L.Marlou Sa, [place] [employer] [place], [place] [address] [phone]  Patient Profile: Michelle Park is a 68 y.o. female with a pmh significant for GERD, hypertension, hyperlipidemia, chronic back pain.  The patient presents to the [facility] for an evaluation and management of problem(s) noted below:  Problem List 1. Abnormal CT of the abdomen   2. Nausea   3. Unintentional weight loss     History of Present Illness: This is the patient's first visit to the outpatient [facility] clinic.  She states that one month ago, she had the acute onset of nausea develop.  Interestingly., she has not had any vomiting.  However, the patient describes the symptom as being so significnat that she has not been able to tolerate significant oral intake over this time period.  She has not had any evidence of true abdominal pain.  However, when she has tried to eat foods, she just gets a general quessiness to her stomach.  She describes a 10-13 pound weight loss over this time period.  She she eats, she gets a feeling of fullness more quickly than normal.  She underwent imaging of her abdomen when she presented to the ED, and she was found to have no significant abnormalities of the GI tract.  However, she was found to have an adrenal lesion.  This is currently in the process of being worked up.  She was supposed to have a MRI-Abdomen this past Saturday however, she could not keep this due to how she was feeling.  She has been trialed on Zofran then Promethazine then Metoclopramide.  None have had great effectiveness overall.  She has taken doses every 6-8 hours to try an dhelp.  NO suppositories have helped.  She was on Prevacid and has been transitioned to Prilosec.  She denies overt pyrosis but if she  misses a dose she will have symptoms.  Her bowel mvomeent pattern has not changed; she has 1-2 formed bowel movements daily.  She is not taking NSAIDs or BC/Goody Powders.  She has never had an EGD.  She has had a previous colonoscopy.    GI Review of Systems Positive as above Negative for dysphagia, odynophagia, melena, hematochezia  Review of Systems General: Denies fevers/chills HEENT: Denies oral lesions Cardiovascular: Denies chest pain/palpitations Pulmonary: Denies shortness of breath/cough Gastroenterological: See HPI Genitourinary: Denies darkened urine or hematuria Hematological: Denies easy bruising/bleeding Endocrine: Denies temperature intolerance Dermatological: Denies jaundice Psychological: Mood is anxious and scared   Medications Current Outpatient Medications  Medication Sig Dispense Refill  . ALPRAZolam (XANAX) 0.5 MG tablet Take 1 tablet (0.5 mg total) by mouth 2 (two) times daily as needed. 60 tablet 0  . calcium-vitamin D (OSCAL WITH D) 500-200 MG-UNIT per tablet 1500 mg per day for low bone density    . diclofenac sodium (VOLTAREN) 1 % GEL Apply topically 4 (four) times daily.    Marland Kitchen escitalopram (LEXAPRO) 10 MG tablet Take 10 mg by mouth daily.    . fenofibrate 160 MG tablet Take 160 mg by mouth daily.    Marland Kitchen losartan-hydrochlorothiazide (HYZAAR) 100-25 MG tablet Take 1 tablet by mouth daily. 90 tablet 3  . Multiple Vitamin (MULTIVITAMIN) capsule Take 1 capsule by mouth daily.    Marland Kitchen omeprazole (PRILOSEC) 40 MG capsule Take 1 capsule (40 mg total) by mouth  daily. 90 capsule 3  . oxyCODONE (OXY IR/ROXICODONE) 5 MG immediate release tablet Take 1 tablet (5 mg total) by mouth every 6 (six) hours as needed for severe pain. 12 tablet 0  . magnesium gluconate (MAGONATE) 30 MG tablet Take 1 tablet (30 mg total) by mouth 2 (two) times daily for 14 days. 28 tablet 0  . Magnesium Oxide 400 MG CAPS Take 1 capsule (400 mg total) by mouth 2 (two) times daily for 14 days. 48  capsule 0  . omeprazole (PRILOSEC) 40 MG capsule Take 1 capsule (40 mg total) by mouth 2 (two) times daily. 60 capsule 3  . prochlorperazine (COMPAZINE) 10 MG tablet Take 1 tablet (10 mg total) by mouth every 8 (eight) hours as needed for nausea or vomiting. 60 tablet 2  . prochlorperazine (COMPAZINE) 25 MG suppository Place 1 suppository (25 mg total) rectally at bedtime. 30 suppository 1   No current facility-administered medications for this visit.     Allergies Allergies  Allergen Reactions  . Aspirin   . Atorvastatin     REACTION: causes leg pain  . Rosuvastatin Other (See Comments)    Myalgias    Histories Past Medical History:  Diagnosis Date  . Allergic rhinitis due to pollen   . Chronic back pain   . Episodic mood disorder ([place])   . GERD (gastroesophageal reflux disease)   . Hyperlipidemia   . Hypertension    Past Surgical History:  Procedure Laterality Date  . FOOT SURGERY  1979 and 1988   Social History   Socioeconomic History  . Marital status: Divorced    Spouse name: Not on file  . Number of children: 1  . Years of education: Not on file  . Highest education level: Not on file  Occupational History  . 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 Financial resource strain: Not on file  . Food insecurity:    Worry: Not on file    Inability: Not on file  . Transportation needs:    Medical: Not on file    Non-medical: Not on file  Tobacco Use  . Smoking status: Former Smoker    Packs/day: 1.00    Years: 45.00    Pack years: 45.00    Types: Cigarettes    Start date: 12/17/2015    Last attempt to quit: 12/17/2015    Years since quitting: 2.6  . Smokeless tobacco: Never Used  Substance and Sexual Activity  . Alcohol use: Yes    Alcohol/week: 0.0 standard drinks    Comment: rare glass of wine  . Drug use: No  . Sexual activity: Not on file  Lifestyle  . Physical activity:    Days per week: Not on file    Minutes per  session: Not on file  . Stress: Not on file  Relationships  . Social connections:    Talks on phone: Not on file    Gets together: Not on file    Attends religious service: Not on file    Active member of club or organization: Not on file    Attends meetings of clubs or organizations: Not on file    Relationship status: Not on file  . Intimate partner violence:    Fear of current or ex partner: Not on file    Emotionally abused: Not on file    Physically abused: Not on file    Forced sexual activity: Not on file  Other Topics Concern  .  Not on file  Social History Narrative   Goes by [employer].   1 daughter   Family History  Problem Relation Age of Onset  . Heart disease Mother   . Alzheimer's disease Mother   . Cancer Sister   . Colon cancer Neg Hx   . Esophageal cancer Neg Hx   . Inflammatory bowel disease Neg Hx   . Liver disease Neg Hx   . Pancreatic cancer Neg Hx   . Rectal cancer Neg Hx   . Stomach cancer Neg Hx    I have reviewed her medical, social, and family history in detail and updated the electronic medical record as necessary.    PHYSICAL EXAMINATION  BP 132/82   Pulse 74   Ht 5' 4.5" (1.638 m)   Wt 204 lb 8 oz (92.8 kg)   BMI 34.56 kg/m  Wt Readings from Last 3 Encounters:  08/19/18 204 lb 8 oz (92.8 kg)  08/02/18 206 lb (93.4 kg)  07/24/18 206 lb (93.4 kg)  GEN: NAD, appears stated age, doesn't appear chronically ill, accompanied by friend PSYCH: Cooperative, without pressured speech EYE: Conjunctivae pale-pink, sclerae anicteric ENT: MMM, without oral ulcers, no erythema or exudates noted NECK: Supple CV: RR without R/Gs  RESP: CTAB posteriorly, without wheezing GI: NABS, soft, NT/ND, without rebound or guarding, no HSM appreciated MSK/EXT: No lower extremity edema SKIN: No jaundice NEURO:  Alert & Oriented x 3, no focal deficits   REVIEW OF DATA  I reviewed the following data at the time of this encounter:  GI Procedures and Studies    2011 Colonoscopy - [facility] 2 sessile polyps in the [place] removed with cold forceps.  Few small diverticula were found in the [place].  Otherwise normal examination.  Plan was for a 5-year follow up colooscopy.  Laboratory Studies  Reviewed in epic  Imaging Studies  August 02, 2018 right upper quadrant ultrasound IMPRESSION: Negative for gallstones.  Negative exam.  July 24, 2018 CT abdomen pelvis with contrast IMPRESSION: 1. No acute abnormality seen to explain the patient's symptoms. 2. 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 Would correlate with adrenal labs. Adrenal protocol MRI or CT would be helpful for further evaluation on an elective nonemergent basis, when and as deemed clinically appropriate. 3. Scattered coronary artery calcifications seen. Aortic Atherosclerosis (ICD10-I70.0).   ASSESSMENT  Ms. Trauger is a 68 y.o. female with a pmh significant for GERD, hypertension, hyperlipidemia, chronic back pain.  The patient is seen today for evaluation and management of:  1. Abnormal CT of the abdomen   2. Nausea   3. Unintentional weight loss    This is a patient who is hemodynamically stable however has multiple medical issues as noted above.  I am concerned as to the etiology of her acute nausea that has persisted over the course the last few weeks.  Her electrolyte abnormalities are concerning to me and interesting that even though she not having overt vomiting that she is not able to tolerate significant amounts of oral intake.  She is already been on 3 different types of antiemetics without great effectiveness.  I am worried without knowing what her electrolytes are about continuing to trial her on antiemetics.  There is a possibility that if her electrolytes are significantly off that she may require an inpatient electrolyte supplementation as well as IV hydration and then subsequently further additional work-up.  I would like to try and increase her acid suppression medication  to twice daily high-dose  over the course the next few weeks.  I suspect we will be proceeding with an upper endoscopy sooner rather than later.  We will adjust her antiemetic regimen and try Compazine as the only other antinausea medication but she has not trialed as of yet.  She will stop all other antinausea medications.  We will evaluate her electrolytes and determine further management or supplementation as necessary.  With sodium derangements, potassium derangements, her nausea I worry about adrenal insufficiency or adrenal axis issues and thus with the lesion that was noted on the recent outside imaging I think it is most reasonable that further work-up and management of that become a key/priority.  We will obviously treat her for possible ulcer disease and other etiologies while we await further work-up of the adrenal lesion.  The risks and benefits of endoscopic evaluation were discussed with the patient; these include but are not limited to the risk of perforation, infection, bleeding, missed lesions, lack of diagnosis, severe illness requiring hospitalization, as well as anesthesia and sedation related illnesses.  The patient is agreeable to proceed.  All patient questions were answered, to the best of my ability, and the patient agrees to the aforementioned plan of action with follow-up as indicated.   PLAN  Laboratories as outlined below Diagnostic upper endoscopy in next 2-weeks Patient should be proactive and try and have her abdominal MRI completed so that further evaluation of her adrenal lesion can be performed Omeprazole 40 mg twice daily to be initiated continued Compazine 25 mg nightly PR Compazine 10 mg every 8 hour as needed nausea Supplementation of any electrolytes as able based on the results   Orders Placed This Encounter  Procedures  . Basic metabolic panel  . Hepatic function panel  . TSH  . Magnesium  . Phosphorus  . Tissue transglutaminase, IgA  . IgA  . Ambulatory  referral to Gastroenterology    New Prescriptions   MAGNESIUM GLUCONATE (MAGONATE) 30 MG TABLET    Take 1 tablet (30 mg total) by mouth 2 (two) times daily for 14 days.   MAGNESIUM OXIDE 400 MG CAPS    Take 1 capsule (400 mg total) by mouth 2 (two) times daily for 14 days.   OMEPRAZOLE (PRILOSEC) 40 MG CAPSULE    Take 1 capsule (40 mg total) by mouth 2 (two) times daily.   PROCHLORPERAZINE (COMPAZINE) 10 MG TABLET    Take 1 tablet (10 mg total) by mouth every 8 (eight) hours as needed for nausea or vomiting.   PROCHLORPERAZINE (COMPAZINE) 25 MG SUPPOSITORY    Place 1 suppository (25 mg total) rectally at bedtime.   Modified Medications   No medications on file    Planned Follow Up: No follow-ups on file.   Justice Britain, MD [facility] Office # [phone]

## 2018-08-19 NOTE — Patient Instructions (Signed)
You have been scheduled for an endoscopy. Please follow written instructions given to you at your visit today. If you use inhalers (even only as needed), please bring them with you on the day of your procedure. Your physician has requested that you go to www.startemmi.com and enter the access code given to you at your visit today. This web site gives a general overview about your procedure. However, you should still follow specific instructions given to you by our office regarding your preparation for the procedure.  Your provider has requested that you go to the basement level for lab work before leaving today. Press "B" on the elevator. The lab is located at the first door on the left as you exit the elevator.  We have sent the following medications to your pharmacy for you to pick up at your convenience:  Stop all other Nausea meds  Thank you for entrusting me with your care and choosing [facility] care.  Dr Rush Landmark

## 2018-08-20 ENCOUNTER — Other Ambulatory Visit: Payer: Self-pay

## 2018-08-20 ENCOUNTER — Other Ambulatory Visit: Payer: Self-pay | Admitting: Gastroenterology

## 2018-08-20 ENCOUNTER — Telehealth: Payer: Self-pay | Admitting: Gastroenterology

## 2018-08-20 DIAGNOSIS — R110 Nausea: Secondary | ICD-10-CM

## 2018-08-20 DIAGNOSIS — R634 Abnormal weight loss: Secondary | ICD-10-CM

## 2018-08-20 LAB — TISSUE TRANSGLUTAMINASE, IGA: (tTG) Ab, IgA: 6 U/mL — ABNORMAL HIGH

## 2018-08-20 MED ORDER — MAGNESIUM GLUCONATE 30 MG PO TABS: 30.0000 mg | ORAL_TABLET | Freq: Two times a day (BID) | ORAL | 0 refills | Status: DC

## 2018-08-20 MED ORDER — MAGNESIUM OXIDE 400 MG PO CAPS: 400.0000 mg | ORAL_CAPSULE | Freq: Two times a day (BID) | ORAL | 0 refills | Status: AC

## 2018-08-20 NOTE — Telephone Encounter (Signed)
Dr Rush Landmark, I spoke to the pharmacist at [facility] who stated that Magnesium Gluconate 30 mg is obsolete. They carry Magnesium Oxide 400mg . I sent in Magnesium Oxide 400 mg twice daily for 2 weeks. Patient has been notified.

## 2018-08-20 NOTE — Telephone Encounter (Signed)
Thank you for update. As we spoke, I agree with proceeding with the Magnesium Oxide. She may get some diarrhea from it, but hopefully not too much. Thanks. GM

## 2018-08-20 NOTE — Telephone Encounter (Signed)
Pt called to inform that [facility] told her that they have never heard of magnesium gluconate. Pt is asking if something similar can be prescribed.

## 2018-08-21 ENCOUNTER — Encounter: Payer: Self-pay | Admitting: Gastroenterology

## 2018-08-22 DIAGNOSIS — R110 Nausea: Secondary | ICD-10-CM | POA: Insufficient documentation

## 2018-08-22 DIAGNOSIS — R935 Abnormal findings on diagnostic imaging of other abdominal regions, including retroperitoneum: Secondary | ICD-10-CM | POA: Insufficient documentation

## 2018-08-22 DIAGNOSIS — R634 Abnormal weight loss: Secondary | ICD-10-CM | POA: Insufficient documentation

## 2018-08-26 ENCOUNTER — Encounter: Payer: Self-pay | Admitting: Gastroenterology

## 2018-08-27 ENCOUNTER — Other Ambulatory Visit: Payer: Self-pay

## 2018-08-27 ENCOUNTER — Telehealth: Payer: Self-pay | Admitting: Gastroenterology

## 2018-08-27 ENCOUNTER — Other Ambulatory Visit (INDEPENDENT_AMBULATORY_CARE_PROVIDER_SITE_OTHER): Payer: Medicare Other

## 2018-08-27 DIAGNOSIS — R110 Nausea: Secondary | ICD-10-CM

## 2018-08-27 DIAGNOSIS — R634 Abnormal weight loss: Secondary | ICD-10-CM

## 2018-08-27 DIAGNOSIS — R935 Abnormal findings on diagnostic imaging of other abdominal regions, including retroperitoneum: Secondary | ICD-10-CM

## 2018-08-27 LAB — BASIC METABOLIC PANEL
BUN: 10 mg/dL (ref 6–23)
CO2: 27 meq/L (ref 19–32)
Calcium: 9.6 mg/dL (ref 8.4–10.5)
Chloride: 95 mEq/L — ABNORMAL LOW (ref 96–112)
Creatinine, Ser: 0.92 mg/dL (ref 0.40–1.20)
GFR: 60.69 mL/min (ref >60.00)
GLUCOSE: 104 mg/dL — AB (ref 70–99)
Potassium: 3.5 mEq/L (ref 3.5–5.1)
Sodium: 130 mEq/L — ABNORMAL LOW (ref 135–145)

## 2018-08-27 LAB — MAGNESIUM: Magnesium: 1.5 mg/dL (ref 1.5–2.5)

## 2018-08-27 LAB — PHOSPHORUS: Phosphorus: 3.1 mg/dL (ref 2.3–4.6)

## 2018-08-27 MED ORDER — MAGNESIUM GLUCONATE 30 MG PO TABS: 30.0000 mg | ORAL_TABLET | Freq: Two times a day (BID) | ORAL | 0 refills | Status: DC

## 2018-08-27 NOTE — Telephone Encounter (Signed)
Left message on machine to call back  

## 2018-08-27 NOTE — Telephone Encounter (Signed)
I was able to reach the patient as her labs had come back and I wanted to talk with her this afternoon. She is feeling better as per result note and her electrolytes and how she is feeling. She is scheduled for her adrenal MRI later this weekend. Regards to the biopsy question it was only in regards to how that would be performed and I discussed that during her upcoming endoscopy in a couple of weeks we would plan to obtain biopsies of the esophagus and stomach as well as duodenum even if everything looked normal. The patient was appreciative for the call back.

## 2018-08-29 ENCOUNTER — Ambulatory Visit
Admission: RE | Admit: 2018-08-29 | Discharge: 2018-08-29 | Disposition: A | Discharge status: Home / Self Care | Payer: Medicare Other | Source: Ambulatory Visit | Attending: Family Medicine | Admitting: Family Medicine

## 2018-08-29 DIAGNOSIS — E278 Other specified disorders of adrenal gland: Secondary | ICD-10-CM

## 2018-08-29 MED ORDER — GADOBENATE DIMEGLUMINE 529 MG/ML IV SOLN
20.0000 mL | Freq: Once | INTRAVENOUS | Status: AC | PRN
  Administered 2018-08-29: 20 mL via INTRAVENOUS

## 2018-09-03 ENCOUNTER — Other Ambulatory Visit: Payer: Self-pay

## 2018-09-03 ENCOUNTER — Other Ambulatory Visit (INDEPENDENT_AMBULATORY_CARE_PROVIDER_SITE_OTHER): Payer: Medicare Other

## 2018-09-03 DIAGNOSIS — R935 Abnormal findings on diagnostic imaging of other abdominal regions, including retroperitoneum: Secondary | ICD-10-CM

## 2018-09-03 DIAGNOSIS — R790 Abnormal level of blood mineral: Secondary | ICD-10-CM

## 2018-09-03 LAB — BASIC METABOLIC PANEL
BUN: 13 mg/dL (ref 6–23)
CO2: 28 mEq/L (ref 19–32)
CREATININE: 0.91 mg/dL (ref 0.40–1.20)
Calcium: 10.1 mg/dL (ref 8.4–10.5)
Chloride: 96 mEq/L (ref 96–112)
GFR: 61.45 mL/min (ref >60.00)
Glucose, Bld: 107 mg/dL — ABNORMAL HIGH (ref 70–99)
Potassium: 3.7 mEq/L (ref 3.5–5.1)
Sodium: 131 mEq/L — ABNORMAL LOW (ref 135–145)

## 2018-09-03 LAB — PHOSPHORUS: PHOSPHORUS: 3.1 mg/dL (ref 2.3–4.6)

## 2018-09-03 LAB — MAGNESIUM: Magnesium: 1.5 mg/dL (ref 1.5–2.5)

## 2018-09-03 MED ORDER — MAGNESIUM GLUCONATE 30 MG PO TABS: 30.0000 mg | ORAL_TABLET | Freq: Two times a day (BID) | ORAL | 0 refills | Status: AC

## 2018-09-08 NOTE — Telephone Encounter (Signed)
Tried to call the patient back regarding questions? Left message for patient to call back.

## 2018-09-09 ENCOUNTER — Ambulatory Visit (AMBULATORY_SURGERY_CENTER): Payer: Medicare Other | Admitting: Gastroenterology

## 2018-09-09 ENCOUNTER — Encounter: Payer: Self-pay | Admitting: Gastroenterology

## 2018-09-09 VITALS — BP 116/83 | HR 77 | Temp 97.8°F | Resp 13

## 2018-09-09 DIAGNOSIS — K3189 Other diseases of stomach and duodenum: Secondary | ICD-10-CM

## 2018-09-09 DIAGNOSIS — K2270 Barrett's esophagus without dysplasia: Secondary | ICD-10-CM

## 2018-09-09 DIAGNOSIS — R935 Abnormal findings on diagnostic imaging of other abdominal regions, including retroperitoneum: Secondary | ICD-10-CM

## 2018-09-09 DIAGNOSIS — K2970 Gastritis, unspecified, without bleeding: Secondary | ICD-10-CM

## 2018-09-09 MED ORDER — SODIUM CHLORIDE 0.9 % IV SOLN: 500.0000 mL | Freq: Once | INTRAVENOUS | Status: DC

## 2018-09-09 NOTE — Progress Notes (Signed)
Called to room to assist during endoscopic procedure.  Patient ID and intended procedure confirmed with present staff. Received instructions for my participation in the procedure from the performing physician.  

## 2018-09-09 NOTE — Patient Instructions (Signed)
YOU HAD AN ENDOSCOPIC PROCEDURE TODAY AT THE [facility]:   Refer to the procedure report that was given to you for any specific questions about what was found during the examination.  If the procedure report does not answer your questions, please call your gastroenterologist to clarify.  If you requested that your care partner not be given the details of your procedure findings, then the procedure report has been included in a sealed envelope for you to review at your convenience later.  YOU SHOULD EXPECT: Some feelings of bloating in the abdomen. Passage of more gas than usual.  Walking can help get rid of the air that was put into your GI tract during the procedure and reduce the bloating. If you had a lower endoscopy (such as a colonoscopy or flexible sigmoidoscopy) you may notice spotting of blood in your stool or on the toilet paper. If you underwent a bowel prep for your procedure, you may not have a normal bowel movement for a few days.  Please Note:  You might notice some irritation and congestion in your nose or some drainage.  This is from the oxygen used during your procedure.  There is no need for concern and it should clear up in a day or so.  SYMPTOMS TO REPORT IMMEDIATELY:   Following upper endoscopy (EGD)  Vomiting of blood or coffee ground material  New chest pain or pain under the shoulder blades  Painful or persistently difficult swallowing  New shortness of breath  Fever of 100F or higher  Black, tarry-looking stools  For urgent or emergent issues, a gastroenterologist can be reached at any hour by calling [phone].   DIET:  We do recommend a small meal at first, but then you may proceed to your regular diet.  Drink plenty of fluids but you should avoid alcoholic beverages for 24 hours.  MEDICATIONS: Continue present medications.   Please see handouts given to you by your recovery nurse.  ACTIVITY:  You should plan to take it easy for the rest of  today and you should NOT DRIVE or use heavy machinery until tomorrow (because of the sedation medicines used during the test).    FOLLOW UP: Our staff will call the number listed on your records the next business day following your procedure to check on you and address any questions or concerns that you may have regarding the information given to you following your procedure. If we do not reach you, we will leave a message.  However, if you are feeling well and you are not experiencing any problems, there is no need to return our call.  We will assume that you have returned to your regular daily activities without incident.  If any biopsies were taken you will be contacted by phone or by letter within the next 1-3 weeks.  Please call us at [phone] if you have not heard about the biopsies in 3 weeks.   Thank you for allowing us to provide for your healthcare needs today.  SIGNATURES/CONFIDENTIALITY: You and/or your care partner have signed paperwork which will be entered into your electronic medical record.  These signatures attest to the fact that that the information above on your After Visit Summary has been reviewed and is understood.  Full responsibility of the confidentiality of this discharge information lies with you and/or your care-partner. 

## 2018-09-09 NOTE — Op Note (Signed)
[facility] Patient Name: Michelle Park Procedure Date: 09/09/2018 1:06 PM MRN: [id number] Endoscopist: Justice Britain , MD Age: 68 Referring MD:  Date of Birth: October 19, 1950 Gender: Female Account #: [id number] Procedure:                Upper GI endoscopy Indications:              Exclusion of esophageal reflux, Nausea with                            vomiting, Weight loss Medicines:                Monitored Anesthesia Care Procedure:                Pre-Anesthesia Assessment:                           - Prior to the procedure, a History and Physical                            was performed, and patient medications and                            allergies were reviewed. The patient's tolerance of                            previous anesthesia was also reviewed. The risks                            and benefits of the procedure and the sedation                            options and risks were discussed with the patient.                            All questions were answered, and informed consent                            was obtained. Prior Anticoagulants: The patient has                            taken no previous anticoagulant or antiplatelet                            agents. ASA Grade Assessment: II - A patient with                            mild systemic disease. After reviewing the risks                            and benefits, the patient was deemed in                            satisfactory condition to undergo the procedure.  After obtaining informed consent, the endoscope was                            passed under direct vision. Throughout the                            procedure, the patient's blood pressure, pulse, and                            oxygen saturations were monitored continuously. The                            Endoscope was introduced through the mouth, and                            advanced to the second part of  duodenum. The upper                            GI endoscopy was accomplished without difficulty.                            The patient tolerated the procedure. Scope In: Scope Out: Findings:                 No gross lesions were noted in the proximal                            esophagus, in the mid esophagus and in the distal                            esophagus. Biopsies were taken with a cold forceps                            for histology from the proximal/middle/distal                            esophagus.                           Islands of salmon-colored mucosa were present at 37                            cm. No other visible abnormalities were present.                            The maximum longitudinal extent of these esophageal                            mucosal changes was 1 cm in length. Biopsies were                            taken with a cold forceps for histology.  No gross lesions were noted in the entire examined                            stomach. Biopsies were taken with a cold forceps                            for histology and Helicobacter pylori testing from                            the antrum/incisura/greater curve/lesser curve.                           No gross lesions were noted in the duodenal bulb,                            in the first portion of the duodenum and in the                            second portion of the duodenum. Biopsies were taken                            with a cold forceps for histology and to rule out                            Celiac disease. Complications:            No immediate complications. Estimated Blood Loss:     Estimated blood loss was minimal. Impression:               - No gross lesions in esophagus. Biopsied.                           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 Biopsied.                           - No gross lesions in  the stomach. Biopsied.                           - No gross lesions in the duodenal bulb, in the                            first portion of the duodenum and in the second                            portion of the duodenum. Biopsied. Recommendation:           - The patient will be observed post-procedure,                            until all discharge criteria are met.                           -  Discharge patient to home.                           - Patient has a contact number available for                            emergencies. The signs and symptoms of potential                            delayed complications were discussed with the                            patient. Return to normal activities tomorrow.                            Written discharge instructions were provided to the                            patient.                           - Resume previous diet.                           - Continue present medications.                           - Await pathology results.                           - The findings and recommendations were discussed                            with the patient.                           - The findings and recommendations were discussed                            with the designated responsible adult. Justice Britain, MD 09/09/2018 2:44:38 PM

## 2018-09-09 NOTE — Progress Notes (Signed)
Pt's states no medical or surgical changes since previsit or office visit. 

## 2018-09-09 NOTE — Progress Notes (Signed)
To PACU, VSS. Report to [email or web address] 

## 2018-09-10 ENCOUNTER — Telehealth: Payer: Self-pay | Admitting: *Deleted

## 2018-09-10 NOTE — Telephone Encounter (Signed)
  Follow up Call-  Call back number 09/09/2018  Post procedure Call Back phone  # [phone]  Permission to leave phone message Yes  Some recent data might be hidden     Patient questions:  Do you have a fever, pain , or abdominal swelling? No. Pain Score  0 *  Have you tolerated food without any problems? Yes.    Have you been able to return to your normal activities? Yes.    Do you have any questions about your discharge instructions: Diet   No. Medications  No. Follow up visit  No.  Do you have questions or concerns about your Care? No.  Actions: * If pain score is 4 or above: No action needed, pain <4.

## 2018-09-15 ENCOUNTER — Encounter: Payer: Self-pay | Admitting: Gastroenterology

## 2018-09-16 ENCOUNTER — Ambulatory Visit: Payer: Medicare Other | Admitting: Gastroenterology

## 2018-10-29 ENCOUNTER — Other Ambulatory Visit: Payer: Self-pay

## 2018-11-01 ENCOUNTER — Ambulatory Visit: Payer: Medicare Other | Admitting: Internal Medicine

## 2018-11-01 ENCOUNTER — Other Ambulatory Visit: Payer: Self-pay

## 2018-11-01 ENCOUNTER — Encounter: Payer: Self-pay | Admitting: Internal Medicine

## 2018-11-01 VITALS — BP 120/60 | HR 88 | Temp 98.6°F | Ht 64.5 in | Wt 209.0 lb

## 2018-11-01 DIAGNOSIS — E278 Other specified disorders of adrenal gland: Secondary | ICD-10-CM | POA: Diagnosis not present

## 2018-11-01 LAB — POTASSIUM: Potassium: 4.4 mEq/L (ref 3.5–5.1)

## 2018-11-01 MED ORDER — DEXAMETHASONE 1 MG PO TABS: ORAL_TABLET | ORAL | 0 refills | Status: DC

## 2018-11-01 NOTE — Progress Notes (Addendum)
Patient ID: Michelle Park, female   DOB: [birth year], 68 y.o.   MRN: [id number]    HPI  Michelle Park is a 68 y.o.-year-old female, referred by Dr. 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  Pt's adrenal mass was incidentally found during investigation for nausea and weight loss.  I reviewed pt's previous imaging tests: CT abdomen/pelvis with contrast (07/24/2018): 5.3 x 2.1 cm mass in left adrenal.    MRI abdomen (08/29/2018): 5.4 x 1.9 x 2.9 cm left adrenal mass with diffuse signal dropout in out of phase images consistent with an adrenal adenoma.  Right adrenal gland normal.  Pt. also has a history of HL, osteopenia, fibromyalgia, ? Barrett's esophagus, early cataracts.  She does not have a history of HTN, diabetes, prediabetes, but has had hyponatremia and 2 instances of mild hypokalemia:    Chemistry      Component Value Date/Time   NA 131 (L) 09/03/2018 1130   K 3.7 09/03/2018 1130   CL 96 09/03/2018 1130   CO2 28 09/03/2018 1130   BUN 13 09/03/2018 1130   CREATININE 0.91 09/03/2018 1130      Component Value Date/Time   CALCIUM 10.1 09/03/2018 1130   ALKPHOS 51 08/19/2018 1505   AST 10 08/19/2018 1505   ALT 13 08/19/2018 1505   BILITOT 0.3 08/19/2018 1505     Lab Results  Component Value Date   NA 131 (L) 09/03/2018   NA 130 (L) 08/27/2018   NA 124 (L) 08/19/2018   NA 126 (L) 08/02/2018   NA 128 (L) 07/24/2018   NA 133 (L) 03/21/2015   NA 135 07/16/2010   NA 137 07/12/2009   NA 131 (L) 09/06/2008   Lab Results  Component Value Date   K 3.7 09/03/2018   K 3.5 08/27/2018   K 3.6 08/19/2018   K 3.2 (L) 08/02/2018   K 3.2 (L) 07/24/2018   K 3.8 03/21/2015   K 4.1 07/16/2010   K 4.1 07/12/2009   K 4.0 09/06/2008    Of note, she is a smoker and smokes 1 pack a day.  She has a history of weight gain after retirement.  She retired early, at 68 years old and she thinks that she gained approximately 50 pounds since then.  She denies  new back pain other than history of radiating back pain for which she had surgery last year.  ROS: Constitutional: + weight gain, + fatigue, no subjective hyperthermia/hypothermia, + nocturia, + poor sleep Eyes: no blurry vision, no xerophthalmia ENT: no sore throat, no nodules palpated in throat, no dysphagia/odynophagia, + hoarseness Cardiovascular: no CP/SOB/+ palpitations/no leg swelling Respiratory: + Cough/no SOB/+ wheezing Gastrointestinal: + resolved N/no V/D/C Musculoskeletal: + Muscle/joint aches Skin: no rashes Neurological: no tremors/numbness/tingling/dizziness, plus headaches Psychiatric: + Both depression/anxiety + Low libido  Past Medical History:  Diagnosis Date   Allergic rhinitis due to pollen    Chronic back pain    Episodic mood disorder (HCC)    GERD (gastroesophageal reflux disease)    Hyperlipidemia    Hypertension    Past Surgical History:  Procedure Laterality Date   FOOT SURGERY  1979 and 1988   Social History   Socioeconomic History   Marital status: Divorced    Spouse name: Not on file   Number of children: 1   Years of education: Not on file   Highest education level: Not on file  Occupational History   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, product/process development strain: Not on [employer] insecurity:    Worry: Not on file    Inability: Not on file   Transportation needs:    Medical: Not on file    Non-medical: Not on file  Tobacco Use   Smoking status: Former Smoker    Packs/day: 1.00    Years: 45.00    Pack years: 45.00    Types: Cigarettes    Start date: 12/17/2015    Last attempt to quit: 12/17/2015    Years since quitting: 2.8   Smokeless tobacco: Never Used  Substance and Sexual Activity   Alcohol use: Yes    Alcohol/week: 0.0 standard drinks    Comment: rare glass of wine   Drug use: No   Sexual activity: Not on file  Lifestyle   Physical activity:    Days per week: Not on  file    Minutes per session: Not on file   Stress: Not on file  Relationships   Social connections:    Talks on phone: Not on file    Gets together: Not on file    Attends religious service: Not on file    Active member of club or organization: Not on file    Attends meetings of clubs or organizations: Not on file    Relationship status: Not on file   Intimate partner violence:    Fear of current or ex partner: Not on file    Emotionally abused: Not on file    Physically abused: Not on file    Forced sexual activity: Not on file  Other Topics Concern   Not on file  Social History Narrative   Goes by [employer].   1 daughter   Current Outpatient Medications on File Prior to Visit  Medication Sig Dispense Refill   ALPRAZolam (XANAX) 0.5 MG tablet Take 1 tablet (0.5 mg total) by mouth 2 (two) times daily as needed. 60 tablet 0   calcium-vitamin D (OSCAL WITH D) 500-200 MG-UNIT per tablet 1500 mg per day for low bone density     escitalopram (LEXAPRO) 10 MG tablet Take 10 mg by mouth daily.     fenofibrate 160 MG tablet Take 160 mg by mouth daily.     losartan-hydrochlorothiazide (HYZAAR) 100-25 MG tablet Take 1 tablet by mouth daily. 90 tablet 3   Multiple Vitamin (MULTIVITAMIN) capsule Take 1 capsule by mouth daily.     omeprazole (PRILOSEC) 40 MG capsule Take 1 capsule (40 mg total) by mouth 2 (two) times daily. 60 capsule 3   Current Facility-Administered Medications on File Prior to Visit  Medication Dose Route Frequency Provider Last Rate Last Dose   0.9 %  sodium chloride infusion  500 mL Intravenous Once Mansouraty, Telford Nab., MD       Allergies  Allergen Reactions   Aspirin    Atorvastatin     REACTION: causes leg pain   Rosuvastatin Other (See Comments)    Myalgias   Family History  Problem Relation Age of Onset   Heart disease Mother    Alzheimer's disease Mother    Cancer Sister    Colon cancer Neg Hx    Esophageal cancer Neg Hx     Inflammatory bowel disease Neg Hx    Liver disease Neg Hx    Pancreatic cancer Neg Hx    Rectal cancer Neg Hx    Stomach cancer Neg Hx    PE: BP 120/60  Pulse 88    Temp 98.6 F (37 C)    Ht 5' 4.5" (1.638 m)    Wt 209 lb (94.8 kg)    SpO2 95%    BMI 35.32 kg/m  Wt Readings from Last 3 Encounters:  11/01/18 209 lb (94.8 kg)  08/19/18 204 lb 8 oz (92.8 kg)  08/02/18 206 lb (93.4 kg)   Constitutional: overweight, in NAD Eyes: PERRLA, EOMI, no exophthalmos, no lid lag, no stare ENT: moist mucous membranes, no thyromegaly, no cervical lymphadenopathy Cardiovascular: RRR, No RG, +1/6 SEM Respiratory: CTA B Gastrointestinal: abdomen soft, NT, ND, BS+ Musculoskeletal: no deformities, strength intact in all 4 Skin: moist, warm, no rashes Neurological: + Mild tremor with outstretched hands, DTR normal in all 4  ASSESSMENT: 1. Adrenal incidentaloma  PLAN:  1. Patient with a L adrenal nodule discovered incidentally during investigation for nausea. This has now resolved.  - we reviewed together the images of her latest abdominal CT scan showing the adenoma - We discussed about the fact that there are 3 possible scenarios: - A nonfunctioning adrenal nodule - A functioning adrenal adenoma - which can hypersecrete catecholamines/metanephrines, cortisol, or aldosterone - Adrenal cancer/metastasis We do not have blood tests to check for cancer, but the best indicator is lack of change in size and appearance over time. However, her mass is >5 cm, and usually adrenalectomy is recommended for such large masses.  - To differentiate between a functioning and a nonfunctioning adrenal nodule, we'll need to rule out hypersecretion by checking the following tests  - dexamethasone suppression test to rule out Cushing syndrome (6% of adrenal incidentalomas) - If the cortisol level returns >5, will need 24h urine free cortisol - Plasma fractionated metanephrines and catecholamines to rule out  pheochromocytoma (3% of adrenal incidentalomas) - Plasma renin activity and aldosterone level to rule out primary hyperaldosteronism (0.6% of adrenal incidentalomas) - I ordered the above tests today. I advised pt to take the dexamethasone tablets (1 mg total dose, sent to pharmacy) at 11 PM the night before coming to the lab to have a cortisol level drawn. I also added dexamethasone level. - If she decides not to have the surgery, we can wait for another year and then have her back for repeat hormonal testing and ordering a dedicated adrenal CT.  - we discussed about f/u in case she does not have the sx:   hormonal testing yearly for 5 years   CT scans yearly x1-2 or more 2/2 size -If she does have the surgery, I will see her back up to 1 month after this -We did discuss about the surgery the fact that it most likely will be laparoscopic and she most likely will need to be in the hospital until the next day (upon her questioning) -She agrees with the plan  Orders Placed This Encounter  Procedures   Metanephrines, plasma   Catecholamines, fractionated, plasma   Aldosterone + renin activity w/ ratio   Potassium   Office Visit on 11/01/2018  Component Date Value Ref Range Status   Metanephrine, Free 11/01/2018 50  <=57 pg/mL Final   Comment: . This test was developed and its analytical performance characteristics have been determined by [place], [place]. It has not been cleared or approved by the U.S. Food and Drug Administration. This assay has been validated pursuant to the CLIA regulations and is used for clinical purposes. Darol Destine, Free 11/01/2018 162* <=148 pg/mL Final   Comment: .  This test was developed and its analytical performance characteristics have been determined by [place], [place]. It has not been cleared or approved by the U.S. Food and Drug Administration. This assay has been  validated pursuant to the CLIA regulations and is used for clinical purposes. .    Total Metanephrines-Plasma 11/01/2018 212* <=205 pg/mL Final   Comment: . For additional information, please refer to http://education.questdiagnostics.com/faq/MetFractFree (This link is being provided for informational/educatio informational/educational purposes only.) . Elevations >4-fold upper reference range: strongly suggestive of a pheochromocytoma(1). . Elevations >1- 4-fold upper reference range: significant but not diagnostic, may be due to medications or stress. Suggest running 24 hr urine fractionated metanephrines and/or serum Chromagranin A for confirmation. . Reference: . (1)Algeciras-Schimnich A et al, Plasma Chromogranin A or Urine Fractionated Metanephrines Follow-Up Testing Improves the Diagnostic Accuracy of Plasma Fractionated Metanephrines for Pheochromocytoma. The Journal of Clinical Endocrinology # Metabolism 93(1), 91-95, 2008. . . . This test was developed and its analytical performance characteristics have been determined by [place], [place]. It has not been cleared or a                          pproved by the U.S. Food and Drug Administration. This assay has been validated pursuant to the CLIA regulations and is used for clinical purposes. Marland Kitchen    Epinephrine 11/01/2018 45  pg/mL Final   Comment: . This test was developed and its analytical performance characteristics have been determined by [facility]. It has not been cleared or approved by FDA. This assay has been validated pursuant to the CLIA regulations and is used for clinical purposes.    Norepinephrine 11/01/2018 495  pg/mL Final   Comment: . This test was developed and its analytical performance characteristics have been determined by [facility]. It has not been cleared or  approved by FDA. This assay has been validated pursuant to the CLIA regulations and is used for clinical purposes.    Dopamine 11/01/2018 31* pg/mL Final   Comment: . This test was developed and its analytical performance characteristics have been determined by [facility]. It has not been cleared or approved by FDA. This assay has been validated pursuant to the CLIA regulations and is used for clinical purposes.    Total Catecholamines 11/01/2018 571  pg/mL Final   Comment: . Adult Reference Ranges for Catecholamines, Plasma . Epinephrine           Supine:  <50 pg/mL                       Upright: <95 pg/mL . Norepinephrine        Supine:  112-658 pg/mL                       Upright: [phone] pg/mL . Dopamine              Supine:  <10 pg/mL                       Upright: <20 pg/mL . Total (N+E+D)         Supine:  123-671 pg/mL                       Upright: [phone] pg/mL . Pediatric Reference Ranges  for Catecholamines, Plasma . Due to stress, plasma catecholamine levels are generally unreliable in infants and small children. Urinary catecholamine assays are more reliable. Marland Kitchen Epinephrine    3-15 Years         Supine:  < or = 464 pg/mL                       Upright: No Reference                                Range Available . Norepinephrine    3-15 Years         Supine:  < or = 1251 pg/mL                       Upright: No Reference                                Range Available . Dopamine    3-15 Years         Supine:  <60 p                          g/mL                       Upright: No Reference                                Range Available . Pediatric data from [facility] [phone]. . This test was developed and its analytical performance characteristics have been determined by [facility]. It has not been cleared or approved by FDA. This assay has been  validated pursuant to the CLIA regulations and is used for clinical purposes.    Aldosterone 11/01/2018 5  ng/dL Final   Comment: . Unable to flag abnormal result(s), please refer     to reference range(s) below: . Adult Reference Ranges for Aldosterone, LC/MS/MS:     Upright  8:00 - 10:00 am    < or = 28 ng/dL     Upright  4:00 -  6:00 pm    < or = 21 ng/dL     Supine   8:00 - 10:00 am       3 - 16 ng/dL .    Renin Activity 11/01/2018 0.63  0.25 - 5.82 ng/mL/h Final   ALDO / PRA Ratio 11/01/2018 7.9  0.9 - 28.9 Ratio Final   Comment: . This test was developed and its analytical performance characteristics have been determined by [place], [place]. It has not been cleared or approved by the U.S. Food and Drug Administration. This assay has been validated pursuant to the CLIA regulations and is used for clinical purposes. .    Potassium 11/01/2018 4.4  3.5 - 5.1 mEq/L Final   Mild nonspecific increase in normetanephrines.  I advised the patient to perform a 24-hour urine collection for fractionated metanephrines.  Catecholamine levels normal, except slightly elevated dopamine, not worrisome.  Addendum (11/25/2018): Received 24-hour urine potassium results from [facility]: 48 mmol per 24 hours (14-95); urine osmolality 229, serum magnesium 1.8 (1.6-2.3).  Addendum (01/03/2019): 24-hour urine metanephrines normal: Component     Latest Ref Rng & Units  12/30/2018  Volume, Urine-VMAUR     mL 1,850  Metanephrines, Ur     90 - 315 mcg/24 h 111  Normetanephr.,U,24h     122 - 676 mcg/24 h 253  Metanephrine, Ur     224 - 832 mcg/24 h 364   At this point, there is no evidence that patient's adrenal mass is producing excessive amounts of hormones.  I will clear her for surgery from the adrenal point of view.  Philemon Kingdom, MD PhD [facility] Endocrinology

## 2018-11-01 NOTE — Patient Instructions (Addendum)
Please stop at the lab.  Take Dexamethasone at 1 am before coming for labs at 11 am the day after. Please come fasting.    Please come back for a follow-up appointment within 1 mo after the surgery - end of August.

## 2018-11-04 ENCOUNTER — Other Ambulatory Visit: Payer: Medicare Other

## 2018-11-05 ENCOUNTER — Other Ambulatory Visit: Payer: Self-pay

## 2018-11-05 ENCOUNTER — Other Ambulatory Visit (INDEPENDENT_AMBULATORY_CARE_PROVIDER_SITE_OTHER): Payer: Medicare Other

## 2018-11-05 DIAGNOSIS — E278 Other specified disorders of adrenal gland: Secondary | ICD-10-CM | POA: Diagnosis not present

## 2018-11-06 LAB — METANEPHRINES, PLASMA
Metanephrine, Free: 50 pg/mL (ref <=57)
Normetanephrine, Free: 162 pg/mL — ABNORMAL HIGH (ref <=148)
TOTAL METANEPHRINES-PLASMA: 212 pg/mL — AB (ref <=205)

## 2018-11-06 LAB — CATECHOLAMINES, FRACTIONATED, PLASMA
Dopamine: 31 pg/mL — ABNORMAL HIGH
Epinephrine: 45 pg/mL
Norepinephrine: 495 pg/mL
Total Catecholamines: 571 pg/mL

## 2018-11-06 LAB — ALDOSTERONE + RENIN ACTIVITY W/ RATIO
ALDO / PRA Ratio: 7.9 Ratio (ref 0.9–28.9)
ALDOSTERONE: 5 ng/dL
RENIN ACTIVITY: 0.63 ng/mL/h (ref 0.25–5.82)

## 2018-11-08 ENCOUNTER — Other Ambulatory Visit: Payer: Self-pay | Admitting: Internal Medicine

## 2018-11-08 DIAGNOSIS — E278 Other specified disorders of adrenal gland: Secondary | ICD-10-CM

## 2018-11-09 LAB — DEXAMETHASONE, BLOOD: Dexamethasone, Serum: 133 ng/dL

## 2018-11-09 LAB — CORTISOL-AM, BLOOD: Cortisol - AM: 2.6 ug/dL — ABNORMAL LOW

## 2018-11-25 ENCOUNTER — Telehealth: Payer: Self-pay

## 2018-11-25 NOTE — Telephone Encounter (Signed)
Received patient's recent urine tests from PCP and given to Dr. Cruzita Lederer.

## 2018-12-02 ENCOUNTER — Telehealth: Payer: Self-pay | Admitting: Internal Medicine

## 2018-12-02 NOTE — Telephone Encounter (Signed)
Per [facility], " Caller states Dr.Gherghe was supposed to send her lab work and other things over to her surgery Dr. She is calling to see if that has been sent."  Placed in box.

## 2018-12-07 ENCOUNTER — Encounter: Payer: Self-pay | Admitting: Internal Medicine

## 2018-12-28 ENCOUNTER — Other Ambulatory Visit: Payer: Self-pay

## 2018-12-30 ENCOUNTER — Other Ambulatory Visit: Payer: Self-pay

## 2018-12-30 ENCOUNTER — Other Ambulatory Visit: Payer: Medicare Other

## 2018-12-30 DIAGNOSIS — E278 Other specified disorders of adrenal gland: Secondary | ICD-10-CM

## 2019-01-03 LAB — METANEPHRINES, URINE, 24 HOUR
Metaneph Total, Ur: 364 mcg/24 h (ref 224–832)
Metanephrines, Ur: 111 mcg/24 h (ref 90–315)
Normetanephrine, 24H Ur: 253 mcg/24 h (ref 122–676)
Volume, Urine-VMAUR: 1850 mL

## 2019-01-10 ENCOUNTER — Ambulatory Visit: Payer: Self-pay | Admitting: General Surgery

## 2019-01-10 NOTE — H&P (Signed)
History of Present Illness Michelle Ok MD; 01/10/2019 10:51 AM) The patient is a 68 year old female who presents wtih an adrenal mass. Patient follow back up with a after completion of her adenoma screening by Dr. Renne Crigler. Patient has had her left adrenal mass ruled out for active secretion. Patient has had no new complaints since her last visit. We discussed surgery and anatomy in detail on today's visit.  -------------------------------------- Patient follow back up today secondary to left adrenal mass. Patient is seen Dr. Shellia Cleverly for active adenoma screening. Patient has undergone multiple laboratory studies. Patient's total metanephrines were slightly elevated at 212 and her normetanephrine was slightly elevated at 162. Both of these are normally less than 205 and 148 respectively. Patient states that she has previous 24 hour urine test done in January, however is not sure if metanephrines were tested at that time.  Patient states she's had some dizzy spells recently however is unsure of this is related to vertigo. She denies any other further symptoms at this time.  -----------------------------  Patient is a 68 year old female who is referred by Ellin Goodie, M.D. Patient has a chief complaint of a left adrenal mass Patient has a history of anxiety, hypercholesterolemia, hypertension, who comes in with a left-sided adrenal mass. Patient was found to have this left-sided renal mass after having nausea proceeding to the ER. Patient underwent CT scan which revealed a 5.4 x 1.9 x 2.9 cm adrenal mass. I did review this personally.  Patient's has had no issues with any hypertension, has had issues with hyponatremia however to hyperkalemia.  Patient has had a sister who had lung cancer that was a smoker.   Past Surgical History Emeline Gins, CMA; 01/10/2019 10:26 AM) Cataract Surgery  Bilateral. Colon Polyp Removal - Colonoscopy  Foot Surgery  Left. Oral Surgery  Spinal  Surgery - Lower Back  Tonsillectomy   Diagnostic Studies History Emeline Gins, CMA; 01/10/2019 10:26 AM) Colonoscopy  >10 years ago Mammogram  1-3 years ago  Allergies Emeline Gins, [place]; 01/10/2019 10:27 AM) Crestor *ANTIHYPERLIPIDEMICS*  Lipitor *ANTIHYPERLIPIDEMICS*  Allergies Reconciled   Medication History Emeline Gins, CMA; 01/10/2019 10:27 AM) ALPRAZolam (0.5MG  Tablet, Oral) Active. Escitalopram Oxalate (20MG  Tablet, Oral) Active. Omeprazole (40MG  Capsule DR, Oral) Active. Losartan Potassium-HCTZ (100-25MG  Tablet, Oral) Active. Medications Reconciled  Social History Emeline Gins, [place]; 01/10/2019 10:26 AM) Alcohol use  Remotely quit alcohol use. Caffeine use  Carbonated beverages, Coffee, Tea. Illicit drug use  Remotely quit drug use. Tobacco use  Current every day smoker.  Family History Emeline Gins, [place]; 01/10/2019 10:26 AM) Alcohol Abuse  Brother, Daughter, Father. Cerebrovascular Accident  Mother. Heart Disease  Mother.  Pregnancy / Birth History Emeline Gins, [place]; 01/10/2019 10:26 AM) Age at menarche  59 years. Age of menopause  <44 Contraceptive History  Intrauterine device. Gravida  2 Irregular periods  Maternal age  66-20 Para  1  Other Problems Emeline Gins, [place]; 01/10/2019 10:26 AM) Anxiety Disorder  Back Pain  Chronic Obstructive Lung Disease  Depression  Gastroesophageal Reflux Disease  Heart murmur  High blood pressure  Other disease, cancer, significant illness  Sleep Apnea     Review of Systems Emeline Gins CMA; 01/10/2019 10:26 AM) General Present- Fatigue. Not Present- Appetite Loss, Chills, Fever, Night Sweats, Weight Gain and Weight Loss. Skin Present- Dryness and Rash. Not Present- Change in Wart/Mole, Hives, Jaundice, New Lesions, Non-Healing Wounds and Ulcer. HEENT Present- Hoarseness, Ringing in the Ears, Seasonal Allergies, Sinus Pain and Visual Disturbances. Not Present- Earache,  Hearing  Loss, Nose Bleed, Oral Ulcers, Sore Throat, Wears glasses/contact lenses and Yellow Eyes. Respiratory Present- Chronic Cough, Difficulty Breathing, Snoring and Wheezing. Not Present- Bloody sputum. Breast Not Present- Breast Mass, Breast Pain, Nipple Discharge and Skin Changes. Cardiovascular Present- Palpitations, Rapid Heart Rate and Shortness of Breath. Not Present- Chest Pain, Difficulty Breathing Lying Down, Leg Cramps and Swelling of Extremities. Gastrointestinal Present- Difficulty Swallowing and Indigestion. Not Present- Abdominal Pain, Bloating, Bloody Stool, Change in Bowel Habits, Chronic diarrhea, Constipation, Excessive gas, Gets full quickly at meals, Hemorrhoids, Nausea, Rectal Pain and Vomiting. Female Genitourinary Present- Nocturia. Not Present- Frequency, Painful Urination, Pelvic Pain and Urgency. Musculoskeletal Present- Back Pain, Muscle Pain and Muscle Weakness. Not Present- Joint Pain, Joint Stiffness and Swelling of Extremities. Neurological Present- Decreased Memory, Fainting and Headaches. Not Present- Numbness, Seizures, Tingling, Tremor, Trouble walking and Weakness. Psychiatric Present- Anxiety, Change in Sleep Pattern and Depression. Not Present- Bipolar, Fearful and Frequent crying. Endocrine Present- Cold Intolerance and Heat Intolerance. Not Present- Excessive Hunger, Hair Changes, Hot flashes and New Diabetes. Hematology Present- Easy Bruising. Not Present- Blood Thinners, Excessive bleeding, Gland problems, HIV and Persistent Infections.  Vitals Emeline Gins CMA; 01/10/2019 10:27 AM) 01/10/2019 10:26 AM Weight: 206.4 lb Height: 64in Body Surface Area: 1.98 m Body Mass Index: 35.43 kg/m  Temp.: 98.87F  Pulse: 79 (Regular)  BP: 150/86 (Sitting, Left Arm, Standard)       Physical Exam Michelle Ok, MD; 01/10/2019 10:52 AM) General Mental Status-Alert. General Appearance-Consistent with stated age. Posture-Normal  posture. Voice-Normal.  Integumentary Note: no rash or lesion on limited exam   Head and Neck Head -Note: atraumatic, normocephalic.  Face Strength and Tone - facial muscle strength and tone is normal.  Eye Eyeball - Bilateral-Extraocular movements intact. Sclera/Conjunctiva - Bilateral-Normal.  Chest and Lung Exam Chest and lung exam reveals -quiet, even and easy respiratory effort with no use of accessory muscles.  Neuropsychiatric Mental status exam performed with findings of-able to articulate well with normal speech/language, rate, volume and coherence. The patient's mood and affect are described as -normal. Judgment and Insight-insight is appropriate concerning matters relevant to self and the patient displays appropriate judgment regarding every day activities. Thought Processes/Cognitive Function-aware of current events.  Musculoskeletal Note: strength symmetrical throughout, no deformity     Assessment & Plan Michelle Ok MD; 01/10/2019 10:52 AM) LEFT ADRENAL MASS (E27.8) Impression: Patient is a 68 year old female with a history of hyperlipidemia, hypertension, anxiety, who comes in with a left 5.4 x 1.9 x 2.9 cm adrenal mass. Patient has been cleared from an active secreting adrenal mass. Patient will like to continue to proceed with surgery. 1. Will proceed to the operating room for a robotic left adrenalectomy. 2. I discussed with her the anatomy in the procedure in detail. I discussed with her the risks and benefits of the procedure to include but not limited to: Infection, bleeding, damage to structures, possible need for further surgery. The patient voiced understanding and wishes to proceed.

## 2019-01-19 ENCOUNTER — Encounter: Payer: Self-pay | Admitting: Gastroenterology

## 2019-01-24 ENCOUNTER — Other Ambulatory Visit: Payer: Self-pay | Admitting: Gastroenterology

## 2019-03-24 NOTE — Patient Instructions (Signed)
YOU NEED TO HAVE A COVID 19 TEST ON_8-25-20______ @_______ , THIS TEST MUST BE DONE BEFORE SURGERY, COME  [address] [place] , [id number]. ONCE YOUR COVID TEST IS COMPLETED, PLEASE BEGIN THE QUARANTINE INSTRUCTIONS AS OUTLINED IN YOUR HANDOUT.                ANAISSA MACFADDEN  03/24/2019   Your procedure is scheduled on: 04-01-19  Report to [facility] Main  Entrance              Report to admitting at           1200 PM   [address] [place]. NO VISITORS ARE ALLOWED IN SHORT STAY OR RECOVERY ROOM.   Call this number if you have problems the morning of surgery [phone]    Remember: Do not eat food:After Midnight. You may have clear liquids until 0800 am then nothing by mouth    CLEAR LIQUID DIET   Foods Allowed                                                                     Foods Excluded  Coffee and tea, regular and decaf                             liquids that you cannot  Plain Jell-O any favor except red or purple                                           see through such as: Fruit ices (not with fruit pulp)                                     milk, soups, orange juice  Iced Popsicles                                    All solid food Carbonated beverages, regular and diet                                    Cranberry, grape and apple juices Sports drinks like Gatorade Lightly seasoned clear broth or consume(fat free) Sugar, honey syrup  Sample Menu Breakfast                                Lunch                                     Supper Cranberry juice                    Beef broth  Chicken broth Jell-O                                     Grape juice                           Apple juice Coffee or tea                        Jell-O                                      Popsicle                                                Coffee or tea                        Coffee or  tea  _____________________________________________________________________   BRUSH YOUR TEETH MORNING OF SURGERY AND RINSE YOUR MOUTH OUT, NO CHEWING GUM CANDY OR MINTS.     Take these medicines the morning of surgery with A SIP OF WATER:omeprazole , xanax if needed                                 You may not have any metal on your body including hair pins and              piercings  Do not wear jewelry, make-up, lotions, powders or perfumes, deodorant             Do not wear nail polish.  Do not shave  48 hours prior to surgery.                 Do not bring valuables to the hospital. [place].  Contacts, dentures or bridgework may not be worn into surgery.                Please read over the following fact sheets you were given: _____________________________________________________________________           [facility] - Preparing for Surgery Before surgery, you can play an important role.  Because skin is not sterile, your skin needs to be as free of germs as possible.  You can reduce the number of germs on your skin by washing with CHG (chlorahexidine gluconate) soap before surgery.  CHG is an antiseptic cleaner which kills germs and bonds with the skin to continue killing germs even after washing. Please DO NOT use if you have an allergy to CHG or antibacterial soaps.  If your skin becomes reddened/irritated stop using the CHG and inform your nurse when you arrive at Short Stay. Do not shave (including legs and underarms) for at least 48 hours prior to the first CHG shower.  You may shave your face/neck. Please follow these instructions carefully:  1.  Shower with CHG Soap the night before surgery and the  morning of Surgery.  2.  If you choose to wash  your hair, wash your hair first as usual with your  normal  shampoo.  3.  After you shampoo, rinse your hair and body thoroughly to remove the  shampoo.                           4.   Use CHG as you would any other liquid soap.  You can apply chg directly  to the skin and wash                       Gently with a scrungie or clean washcloth.  5.  Apply the CHG Soap to your body ONLY FROM THE NECK DOWN.   Do not use on face/ open                           Wound or open sores. Avoid contact with eyes, ears mouth and genitals (private parts).                       Wash face,  Genitals (private parts) with your normal soap.             6.  Wash thoroughly, paying special attention to the area where your surgery  will be performed.  7.  Thoroughly rinse your body with warm water from the neck down.  8.  DO NOT shower/wash with your normal soap after using and rinsing off  the CHG Soap.                9.  Pat yourself dry with a clean towel.            10.  Wear clean pajamas.            11.  Place clean sheets on your bed the night of your first shower and do not  sleep with pets. Day of Surgery : Do not apply any lotions/deodorants the morning of surgery.  Please wear clean clothes to the hospital/surgery center.  FAILURE TO FOLLOW THESE INSTRUCTIONS MAY RESULT IN THE CANCELLATION OF YOUR SURGERY PATIENT SIGNATURE_________________________________  NURSE SIGNATURE__________________________________  ________________________________________________________________________

## 2019-03-24 NOTE — Progress Notes (Signed)
ekg 08-03-18 epic  cxr 08-02-18 epic

## 2019-03-25 ENCOUNTER — Encounter (HOSPITAL_COMMUNITY): Payer: Self-pay

## 2019-03-25 ENCOUNTER — Encounter (HOSPITAL_COMMUNITY)
Admission: RE | Admit: 2019-03-25 | Discharge: 2019-03-25 | Disposition: A | Discharge status: Home / Self Care | Payer: Medicare Other | Source: Ambulatory Visit | Attending: General Surgery | Admitting: General Surgery

## 2019-03-25 ENCOUNTER — Other Ambulatory Visit: Payer: Self-pay

## 2019-03-25 DIAGNOSIS — F419 Anxiety disorder, unspecified: Secondary | ICD-10-CM | POA: Insufficient documentation

## 2019-03-25 DIAGNOSIS — E7800 Pure hypercholesterolemia, unspecified: Secondary | ICD-10-CM | POA: Diagnosis not present

## 2019-03-25 DIAGNOSIS — I10 Essential (primary) hypertension: Secondary | ICD-10-CM | POA: Insufficient documentation

## 2019-03-25 DIAGNOSIS — Z79899 Other long term (current) drug therapy: Secondary | ICD-10-CM | POA: Insufficient documentation

## 2019-03-25 DIAGNOSIS — E278 Other specified disorders of adrenal gland: Secondary | ICD-10-CM | POA: Diagnosis not present

## 2019-03-25 DIAGNOSIS — Z01812 Encounter for preprocedural laboratory examination: Secondary | ICD-10-CM | POA: Insufficient documentation

## 2019-03-25 DIAGNOSIS — F17210 Nicotine dependence, cigarettes, uncomplicated: Secondary | ICD-10-CM | POA: Insufficient documentation

## 2019-03-25 HISTORY — DX: Headache, unspecified: R51.9

## 2019-03-25 HISTORY — DX: Bronchitis, not specified as acute or chronic: J40

## 2019-03-25 HISTORY — DX: Depression, unspecified: F32.A

## 2019-03-25 HISTORY — DX: Chronic obstructive pulmonary disease, unspecified: J44.9

## 2019-03-25 HISTORY — DX: Fibromyalgia: M79.7

## 2019-03-25 HISTORY — DX: Restless legs syndrome: G25.81

## 2019-03-25 HISTORY — DX: Cardiac murmur, unspecified: R01.1

## 2019-03-25 HISTORY — DX: Dyspnea, unspecified: R06.00

## 2019-03-25 HISTORY — DX: Anxiety disorder, unspecified: F41.9

## 2019-03-25 LAB — CBC
HCT: 40.8 % (ref 36.0–46.0)
Hemoglobin: 13.1 g/dL (ref 12.0–15.0)
MCH: 28.7 pg (ref 26.0–34.0)
MCHC: 32.1 g/dL (ref 30.0–36.0)
MCV: 89.5 fL (ref 80.0–100.0)
Platelets: 464 10*3/uL — ABNORMAL HIGH (ref 150–400)
RBC: 4.56 MIL/uL (ref 3.87–5.11)
RDW: 14.6 % (ref 11.5–15.5)
WBC: 8 10*3/uL (ref 4.0–10.5)
nRBC: 0 % (ref 0.0–0.2)

## 2019-03-25 LAB — BASIC METABOLIC PANEL
Anion gap: 8 (ref 5–15)
BUN: 19 mg/dL (ref 8–23)
CO2: 26 mmol/L (ref 22–32)
Calcium: 10.1 mg/dL (ref 8.9–10.3)
Chloride: 100 mmol/L (ref 98–111)
Creatinine, Ser: 0.94 mg/dL (ref 0.44–1.00)
GFR calc Af Amer: >60 mL/min (ref >60)
GFR calc non Af Amer: >60 mL/min (ref >60)
Glucose, Bld: 99 mg/dL (ref 70–99)
Potassium: 4.7 mmol/L (ref 3.5–5.1)
Sodium: 134 mmol/L — ABNORMAL LOW (ref 135–145)

## 2019-03-25 NOTE — Progress Notes (Signed)
PCP -  Donnie Coffin Cardiologist - none  Chest x-ray - 08-02-18 epic EKG - 08-03-18 epic Stress Test -  ECHO -  Cardiac Cath -   Sleep Study - yes CPAP - no cpap pt passed by one point  Fasting Blood Sugar - NDM Checks Blood Sugar _____ times a day  Blood Thinner Instructions: none Aspirin Instructions: Last Dose:  Anesthesia review:   Patient denies shortness of breath, fever, cough and chest pain at PAT appointment . Asymptomatic at preop of above   Patient verbalized understanding of instructions that were given to them at the PAT appointment. Patient was also instructed that they will need to review over the PAT instructions again at home before surgery.

## 2019-03-29 ENCOUNTER — Other Ambulatory Visit (HOSPITAL_COMMUNITY)
Admission: RE | Admit: 2019-03-29 | Discharge: 2019-03-29 | Disposition: A | Discharge status: Home / Self Care | Payer: Medicare Other | Source: Ambulatory Visit | Attending: General Surgery | Admitting: General Surgery

## 2019-03-29 DIAGNOSIS — Z20828 Contact with and (suspected) exposure to other viral communicable diseases: Secondary | ICD-10-CM | POA: Diagnosis not present

## 2019-03-29 DIAGNOSIS — Z01812 Encounter for preprocedural laboratory examination: Secondary | ICD-10-CM | POA: Diagnosis not present

## 2019-03-29 LAB — SARS CORONAVIRUS 2 (TAT 6-24 HRS): SARS Coronavirus 2: NEGATIVE

## 2019-03-31 NOTE — H&P (Signed)
History of Present Illness The patient is a 68 year old female who presents wtih an adrenal mass. Patient follow back up with a after completion of her adenoma screening by Dr. Renne Crigler. Patient has had her left adrenal mass ruled out for active secretion. Patient has had no new complaints since her last visit. We discussed surgery and anatomy in detail on today's visit.  -------------------------------------- Patient follow back up today secondary to left adrenal mass. Patient is seen Dr. Shellia Cleverly for active adenoma screening. Patient has undergone multiple laboratory studies. Patient's total metanephrines were slightly elevated at 212 and her normetanephrine was slightly elevated at 162. Both of these are normally less than 205 and 148 respectively. Patient states that she has previous 24 hour urine test done in January, however is not sure if metanephrines were tested at that time.  Patient states she's had some dizzy spells recently however is unsure of this is related to vertigo. She denies any other further symptoms at this time.  -----------------------------  Patient is a 68 year old female who is referred by Ellin Goodie, M.D. Patient has a chief complaint of a left adrenal mass Patient has a history of anxiety, hypercholesterolemia, hypertension, who comes in with a left-sided adrenal mass. Patient was found to have this left-sided renal mass after having nausea proceeding to the ER. Patient underwent CT scan which revealed a 5.4 x 1.9 x 2.9 cm adrenal mass. I did review this personally.  Patient's has had no issues with any hypertension, has had issues with hyponatremia however to hyperkalemia.  Patient has had a sister who had lung cancer that was a smoker.   Past Surgical History  Cataract Surgery  Bilateral. Colon Polyp Removal - Colonoscopy  Foot Surgery  Left. Oral Surgery  Spinal Surgery - Lower Back  Tonsillectomy   Diagnostic Studies History   Colonoscopy  >10 years ago Mammogram  1-3 years ago  Allergies Crestor *ANTIHYPERLIPIDEMICS*  Lipitor *ANTIHYPERLIPIDEMICS*  Allergies Reconciled   Medication History  ALPRAZolam (0.5MG  Tablet, Oral) Active. Escitalopram Oxalate (20MG  Tablet, Oral) Active. Omeprazole (40MG  Capsule DR, Oral) Active. Losartan Potassium-HCTZ (100-25MG  Tablet, Oral) Active. Medications Reconciled  Social History  Alcohol use  Remotely quit alcohol use. Caffeine use  Carbonated beverages, Coffee, Tea. Illicit drug use  Remotely quit drug use. Tobacco use  Current every day smoker.  Family History  Alcohol Abuse  Brother, Daughter, Father. Cerebrovascular Accident  Mother. Heart Disease  Mother.  Pregnancy / Birth History  Age at menarche  16 years. Age of menopause  <44 Contraceptive History  Intrauterine device. Gravida  2 Irregular periods  Maternal age  11-20 Para  1  Other Problems  Anxiety Disorder  Back Pain  Chronic Obstructive Lung Disease  Depression  Gastroesophageal Reflux Disease  Heart murmur  High blood pressure  Other disease, cancer, significant illness  Sleep Apnea     Review of Systems General Present- Fatigue. Not Present- Appetite Loss, Chills, Fever, Night Sweats, Weight Gain and Weight Loss. Skin Present- Dryness and Rash. Not Present- Change in Wart/Mole, Hives, Jaundice, New Lesions, Non-Healing Wounds and Ulcer. HEENT Present- Hoarseness, Ringing in the Ears, Seasonal Allergies, Sinus Pain and Visual Disturbances. Not Present- Earache, Hearing Loss, Nose Bleed, Oral Ulcers, Sore Throat, Wears glasses/contact lenses and Yellow Eyes. Respiratory Present- Chronic Cough, Difficulty Breathing, Snoring and Wheezing. Not Present- Bloody sputum. Breast Not Present- Breast Mass, Breast Pain, Nipple Discharge and Skin Changes. Cardiovascular Present- Palpitations, Rapid Heart Rate and Shortness of Breath. Not Present- Chest  Pain, Difficulty  Breathing Lying Down, Leg Cramps and Swelling of Extremities. Gastrointestinal Present- Difficulty Swallowing and Indigestion. Not Present- Abdominal Pain, Bloating, Bloody Stool, Change in Bowel Habits, Chronic diarrhea, Constipation, Excessive gas, Gets full quickly at meals, Hemorrhoids, Nausea, Rectal Pain and Vomiting. Female Genitourinary Present- Nocturia. Not Present- Frequency, Painful Urination, Pelvic Pain and Urgency. Musculoskeletal Present- Back Pain, Muscle Pain and Muscle Weakness. Not Present- Joint Pain, Joint Stiffness and Swelling of Extremities. Neurological Present- Decreased Memory, Fainting and Headaches. Not Present- Numbness, Seizures, Tingling, Tremor, Trouble walking and Weakness. Psychiatric Present- Anxiety, Change in Sleep Pattern and Depression. Not Present- Bipolar, Fearful and Frequent crying. Endocrine Present- Cold Intolerance and Heat Intolerance. Not Present- Excessive Hunger, Hair Changes, Hot flashes and New Diabetes. Hematology Present- Easy Bruising. Not Present- Blood Thinners, Excessive bleeding, Gland problems, HIV and Persistent Infections.    Physical Exam General Mental Status-Alert. General Appearance-Consistent with stated age. Posture-Normal posture. Voice-Normal.  Integumentary Note: no rash or lesion on limited exam   Head and Neck Head -Note: atraumatic, normocephalic.  Face Strength and Tone - facial muscle strength and tone is normal.  Eye Eyeball - Bilateral-Extraocular movements intact. Sclera/Conjunctiva - Bilateral-Normal.  Chest and Lung Exam Chest and lung exam reveals -quiet, even and easy respiratory effort with no use of accessory muscles.  Neuropsychiatric Mental status exam performed with findings of-able to articulate well with normal speech/language, rate, volume and coherence. The patient's mood and affect are described as -normal. Judgment and Insight-insight  is appropriate concerning matters relevant to self and the patient displays appropriate judgment regarding every day activities. Thought Processes/Cognitive Function-aware of current events.  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 Patient has been cleared from an active secreting adrenal mass. Patient will like to continue to proceed with surgery. 1. Will proceed to the operating room for a robotic left adrenalectomy. 2. I discussed with her the anatomy in the procedure in detail. I discussed with her the risks and benefits of the procedure to include but not limited to: Infection, bleeding, damage to structures, possible need for further surgery. The patient voiced understanding and wishes to proceed.

## 2019-03-31 NOTE — Progress Notes (Signed)
Michelle Park aware to arrive at 8:40Am 04/01/2019 nothing to eat or drink after midnight tonight.  May take Omeprazole and xanax if needed with a sip of water morning of surgery. She verbalized understanding.

## 2019-04-01 ENCOUNTER — Inpatient Hospital Stay (HOSPITAL_COMMUNITY): Payer: Medicare Other | Admitting: Physician Assistant

## 2019-04-01 ENCOUNTER — Encounter (HOSPITAL_COMMUNITY): Payer: Self-pay | Admitting: *Deleted

## 2019-04-01 ENCOUNTER — Encounter (HOSPITAL_COMMUNITY)
Admission: RE | Disposition: A | Discharge status: Home / Self Care | Payer: Self-pay | Source: Other Acute Inpatient Hospital | Attending: General Surgery

## 2019-04-01 ENCOUNTER — Other Ambulatory Visit: Payer: Self-pay

## 2019-04-01 ENCOUNTER — Inpatient Hospital Stay (HOSPITAL_COMMUNITY): Payer: Medicare Other | Admitting: Anesthesiology

## 2019-04-01 ENCOUNTER — Observation Stay (HOSPITAL_COMMUNITY)
Admission: RE | Admit: 2019-04-01 | Discharge: 2019-04-02 | Disposition: A | Discharge status: Home / Self Care | Payer: Medicare Other | Source: Other Acute Inpatient Hospital | Attending: General Surgery | Admitting: General Surgery

## 2019-04-01 DIAGNOSIS — F329 Major depressive disorder, single episode, unspecified: Secondary | ICD-10-CM | POA: Diagnosis not present

## 2019-04-01 DIAGNOSIS — E279 Disorder of adrenal gland, unspecified: Secondary | ICD-10-CM | POA: Diagnosis present

## 2019-04-01 DIAGNOSIS — F17200 Nicotine dependence, unspecified, uncomplicated: Secondary | ICD-10-CM | POA: Diagnosis not present

## 2019-04-01 DIAGNOSIS — G4730 Sleep apnea, unspecified: Secondary | ICD-10-CM | POA: Diagnosis not present

## 2019-04-01 DIAGNOSIS — Z6834 Body mass index (BMI) 34.0-34.9, adult: Secondary | ICD-10-CM | POA: Diagnosis not present

## 2019-04-01 DIAGNOSIS — K219 Gastro-esophageal reflux disease without esophagitis: Secondary | ICD-10-CM | POA: Insufficient documentation

## 2019-04-01 DIAGNOSIS — E669 Obesity, unspecified: Secondary | ICD-10-CM | POA: Insufficient documentation

## 2019-04-01 DIAGNOSIS — J449 Chronic obstructive pulmonary disease, unspecified: Secondary | ICD-10-CM | POA: Insufficient documentation

## 2019-04-01 DIAGNOSIS — D3502 Benign neoplasm of left adrenal gland: Secondary | ICD-10-CM | POA: Diagnosis not present

## 2019-04-01 DIAGNOSIS — E278 Other specified disorders of adrenal gland: Secondary | ICD-10-CM | POA: Diagnosis present

## 2019-04-01 DIAGNOSIS — E7800 Pure hypercholesterolemia, unspecified: Secondary | ICD-10-CM | POA: Diagnosis not present

## 2019-04-01 DIAGNOSIS — F419 Anxiety disorder, unspecified: Secondary | ICD-10-CM | POA: Diagnosis not present

## 2019-04-01 DIAGNOSIS — Z79899 Other long term (current) drug therapy: Secondary | ICD-10-CM | POA: Insufficient documentation

## 2019-04-01 DIAGNOSIS — I10 Essential (primary) hypertension: Secondary | ICD-10-CM | POA: Diagnosis not present

## 2019-04-01 DIAGNOSIS — R935 Abnormal findings on diagnostic imaging of other abdominal regions, including retroperitoneum: Secondary | ICD-10-CM

## 2019-04-01 HISTORY — PX: ROBOTIC ADRENALECTOMY: SHX6407

## 2019-04-01 LAB — TYPE AND SCREEN
ABO/RH(D): A POS
Antibody Screen: NEGATIVE

## 2019-04-01 LAB — ABO/RH: ABO/RH(D): A POS

## 2019-04-01 SURGERY — ADRENALECTOMY, ROBOT-ASSISTED
Anesthesia: General | Site: Abdomen | Laterality: Left

## 2019-04-01 MED ORDER — LIDOCAINE 2% (20 MG/ML) 5 ML SYRINGE
INTRAMUSCULAR | Status: AC
  Filled 2019-04-01: qty 5

## 2019-04-01 MED ORDER — ONDANSETRON HCL 4 MG/2ML IJ SOLN
INTRAMUSCULAR | Status: DC | PRN
  Administered 2019-04-01: 4 mg via INTRAVENOUS

## 2019-04-01 MED ORDER — BUPIVACAINE HCL (PF) 0.25 % IJ SOLN
INTRAMUSCULAR | Status: DC | PRN
  Administered 2019-04-01: 12 mL

## 2019-04-01 MED ORDER — CEFAZOLIN SODIUM-DEXTROSE 2-4 GM/100ML-% IV SOLN
2.0000 g | INTRAVENOUS | Status: AC
  Administered 2019-04-01: 2 g via INTRAVENOUS
  Filled 2019-04-01: qty 100

## 2019-04-01 MED ORDER — DEXAMETHASONE SODIUM PHOSPHATE 10 MG/ML IJ SOLN
INTRAMUSCULAR | Status: AC
  Filled 2019-04-01: qty 1

## 2019-04-01 MED ORDER — LACTATED RINGERS IR SOLN
Status: DC | PRN
  Administered 2019-04-01: 1000 mL

## 2019-04-01 MED ORDER — OXYCODONE HCL 5 MG/5ML PO SOLN: 5.0000 mg | Freq: Once | ORAL | Status: AC | PRN

## 2019-04-01 MED ORDER — FENOFIBRATE 160 MG PO TABS: 160.0000 mg | ORAL_TABLET | Freq: Every day | ORAL | Status: DC

## 2019-04-01 MED ORDER — PROPOFOL 10 MG/ML IV BOLUS
INTRAVENOUS | Status: DC | PRN
  Administered 2019-04-01: 160 mg via INTRAVENOUS

## 2019-04-01 MED ORDER — MEPERIDINE HCL 50 MG/ML IJ SOLN
6.2500 mg | Freq: Once | INTRAMUSCULAR | Status: AC
  Administered 2019-04-01: 6.25 mg via INTRAVENOUS

## 2019-04-01 MED ORDER — LACTATED RINGERS IV SOLN
INTRAVENOUS | Status: DC
  Administered 2019-04-01: 10:00:00 via INTRAVENOUS

## 2019-04-01 MED ORDER — ALPRAZOLAM 0.25 MG PO TABS: 0.2500 mg | ORAL_TABLET | Freq: Every day | ORAL | Status: DC | PRN

## 2019-04-01 MED ORDER — HYDROMORPHONE HCL 1 MG/ML IJ SOLN
1.0000 mg | INTRAMUSCULAR | Status: DC | PRN
  Administered 2019-04-01 – 2019-04-02 (×2): 1 mg via INTRAVENOUS
  Filled 2019-04-01: qty 1

## 2019-04-01 MED ORDER — DEXAMETHASONE SODIUM PHOSPHATE 10 MG/ML IJ SOLN
INTRAMUSCULAR | Status: DC | PRN
  Administered 2019-04-01: 10 mg via INTRAVENOUS

## 2019-04-01 MED ORDER — ROCURONIUM BROMIDE 10 MG/ML (PF) SYRINGE
PREFILLED_SYRINGE | INTRAVENOUS | Status: AC
  Filled 2019-04-01: qty 10

## 2019-04-01 MED ORDER — TRAMADOL HCL 50 MG PO TABS
50.0000 mg | ORAL_TABLET | Freq: Four times a day (QID) | ORAL | Status: DC | PRN
  Administered 2019-04-01 – 2019-04-02 (×2): 50 mg via ORAL
  Filled 2019-04-01 (×2): qty 1

## 2019-04-01 MED ORDER — ESCITALOPRAM OXALATE 20 MG PO TABS: 20.0000 mg | ORAL_TABLET | Freq: Every day | ORAL | Status: DC

## 2019-04-01 MED ORDER — LACTATED RINGERS IV SOLN
INTRAVENOUS | Status: DC
  Administered 2019-04-01 – 2019-04-02 (×3): via INTRAVENOUS

## 2019-04-01 MED ORDER — ROCURONIUM BROMIDE 10 MG/ML (PF) SYRINGE
PREFILLED_SYRINGE | INTRAVENOUS | Status: DC | PRN
  Administered 2019-04-01: 20 mg via INTRAVENOUS
  Administered 2019-04-01: 60 mg via INTRAVENOUS

## 2019-04-01 MED ORDER — 0.9 % SODIUM CHLORIDE (POUR BTL) OPTIME
TOPICAL | Status: DC | PRN
  Administered 2019-04-01: 1000 mL

## 2019-04-01 MED ORDER — FENTANYL CITRATE (PF) 250 MCG/5ML IJ SOLN
INTRAMUSCULAR | Status: DC | PRN
  Administered 2019-04-01 (×3): 50 ug via INTRAVENOUS

## 2019-04-01 MED ORDER — FENTANYL CITRATE (PF) 100 MCG/2ML IJ SOLN
25.0000 ug | INTRAMUSCULAR | Status: DC | PRN
  Administered 2019-04-01 (×3): 50 ug via INTRAVENOUS

## 2019-04-01 MED ORDER — LIDOCAINE 2% (20 MG/ML) 5 ML SYRINGE
INTRAMUSCULAR | Status: DC | PRN
  Administered 2019-04-01: 60 mg via INTRAVENOUS

## 2019-04-01 MED ORDER — ALPRAZOLAM 0.5 MG PO TABS
0.5000 mg | ORAL_TABLET | Freq: Every day | ORAL | Status: DC
  Administered 2019-04-01: 0.5 mg via ORAL
  Filled 2019-04-01: qty 1

## 2019-04-01 MED ORDER — KETAMINE HCL 10 MG/ML IJ SOLN
INTRAMUSCULAR | Status: DC | PRN
  Administered 2019-04-01: 50 mg via INTRAVENOUS

## 2019-04-01 MED ORDER — FLUTICASONE PROPIONATE 50 MCG/ACT NA SUSP: 1.0000 | Freq: Every day | NASAL | Status: DC | PRN

## 2019-04-01 MED ORDER — LOSARTAN POTASSIUM 50 MG PO TABS
100.0000 mg | ORAL_TABLET | Freq: Every day | ORAL | Status: DC
  Administered 2019-04-02: 09:00:00 100 mg via ORAL
  Filled 2019-04-01 (×2): qty 2

## 2019-04-01 MED ORDER — BUPIVACAINE HCL (PF) 0.25 % IJ SOLN
INTRAMUSCULAR | Status: AC
  Filled 2019-04-01: qty 30

## 2019-04-01 MED ORDER — GABAPENTIN 300 MG PO CAPS
300.0000 mg | ORAL_CAPSULE | ORAL | Status: AC
  Administered 2019-04-01: 10:00:00 300 mg via ORAL
  Filled 2019-04-01: qty 1

## 2019-04-01 MED ORDER — MEPERIDINE HCL 50 MG/ML IJ SOLN
INTRAMUSCULAR | Status: AC
  Filled 2019-04-01: qty 1

## 2019-04-01 MED ORDER — LIDOCAINE HCL 2 % IJ SOLN
INTRAMUSCULAR | Status: AC
  Filled 2019-04-01: qty 20

## 2019-04-01 MED ORDER — ONDANSETRON HCL 4 MG/2ML IJ SOLN
INTRAMUSCULAR | Status: AC
  Filled 2019-04-01: qty 2

## 2019-04-01 MED ORDER — MAGNESIUM 500 MG PO TABS: 500.0000 mg | ORAL_TABLET | Freq: Every day | ORAL | Status: DC

## 2019-04-01 MED ORDER — LIDOCAINE 2% (20 MG/ML) 5 ML SYRINGE
INTRAMUSCULAR | Status: DC | PRN
  Administered 2019-04-01: 1.5 mg/kg/h via INTRAVENOUS

## 2019-04-01 MED ORDER — FENTANYL CITRATE (PF) 100 MCG/2ML IJ SOLN
INTRAMUSCULAR | Status: AC
  Filled 2019-04-01: qty 4

## 2019-04-01 MED ORDER — SUGAMMADEX SODIUM 200 MG/2ML IV SOLN
INTRAVENOUS | Status: DC | PRN
  Administered 2019-04-01: 200 mg via INTRAVENOUS

## 2019-04-01 MED ORDER — SODIUM CHLORIDE 0.9 % IV SOLN
500.0000 mL | Freq: Once | INTRAVENOUS | Status: AC
  Administered 2019-04-01: 500 mL via INTRAVENOUS

## 2019-04-01 MED ORDER — CHLORHEXIDINE GLUCONATE CLOTH 2 % EX PADS: 6.0000 | MEDICATED_PAD | Freq: Once | CUTANEOUS | Status: DC

## 2019-04-01 MED ORDER — ONDANSETRON HCL 4 MG/2ML IJ SOLN: 4.0000 mg | Freq: Once | INTRAMUSCULAR | Status: DC | PRN

## 2019-04-01 MED ORDER — PANTOPRAZOLE SODIUM 40 MG PO TBEC
40.0000 mg | DELAYED_RELEASE_TABLET | Freq: Every day | ORAL | Status: DC
  Administered 2019-04-02: 40 mg via ORAL
  Filled 2019-04-01: qty 1

## 2019-04-01 MED ORDER — ACETAMINOPHEN 500 MG PO TABS
1000.0000 mg | ORAL_TABLET | ORAL | Status: AC
  Administered 2019-04-01: 1000 mg via ORAL
  Filled 2019-04-01: qty 2

## 2019-04-01 MED ORDER — OXYCODONE HCL 5 MG PO TABS
ORAL_TABLET | ORAL | Status: AC
  Filled 2019-04-01: qty 1

## 2019-04-01 MED ORDER — SODIUM CHLORIDE 0.9 % IV SOLN
INTRAVENOUS | Status: DC | PRN
  Administered 2019-04-01: 20 ug/min via INTRAVENOUS

## 2019-04-01 MED ORDER — MAGNESIUM OXIDE 400 (241.3 MG) MG PO TABS
400.0000 mg | ORAL_TABLET | Freq: Every day | ORAL | Status: DC
  Administered 2019-04-01: 400 mg via ORAL
  Filled 2019-04-01: qty 1

## 2019-04-01 MED ORDER — MIDAZOLAM HCL 2 MG/2ML IJ SOLN
INTRAMUSCULAR | Status: AC
  Filled 2019-04-01: qty 2

## 2019-04-01 MED ORDER — OXYCODONE HCL 5 MG PO TABS
5.0000 mg | ORAL_TABLET | Freq: Once | ORAL | Status: AC | PRN
  Administered 2019-04-01: 15:00:00 5 mg via ORAL

## 2019-04-01 MED ORDER — MIDAZOLAM HCL 2 MG/2ML IJ SOLN
INTRAMUSCULAR | Status: DC | PRN
  Administered 2019-04-01: 2 mg via INTRAVENOUS

## 2019-04-01 MED ORDER — CELECOXIB 200 MG PO CAPS
200.0000 mg | ORAL_CAPSULE | ORAL | Status: AC
  Administered 2019-04-01: 200 mg via ORAL
  Filled 2019-04-01: qty 1

## 2019-04-01 MED ORDER — AMOXICILLIN 500 MG PO CAPS: 500.0000 mg | ORAL_CAPSULE | Freq: Four times a day (QID) | ORAL | Status: DC

## 2019-04-01 MED ORDER — PROPOFOL 10 MG/ML IV BOLUS
INTRAVENOUS | Status: AC
  Filled 2019-04-01: qty 20

## 2019-04-01 MED ORDER — FENTANYL CITRATE (PF) 250 MCG/5ML IJ SOLN
INTRAMUSCULAR | Status: AC
  Filled 2019-04-01: qty 5

## 2019-04-01 MED ORDER — PHENYLEPHRINE HCL-NACL 10-0.9 MG/250ML-% IV SOLN
INTRAVENOUS | Status: AC
  Filled 2019-04-01: qty 250

## 2019-04-01 MED ORDER — ADULT MULTIVITAMIN W/MINERALS CH: 1.0000 | ORAL_TABLET | Freq: Every day | ORAL | Status: DC

## 2019-04-01 MED ORDER — CALCIUM CARBONATE-VITAMIN D 500-200 MG-UNIT PO TABS
1.0000 | ORAL_TABLET | Freq: Three times a day (TID) | ORAL | Status: DC
  Administered 2019-04-02: 1 via ORAL
  Filled 2019-04-01: qty 1

## 2019-04-01 MED ORDER — HYDROMORPHONE HCL 1 MG/ML IJ SOLN
INTRAMUSCULAR | Status: AC
  Filled 2019-04-01: qty 1

## 2019-04-01 SURGICAL SUPPLY — 69 items
ADH SKN CLS APL DERMABOND .7 (GAUZE/BANDAGES/DRESSINGS) ×1
APL PRP STRL LF DISP 70% ISPRP (MISCELLANEOUS) ×1
APPLIER CLIP 5 13 M/L LIGAMAX5 (MISCELLANEOUS)
APR CLP MED LRG 5 ANG JAW (MISCELLANEOUS)
BAG SPEC RTRVL 10 TROC 200 (ENDOMECHANICALS) ×1
BLADE SURG SZ11 CARB STEEL (BLADE) ×3 IMPLANT
CHLORAPREP W/TINT 26 (MISCELLANEOUS) ×3 IMPLANT
CLIP APPLIE 5 13 M/L LIGAMAX5 (MISCELLANEOUS) IMPLANT
CLIP VESOLOCK LG 6/CT PURPLE (CLIP) IMPLANT
CLIP VESOLOCK MED LG 6/CT (CLIP) ×4 IMPLANT
CONNECTOR 5 IN 1 STRAIGHT STRL (MISCELLANEOUS) IMPLANT
COVER SURGICAL LIGHT HANDLE (MISCELLANEOUS) ×3 IMPLANT
COVER TIP SHEARS 8 DVNC (MISCELLANEOUS) ×1 IMPLANT
COVER TIP SHEARS 8MM DA VINCI (MISCELLANEOUS) ×2
COVER WAND RF STERILE (DRAPES) IMPLANT
DECANTER SPIKE VIAL GLASS SM (MISCELLANEOUS) ×1 IMPLANT
DERMABOND ADVANCED (GAUZE/BANDAGES/DRESSINGS) ×2
DERMABOND ADVANCED .7 DNX12 (GAUZE/BANDAGES/DRESSINGS) ×1 IMPLANT
DEVICE TROCAR PUNCTURE CLOSURE (ENDOMECHANICALS) ×2 IMPLANT
DRAIN CHANNEL 19F RND (DRAIN) IMPLANT
DRAPE ARM DVNC X/XI (DISPOSABLE) ×4 IMPLANT
DRAPE COLUMN DVNC XI (DISPOSABLE) ×1 IMPLANT
DRAPE DA VINCI XI ARM (DISPOSABLE) ×8
DRAPE DA VINCI XI COLUMN (DISPOSABLE) ×2
DRAPE SHEET LG 3/4 BI-LAMINATE (DRAPES) IMPLANT
DRSG TEGADERM 6X8 (GAUZE/BANDAGES/DRESSINGS) ×3 IMPLANT
ELECT REM PT RETURN 15FT ADLT (MISCELLANEOUS) ×3 IMPLANT
GAUZE SPONGE 2X2 8PLY STRL LF (GAUZE/BANDAGES/DRESSINGS) ×1 IMPLANT
GAUZE SPONGE 4X4 12PLY STRL (GAUZE/BANDAGES/DRESSINGS) IMPLANT
GLOVE BIO SURGEON STRL SZ 6.5 (GLOVE) ×1 IMPLANT
GLOVE BIO SURGEON STRL SZ7.5 (GLOVE) ×6 IMPLANT
GLOVE BIO SURGEONS STRL SZ 6.5 (GLOVE) ×1
GLOVE BIOGEL PI IND STRL 6.5 (GLOVE) IMPLANT
GLOVE BIOGEL PI INDICATOR 6.5 (GLOVE) ×2
GLOVE ECLIPSE 8.0 STRL XLNG CF (GLOVE) ×2 IMPLANT
GLOVE INDICATOR 8.0 STRL GRN (GLOVE) ×2 IMPLANT
GLOVE SURG SS PI 7.0 STRL IVOR (GLOVE) ×2 IMPLANT
GOWN STRL REUS W/TWL XL LVL3 (GOWN DISPOSABLE) ×11 IMPLANT
KIT BASIN OR (CUSTOM PROCEDURE TRAY) ×3 IMPLANT
KIT TURNOVER KIT A (KITS) IMPLANT
NDL INSUFFLATION 14GA 120MM (NEEDLE) ×1 IMPLANT
NEEDLE HYPO 22GX1.5 SAFETY (NEEDLE) ×3 IMPLANT
NEEDLE INSUFFLATION 14GA 120MM (NEEDLE) ×3 IMPLANT
PACK GENERAL/GYN (CUSTOM PROCEDURE TRAY) ×2 IMPLANT
POUCH RETRIEVAL ECOSAC 10 (ENDOMECHANICALS) ×1 IMPLANT
POUCH RETRIEVAL ECOSAC 10MM (ENDOMECHANICALS) ×2
PROTECTOR NERVE ULNAR (MISCELLANEOUS) ×3 IMPLANT
SCISSORS LAP 5X35 DISP (ENDOMECHANICALS) ×3 IMPLANT
SEAL CANN UNIV 5-8 DVNC XI (MISCELLANEOUS) ×4 IMPLANT
SEAL XI 5MM-8MM UNIVERSAL (MISCELLANEOUS) ×8
SEALER VESSEL DA VINCI XI (MISCELLANEOUS) ×2
SEALER VESSEL EXT DVNC XI (MISCELLANEOUS) IMPLANT
SET IRRIG TUBING LAPAROSCOPIC (IRRIGATION / IRRIGATOR) ×3 IMPLANT
SET TUBE SMOKE EVAC HIGH FLOW (TUBING) ×3 IMPLANT
SOLUTION ELECTROLUBE (MISCELLANEOUS) ×3 IMPLANT
SPONGE GAUZE 2X2 STER 10/PKG (GAUZE/BANDAGES/DRESSINGS) ×2
SPONGE LAP 18X18 RF (DISPOSABLE) ×1 IMPLANT
SUT MNCRL AB 4-0 PS2 18 (SUTURE) ×3 IMPLANT
SUT PDS AB 1 CTX 36 (SUTURE) IMPLANT
SUT VICRYL 0 TIES 12 18 (SUTURE) ×2 IMPLANT
SYR 10ML ECCENTRIC (SYRINGE) ×3 IMPLANT
SYR 20ML LL LF (SYRINGE) ×3 IMPLANT
TAPE CLOTH 4X10 WHT NS (GAUZE/BANDAGES/DRESSINGS) ×3 IMPLANT
TOWEL OR 17X26 10 PK STRL BLUE (TOWEL DISPOSABLE) ×3 IMPLANT
TOWEL OR NON WOVEN STRL DISP B (DISPOSABLE) ×3 IMPLANT
TRAY FOLEY MTR SLVR 14FR STAT (SET/KITS/TRAYS/PACK) ×2 IMPLANT
TROCAR ADV FIXATION 5X100MM (TROCAR) ×3 IMPLANT
TUBING CONNECTING 10 (TUBING) IMPLANT
TUBING CONNECTING 10' (TUBING)

## 2019-04-01 NOTE — Interval H&P Note (Signed)
History and Physical Interval Note:  04/01/2019 10:00 AM  Michelle Park  has presented today for surgery, with the diagnosis of LEFT ADRENAL MASS.  The various methods of treatment have been discussed with the patient and family. After consideration of risks, benefits and other options for treatment, the patient has consented to  Procedure(s): XI ROBOTIC LEFT ADRENALECTOMY (Left) as a surgical intervention.  The patient's history has been reviewed, patient examined, no change in status, stable for surgery.  I have reviewed the patient's chart and labs.  Questions were answered to the patient's satisfaction.     Ralene Ok

## 2019-04-01 NOTE — Anesthesia Procedure Notes (Addendum)
Procedure Name: Intubation Date/Time: 04/01/2019 10:48 AM Performed by: Sharlette Dense, CRNA Patient Re-evaluated:Patient Re-evaluated prior to induction Oxygen Delivery Method: Circle system utilized Preoxygenation: Pre-oxygenation with 100% oxygen Induction Type: IV induction Ventilation: Mask ventilation without difficulty and Oral airway inserted - appropriate to patient size Laryngoscope Size: Sabra Heck and 2 Grade View: Grade I Tube type: Oral Tube size: 7.5 mm Number of attempts: 1 Airway Equipment and Method: Stylet Placement Confirmation: ETT inserted through vocal cords under direct vision,  positive ETCO2 and breath sounds checked- equal and bilateral Secured at: 21 cm Tube secured with: Tape Dental Injury: Teeth and Oropharynx as per pre-operative assessment

## 2019-04-01 NOTE — Op Note (Signed)
04/01/2019  12:42 PM  PATIENT:  Michelle Park  68 y.o. female  PRE-OPERATIVE DIAGNOSIS:  LEFT ADRENAL MASS  POST-OPERATIVE DIAGNOSIS:  LEFT ADRENAL MASS  PROCEDURE:  Procedure(s): XI ROBOTIC LEFT ADRENALECTOMY (Left)  SURGEON:  Surgeon(s) and Role:    Ralene Ok, MD - Primary    Michael Boston, MD - Assisting  ANESTHESIA:   local and general  EBL:  50 mL   BLOOD ADMINISTERED:none  DRAINS: none   LOCAL MEDICATIONS USED:  BUPIVICAINE   SPECIMEN:  Source of Specimen:  Left adrenal gland  DISPOSITION OF SPECIMEN:  PATHOLOGY  COUNTS:  YES  TOURNIQUET:  * No tourniquets in log *  DICTATION: .Dragon Dictation Indication procedure: Patient is a 68 year old female who is referred by Ellin Goodie, M.D. Patient has a chief complaint of a left adrenal mass Patient has a history of anxiety, hypercholesterolemia, hypertension, who comes in with a left-sided adrenal mass. Patient was found to have this left-sided renal mass after having nausea proceeding to the ER. Patient underwent CT scan which revealed a 5.4 x 1.9 x 2.9 cm adrenal mass.  Patient saw Dr. Letta Median of endocrinology and had an active adrenal ruled out.  Findings: Patient had a left adrenal mass.  This came out in its entirety.  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  The left adrenal vein was clipped 2 proximally 1 distally and transected.  The left adrenal vein from the adrenal artery was clipped with 2 clips proximally 1 distally.  All other arteries were sealed with the vessel sealer.  Details of the procedure: After the patient was consented the patient was taken back to the OR and placed in the supine position with bilateral SCDs in place.  Patient was then placed under general endotracheal anesthesia. A Foley catheter was placed.  At this time the patient was placed in the left decubitus position.  Patient was then prepped and draped sterile fashion.  Timeout is called all facts  verified.  A Veress needle technique was used to insufflate the abdomen to 15 mmHg in the left subcostal margin.  Subsequent to this an 8 mm trocar was placed.  The camera was then placed and there was no injury to any intra-abdominal organs.  At this time for 8 mm trochars were then placed in the left subcostal margin.  This was done under direct visualization.  A 5 mm assistant port was then placed in the midclavicular line at near the umbilicus.  At this time the robot patient cart was brought to the patient side and all 8 mm ports were docked to the robot arms.  At this time the white line of Toldt was incised.  The left colon was then reflected medially and inferiorly.  The retroperitoneal attachments of the mesentery were bluntly dissected away.  The leinorenal in the splenocolic ligaments were dissected and transected with the vessel sealer.  The inferior border of the pancreas could be visualized.  This was reflected medially.  At this time is apparent that there was a bilobar spleen.  This apparently posterior to the pancreas.  This was left in place.  At this time George's fascia was incised.  This was done at the superior portion of the kidney.  This dissection was taken medially.  At this time the left adrenal could be seen.  At this time we proceeded with a medial to lateral dissection until a left adrenal vein was visualized.  2 clips were placed proximally  one distally and the vein was transected.  The left adrenal artery from the left renal vein was visualized and 2 clips placed proximally one distally.  This was transected.  At this time the adrenal gland was dissected away and the inferior border and medial border.  The vessel sealer was used to help maintain hemostasis.  At this time the dissection continued on the along the lateral border, and medial border of the spleen.  This was done both bluntly and with the vessel sealer.  The dissection continued superiorly.  The vascular assisted  with hemostasis and dissection.  This enabled [place] to deliver the left adrenal.  At this time a eco-sac was placed into the abdomen.  The adrenal gland was placed into the bag.  The area was checked for hemostasis and irrigated.  Hemostasis was excellent.  At this time the robot was undocked.  The eco-sac and the adrenal gland were removed in its entirety.  At this time 0 Vicryl's were used to reapproximate the 8 mm port site that the specimen was removed from.  This was done x3.  At this time insufflation was evacuated.  The skin was reapproximated using 4-0 Monocryl subcuticular fashion.  The skin was dressed with Dermabond.  The patient tired procedure well was taken to recovery in stable condition.    PLAN OF CARE: Admit for overnight observation  PATIENT DISPOSITION:  PACU - hemodynamically stable.   Delay start of Pharmacological VTE agent (>24hrs) due to surgical blood loss or risk of bleeding: yes

## 2019-04-01 NOTE — Anesthesia Preprocedure Evaluation (Addendum)
Anesthesia Evaluation  Patient identified by MRN, date of birth, ID band Patient awake    Reviewed: Allergy & Precautions, NPO status , Patient's Chart, lab work & pertinent test results  History of Anesthesia Complications Negative for: history of anesthetic complications  Airway Mallampati: II  TM Distance: >3 FB Neck ROM: Full    Dental  (+) Dental Advisory Given, Teeth Intact   Pulmonary COPD, Current Smoker and Patient abstained from smoking.,    breath sounds clear to auscultation       Cardiovascular hypertension, Pt. on medications (-) angina+ Valvular Problems/Murmurs  Rhythm:Regular Rate:Normal + Systolic murmurs    Neuro/Psych  Headaches, PSYCHIATRIC DISORDERS Anxiety Depression  RLS     GI/Hepatic Neg liver ROS, GERD  Medicated and Controlled,  Endo/Other   Obesity   Renal/GU negative Renal ROS     Musculoskeletal  (+) Fibromyalgia -  Abdominal   Peds  Hematology negative hematology ROS (+)   Anesthesia Other Findings Left adrenal mass - non-secreting per surgeon's note   Reproductive/Obstetrics                           Anesthesia Physical Anesthesia Plan  ASA: III  Anesthesia Plan: General   Post-op Pain Management:    Induction: Intravenous  PONV Risk Score and Plan: 4 or greater and Treatment may vary due to age or medical condition, Ondansetron, Dexamethasone and Midazolam  Airway Management Planned: Oral ETT  Additional Equipment: None  Intra-op Plan:   Post-operative Plan: Extubation in OR  Informed Consent: I have reviewed the patients History and Physical, chart, labs and discussed the procedure including the risks, benefits and alternatives for the proposed anesthesia with the patient or authorized representative who has indicated his/her understanding and acceptance.     Dental advisory given  Plan Discussed with: CRNA and  Anesthesiologist  Anesthesia Plan Comments:        Anesthesia Quick Evaluation

## 2019-04-01 NOTE — Discharge Instructions (Signed)
Adrenalectomy, Care After This sheet gives you information about how to care for yourself after your procedure. Your health care provider may also give you more specific instructions. If you have problems or questions, contact your health care provider. What can I expect after the procedure? After the procedure, it is common to have:  A sore throat. This may occur if you had a breathing tube inserted down your throat.  Soreness and mild pain in the abdomen where the incisions were made. Follow these instructions at home: Incision care      Follow instructions from your health care provider about how to take care of your incision(s). Make sure you: ? Wash your hands with soap and water before and after you change your bandage (dressing). If soap and water are not available, use hand sanitizer. ? Change your dressing as told by your health care provider. ? Leave stitches (sutures), skin glue, or adhesive strips in place. These skin closures may need to stay in place for 2 weeks or longer. If adhesive strip edges start to loosen and curl up, you may trim the loose edges. Do not remove adhesive strips completely unless your health care provider tells you to do that.  Check your incision area(s) every day for signs of infection. Check for: ? Redness, swelling, or increasing pain. ? Fluid or blood. ? Warmth. ? Pus or a bad smell.  Do not take baths, swim, or use a hot tub until your health care provider approves. Ask your health care provider if you may take showers. You may only be allowed to take sponge baths. Medicines  Take over-the-counter and prescription medicines only as told by your health care provider.  Do not drive or use heavy machinery while taking prescription pain medicine or medicine for nausea.  Ask your health care provider about the need for new medicine or changes in existing medicines if the removed adrenal gland was making too much of a hormone, usually cortisol,  aldosterone, or adrenaline.  If both of your adrenal glands were removed, you may need to: ? Take certain hormone replacement medicines for life. ? Wear a medical alert bracelet that states your adrenal glands have been removed. ? Ask your health care provider what to do if you get sick and are not able to take your medicines. This is called a sick day plan. Activity  Do not lift anything that is heavier than 10 lb (4.5 kg), or the limit that you are told, until your health care provider says that it is safe.  Return to your normal activities as told by your health care provider. Ask your health care provider what activities are safe for you. General instructions   Follow the diet that your health care provider prescribed for you.  Wear compression stockings as told by your health care provider. These stockings help to prevent blood clots and reduce swelling in your legs.  Do not use any products that contain nicotine or tobacco, such as cigarettes, e-cigarettes, and chewing tobacco. These can delay healing after surgery. If you need help quitting, ask your health care provider.  Weigh yourself and take your blood pressure every day if told by your health care provider. Your health care provider will tell you what results should be reported right away.  Keep all follow-up visits as told by your health care provider. This is important. Contact a health care provider if:  You have redness, swelling, or increasing pain around an incision.  You have fluid  or blood coming from an incision.  Your incision feels warm to the touch.  You have pus or a bad smell coming from an incision or a dressing.  You have a fever or chills.  Your pain does not improve with the pain medicines you have been given.  You cannot take your medicines for any reason.  You feel nauseous or you vomit.  You are constipated or you have diarrhea.  You develop a rash.  You develop a new cough.  Your blood  pressure is too high or too low.  You have problems with your urine, such as: ? Pain or burning with urination. ? A need to urinate more often than usual. ? Blood in your urine. Get help right away if:  You have chest pain.  You have shortness of breath.  You suddenly feel too weak or dizzy to stand or walk.  You have a severe increase in pain.  You have pain, tenderness, or redness in your calf.  You have an incision that opens up. The dressing on an incision may become soaked with blood. Summary  After an adrenalectomy, it is common to have a sore throat and mild pain in the abdomen.  Follow your health care provider's instructions for caring for your incision(s).  You may be given medicines to treat pain and to regulate certain hormone levels. Take them exactly as told.  Contact a health care provider if you have nausea, rash, fever or chills, or other signs of infection. Also, report if you have problems with urination or are constipated or have diarrhea.  Get help right away if you have chest pain, shortness of breath, pain or tenderness in your calf, or an incision that opens up. This information is not intended to replace advice given to you by your health care provider. Make sure you discuss any questions you have with your health care provider. Document Released: 11/05/2010 Document Revised: 04/13/2018 Document Reviewed: 04/13/2018 Elsevier Patient Education  2020 [employer].

## 2019-04-01 NOTE — Transfer of Care (Signed)
Immediate Anesthesia Transfer of Care Note  Patient: Michelle Park  Procedure(s) Performed: XI ROBOTIC LEFT ADRENALECTOMY (Left Abdomen)  Patient Location: PACU  Anesthesia Type:General  Level of Consciousness: drowsy  Airway & Oxygen Therapy: Patient Spontanous Breathing and Patient connected to face mask oxygen  Post-op Assessment: Report given to RN and Post -op Vital signs reviewed and stable  Post vital signs: Reviewed and stable  Last Vitals:  Vitals Value Taken Time  BP 158/81 04/01/19 1301  Temp    Pulse 86 04/01/19 1303  Resp 16 04/01/19 1303  SpO2 100 % 04/01/19 1303  Vitals shown include unvalidated device data.  Last Pain:  Vitals:   04/01/19 0930  TempSrc:   PainSc: 0-No pain         Complications: No apparent anesthesia complications

## 2019-04-01 NOTE — Anesthesia Postprocedure Evaluation (Signed)
Anesthesia Post Note  Patient: Michelle Park  Procedure(s) Performed: XI ROBOTIC LEFT ADRENALECTOMY (Left Abdomen)     Patient location during evaluation: PACU Anesthesia Type: General Level of consciousness: awake and alert Pain management: pain level controlled Vital Signs Assessment: post-procedure vital signs reviewed and stable Respiratory status: spontaneous breathing, nonlabored ventilation, respiratory function stable and patient connected to nasal cannula oxygen Cardiovascular status: blood pressure returned to baseline and stable Postop Assessment: no apparent nausea or vomiting Anesthetic complications: no    Last Vitals:  Vitals:   04/01/19 1345 04/01/19 1400  BP: 109/66 99/65  Pulse: 71 66  Resp: 18 14  Temp:    SpO2: 97% 100%    Last Pain:  Vitals:   04/01/19 1400  TempSrc:   PainSc: [place]

## 2019-04-02 ENCOUNTER — Encounter (HOSPITAL_COMMUNITY): Payer: Self-pay | Admitting: General Surgery

## 2019-04-02 DIAGNOSIS — D3502 Benign neoplasm of left adrenal gland: Secondary | ICD-10-CM | POA: Diagnosis not present

## 2019-04-02 LAB — BASIC METABOLIC PANEL
Anion gap: 7 (ref 5–15)
BUN: 14 mg/dL (ref 8–23)
CO2: 25 mmol/L (ref 22–32)
Calcium: 9 mg/dL (ref 8.9–10.3)
Chloride: 99 mmol/L (ref 98–111)
Creatinine, Ser: 0.83 mg/dL (ref 0.44–1.00)
GFR calc Af Amer: >60 mL/min (ref >60)
GFR calc non Af Amer: >60 mL/min (ref >60)
Glucose, Bld: 95 mg/dL (ref 70–99)
Potassium: 3.9 mmol/L (ref 3.5–5.1)
Sodium: 131 mmol/L — ABNORMAL LOW (ref 135–145)

## 2019-04-02 LAB — CBC
HCT: 33.6 % — ABNORMAL LOW (ref 36.0–46.0)
Hemoglobin: 10.5 g/dL — ABNORMAL LOW (ref 12.0–15.0)
MCH: 28.5 pg (ref 26.0–34.0)
MCHC: 31.3 g/dL (ref 30.0–36.0)
MCV: 91.3 fL (ref 80.0–100.0)
Platelets: 330 10*3/uL (ref 150–400)
RBC: 3.68 MIL/uL — ABNORMAL LOW (ref 3.87–5.11)
RDW: 14.6 % (ref 11.5–15.5)
WBC: 12.2 10*3/uL — ABNORMAL HIGH (ref 4.0–10.5)
nRBC: 0 % (ref 0.0–0.2)

## 2019-04-02 MED ORDER — DEXTROSE-NACL 5-0.9 % IV SOLN: INTRAVENOUS | Status: DC

## 2019-04-02 MED ORDER — ONDANSETRON HCL 4 MG/2ML IJ SOLN: 4.0000 mg | Freq: Four times a day (QID) | INTRAMUSCULAR | Status: DC | PRN

## 2019-04-02 MED ORDER — HYDRALAZINE HCL 20 MG/ML IJ SOLN: 10.0000 mg | INTRAMUSCULAR | Status: DC | PRN

## 2019-04-02 MED ORDER — ONDANSETRON 4 MG PO TBDP: 4.0000 mg | ORAL_TABLET | Freq: Four times a day (QID) | ORAL | Status: DC | PRN

## 2019-04-02 MED ORDER — TRAMADOL HCL 50 MG PO TABS: 50.0000 mg | ORAL_TABLET | Freq: Four times a day (QID) | ORAL | 0 refills | Status: DC | PRN

## 2019-04-02 NOTE — Progress Notes (Signed)
Nurse reviewed discharge instructions with pt. Pt verbalized understanding of discharge instructions, follow up appointment and new medications.  No concerns at time of discharge. 

## 2019-04-02 NOTE — Discharge Summary (Signed)
Patient ID: CHRISTINEA CLINEBELL MRN: [id number] DOB/AGE: [birth year] 68 y.o.  Admit date: 04/01/2019 Discharge date: 04/02/2019  Discharge Diagnoses Patient Active Problem List   Diagnosis Date Noted  . 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 Left adrenal mass ([place]) 11/01/2018  . Abnormal CT of the abdomen 08/22/2018  . Nausea 08/22/2018  . Unintentional weight loss 08/22/2018  . Aortic sclerosis 12/17/2015  . Fibromyalgia 10/11/2015  . Left hand pain 08/01/2015  . Preventative health care 03/21/2015  . Chronic back pain   . GERD (gastroesophageal reflux disease)   . Hyperlipidemia   . Hypertension   . Episodic mood disorder ([place])   . 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 SMOKER 05/09/2009  . COPD (chronic obstructive pulmonary disease) ([place]) 09/14/2008    Consultants None  Procedures Robotic left adrenalectomy 04/01/2019  Hospital Course: She was admitted postoperatively where she recovered well. On 04/02/2019, she was ambulating independently, tolerating a diet, pain was controlled with oral analgesics, vitals stable, and exam benign. She was deemed stable for discharge home.  Her surgery and findings intraoperatively were reviewed with her today. She has been ambulating and eating without n/v. Pain well controlled. Denies any complaints  AF VSS NAD, comfortable, finishing breakfast RRR Abdomen is obese, soft, minimally tender along port sites, nondistended; incisions c/d/i without erythema    Allergies as of 04/02/2019      Reactions   Aspirin Other (See Comments)   Indigestion/throat burns   Atorvastatin    REACTION: causes leg pain   Rosuvastatin Other (See Comments)   Myalgias      Medication List    STOP taking these medications   dexamethasone 1 MG tablet Commonly known as: DECADRON     TAKE these medications   ALPRAZolam 0.5 MG tablet Commonly known as: XANAX Take 1 tablet (0.5 mg total) by mouth 2 (two) times daily as needed. What changed:   how much to  take  when to take this  additional instructions   amoxicillin 500 MG capsule Commonly known as: AMOXIL Take 500 mg by mouth QID. Tooth extraction  Done 03-23-19   calcium-vitamin D 500-200 MG-UNIT tablet Commonly known as: OSCAL WITH D Take 1 tablet by mouth 3 (three) times daily with meals. 1500 mg per day for low bone densit   escitalopram 20 MG tablet Commonly known as: LEXAPRO Take 20 mg by mouth daily after lunch.   fenofibrate 160 MG tablet Take 160 mg by mouth daily after supper.   fluticasone 50 MCG/ACT nasal spray Commonly known as: FLONASE Place 1 spray into both nostrils daily as needed for allergies or rhinitis.   losartan 100 MG tablet Commonly known as: COZAAR Take 100 mg by mouth daily.   Magnesium 500 MG Tabs Take 500 mg by mouth daily after supper.   multivitamin with minerals Tabs tablet Take 1 tablet by mouth daily after lunch. Centrum   omeprazole 40 MG capsule Commonly known as: PRILOSEC Take 1 capsule (40 mg total) by mouth 2 (two) times daily. What changed: when to take this   traMADol 50 MG tablet Commonly known as: ULTRAM Take 1 tablet (50 mg total) by mouth every 6 (six) hours as needed (postop pain not controlled with tylenol/ibuprofen).        Follow-up Information    Ralene Ok, MD. Schedule an appointment as soon as possible for a visit in 2 weeks.   Specialty: General Surgery Why: Post op visit Contact information: [address] [place] [address]  [place] Dema Severin, M.D. [facility]

## 2019-05-17 ENCOUNTER — Other Ambulatory Visit: Payer: Self-pay

## 2019-05-17 DIAGNOSIS — Z20822 Contact with and (suspected) exposure to covid-19: Secondary | ICD-10-CM

## 2019-05-19 LAB — NOVEL CORONAVIRUS, NAA: SARS-CoV-2, NAA: NOT DETECTED

## 2019-10-19 IMAGING — DX DG CHEST 2V
3 series · 3 of 3 positions shown · non-contrast
Comparison: Chest x-ray dated August 27, 2016.

CLINICAL DATA: Nausea.

EXAM:
CHEST - 2 VIEW

[x chest ap]
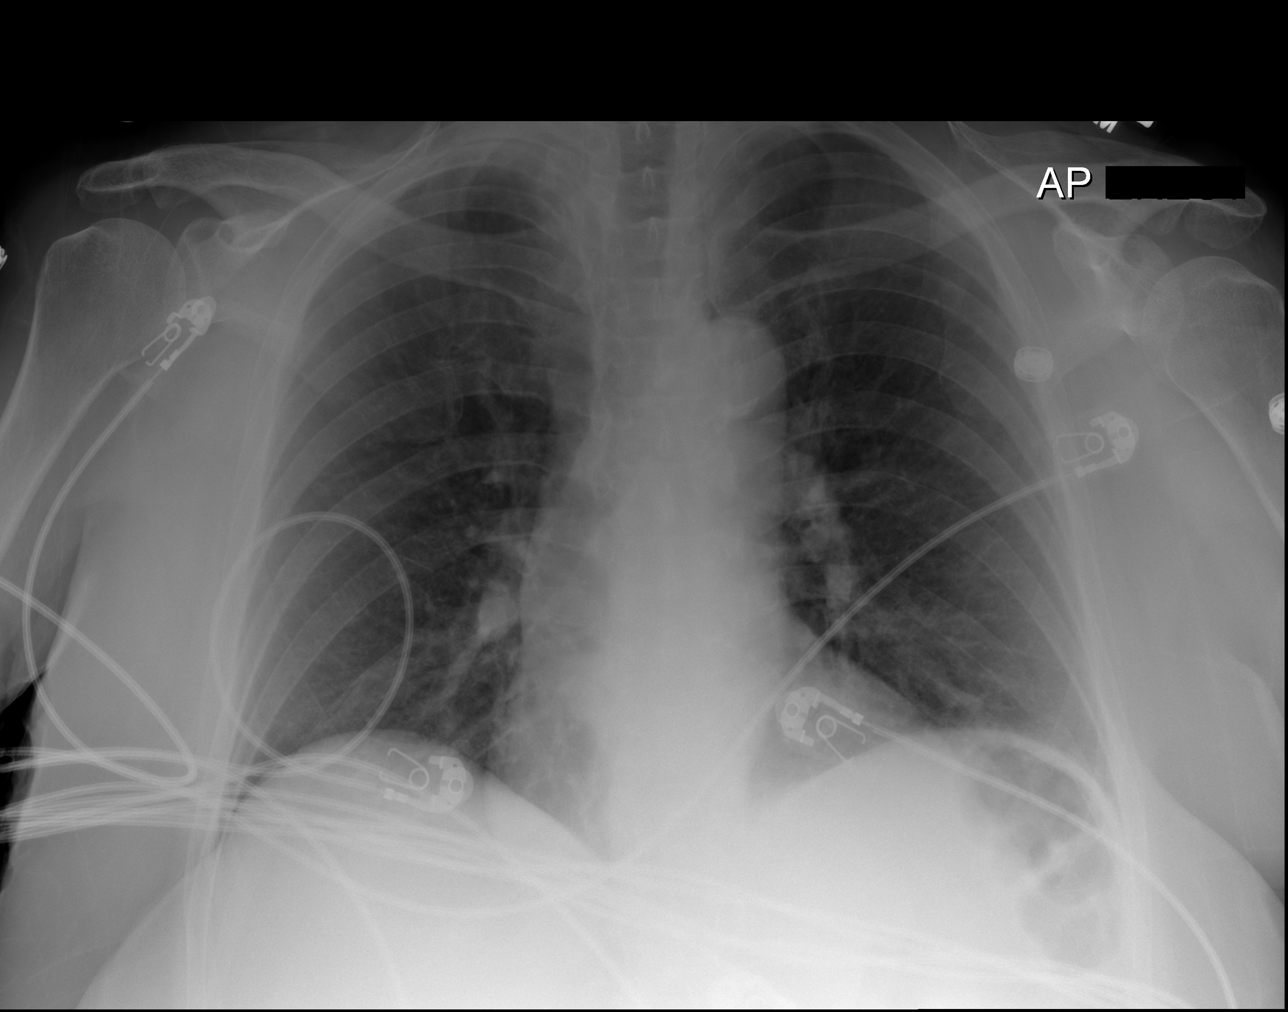

[w chest lat (1 of 2)]
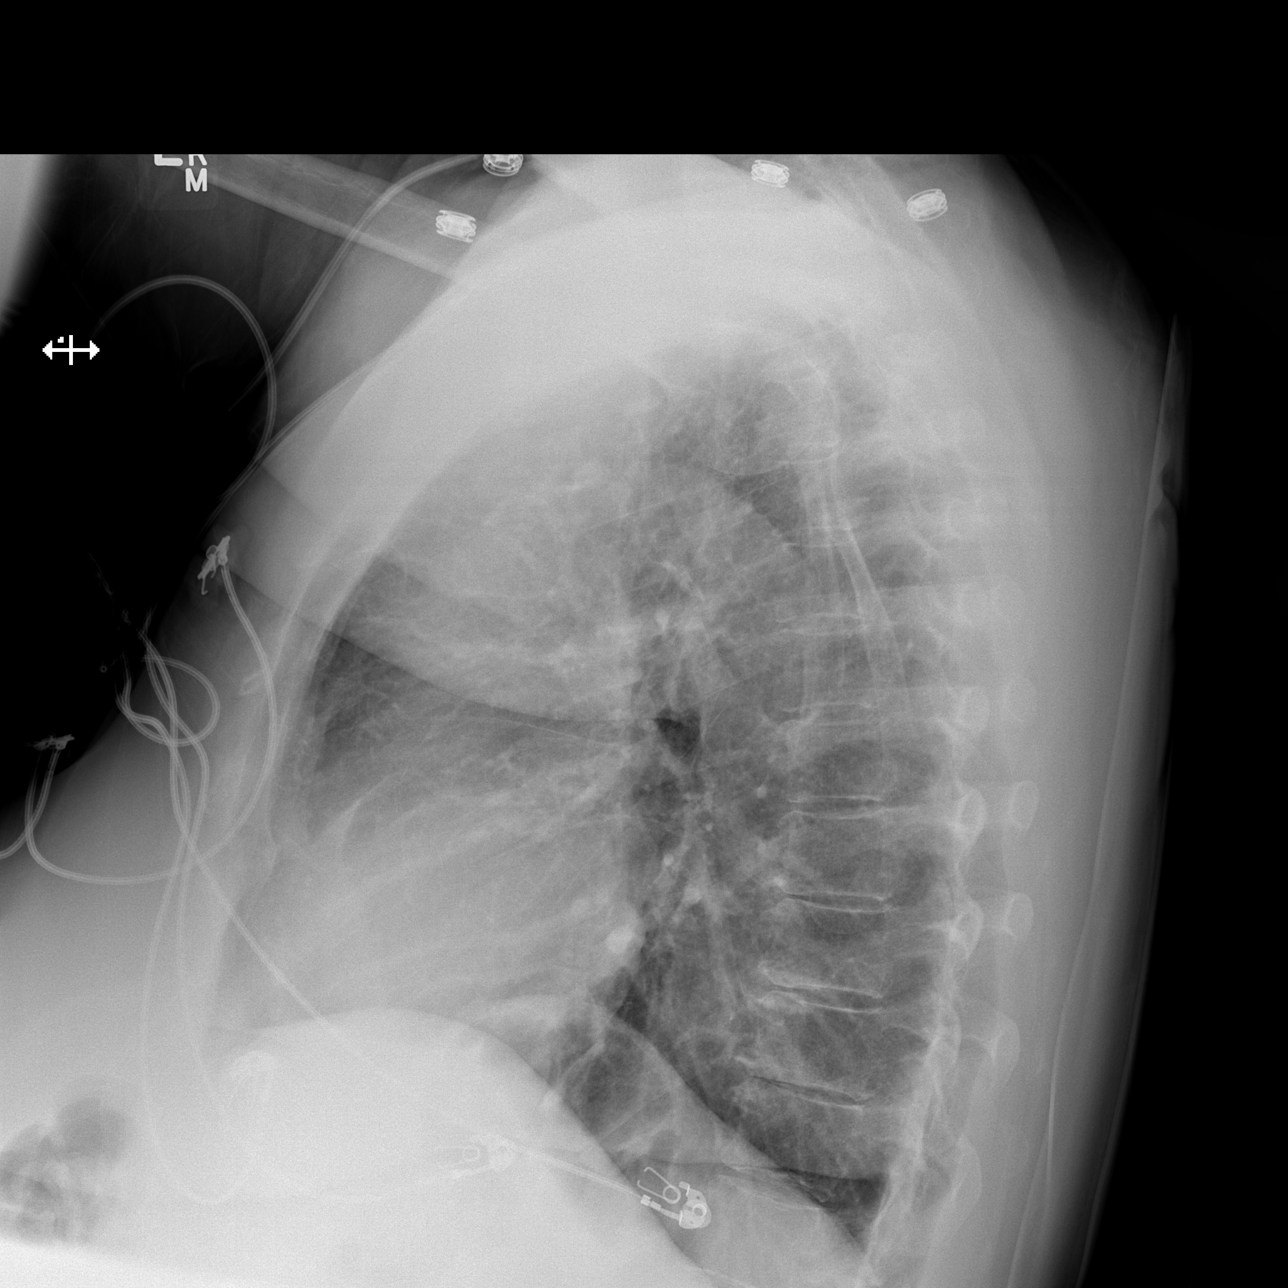

[w chest lat (2 of 2)]
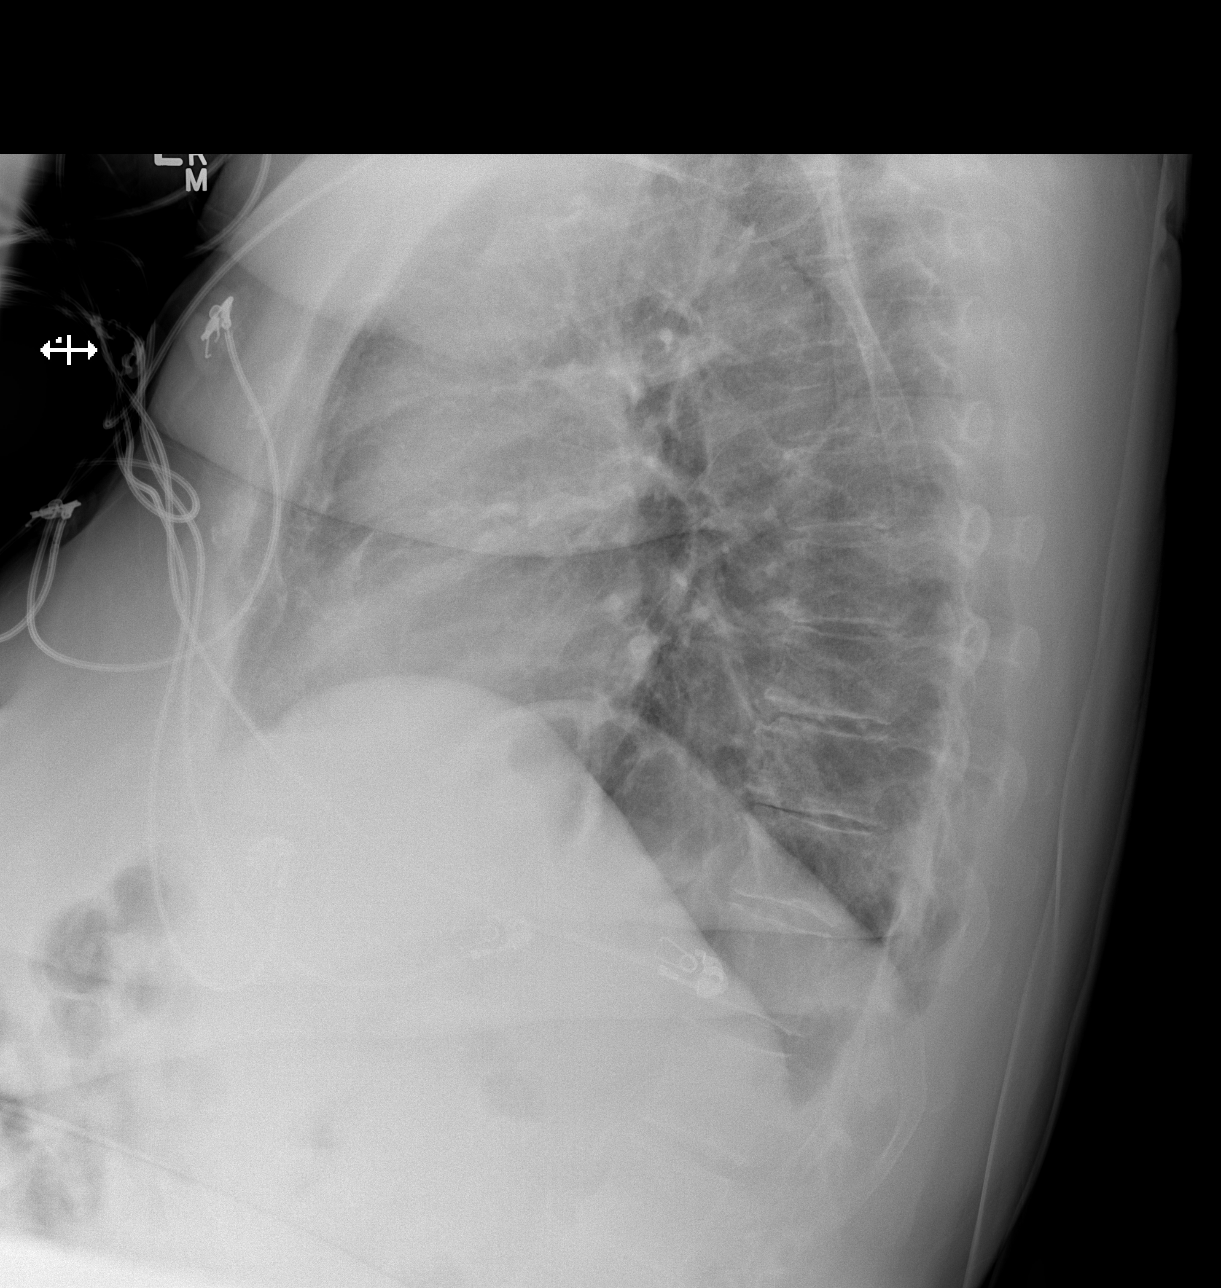

[3 of 3 positions shown; findings below may reference images not displayed]

FINDINGS: The heart size and mediastinal contours are within normal limits.
Atherosclerotic calcification of the aortic arch. Normal pulmonary
vascularity. No focal consolidation, pleural effusion, or
pneumothorax. No acute osseous abnormality.
IMPRESSION: No active cardiopulmonary disease.

## 2019-10-26 ENCOUNTER — Other Ambulatory Visit: Payer: Self-pay

## 2019-10-28 ENCOUNTER — Other Ambulatory Visit: Payer: Self-pay

## 2019-10-28 ENCOUNTER — Encounter: Payer: Self-pay | Admitting: Internal Medicine

## 2019-10-28 ENCOUNTER — Ambulatory Visit: Payer: Medicare Other | Admitting: Internal Medicine

## 2019-10-28 VITALS — BP 130/80 | HR 97 | Ht 64.5 in | Wt 206.0 lb

## 2019-10-28 DIAGNOSIS — E278 Other specified disorders of adrenal gland: Secondary | ICD-10-CM | POA: Diagnosis not present

## 2019-10-28 NOTE — Progress Notes (Signed)
Patient ID: Michelle Park, female   DOB: [birth year], 69 y.o.   MRN: [id number]   This visit occurred during the SARS-CoV-2 public health emergency.  Safety protocols were in place, including screening questions prior to the visit, additional usage of staff PPE, and extensive cleaning of exam room while observing appropriate contact time as indicated for disinfecting solutions.   HPI  Michelle Park is a 69 y.o.-year-old female, initially referred by Dr. Rosendo Gros, returning for follow-up for h/o L adrenal incidentaloma, s/p left adrenalectomy.  Last visit 1 year ago.  Patient's adrenal mass was found in 2019 during investigation for nausea and weight loss.  She had left adrenalectomy on 04/01/2019.   Reviewed her history and imaging tests: CT abdomen/pelvis with contrast (07/24/2018): 5.3 x 2.1 cm mass in left adrenal.    MRI abdomen (08/29/2018): 5.4 x 1.9 x 2.9 cm left adrenal mass with diffuse signal dropout in out of phase images consistent with an adrenal adenoma.  Right adrenal gland normal.  At last visit we checked her adrenal hormone production and she had a mild increase in plasma normetanephrines with normal 24-hour urine metanephrines.  Catecholamine levels were normal except for slightly elevated dopamine:  Office Visit on 11/01/2018  Component Date Value Ref Range Status  . Metanephrine, Free 11/01/2018 50  <=57 pg/mL Final   Comment: . This test was developed and its analytical performance characteristics have been determined by [place], [place]. It has not been cleared or approved by the U.S. Food and Drug Administration. This assay has been validated pursuant to the CLIA regulations and is used for clinical purposes. Marland Kitchen   Darol Destine, Free 11/01/2018 162* <=148 pg/mL Final   Comment: . This test was developed and its analytical performance characteristics have been determined by [place], [place]. It  has not been cleared or approved by the U.S. Food and Drug Administration. This assay has been validated pursuant to the CLIA regulations and is used for clinical purposes. .   . Total Metanephrines-Plasma 11/01/2018 212* <=205 pg/mL Final   Comment: . For additional information, please refer to http://education.questdiagnostics.com/faq/MetFractFree (This link is being provided for informational/educatio informational/educational purposes only.) . Elevations >4-fold upper reference range: strongly suggestive of a pheochromocytoma(1). . Elevations >1- 4-fold upper reference range: significant but not diagnostic, may be due to medications or stress. Suggest running 24 hr urine fractionated metanephrines and/or serum Chromagranin A for confirmation. . Reference: . (1)[place]cir[place]-Schimnich A et al, Plasma Chromogranin A or Urine Fractionated Metanephrines Follow-Up Testing Improves the Diagnostic Accuracy of Plasma Fractionated Metanephrines for Pheochromocytoma. The Journal of Clinical Endocrinology # Metabolism 93(1), 91-95, 2008. . . . This test was developed and its analytical performance characteristics have been determined by [place], [place]. It has not been cleared or a                          pproved by the U.S. Food and Drug Administration. This assay has been validated pursuant to the CLIA regulations and is used for clinical purposes. .   . Epinephrine 11/01/2018 45  pg/mL Final   Comment: . This test was developed and its analytical performance characteristics have been determined by [facility]. It has not been cleared or approved by FDA. This assay has been validated pursuant to the CLIA regulations and is used for clinical purposes.   . Norepinephrine 11/01/2018 495  pg/mL Final   Comment: . This test was developed and its analytical performance characteristics have been  determined by [facility]. It has not been cleared or approved by FDA. This assay has been validated pursuant to the CLIA regulations and is used for clinical purposes.   . Dopamine 11/01/2018 31* pg/mL Final   Comment: . This test was developed and its analytical performance characteristics have been determined by [facility]. It has not been cleared or approved by FDA. This assay has been validated pursuant to the CLIA regulations and is used for clinical purposes.   . Total Catecholamines 11/01/2018 571  pg/mL Final   Comment: . Adult Reference Ranges for Catecholamines, Plasma . Epinephrine           Supine:  <50 pg/mL                       Upright: <95 pg/mL . Norepinephrine        Supine:  112-658 pg/mL                       Upright: [phone] pg/mL . Dopamine              Supine:  <10 pg/mL                       Upright: <20 pg/mL . Total (N+E+D)         Supine:  123-671 pg/mL                       Upright: [phone] pg/mL . Pediatric Reference Ranges for Catecholamines, Plasma . Due to stress, plasma catecholamine levels are generally unreliable in infants and small children. Urinary catecholamine assays are more reliable. Marland Kitchen Epinephrine    3-15 Years         Supine:  < or = 464 pg/mL                       Upright: No Reference                                Range Available . Norepinephrine    3-15 Years         Supine:  < or = 1251 pg/mL                       Upright: No Reference                                Range Available . Dopamine    3-15 Years         Supine:  <60 p                          g/mL                       Upright: No Reference                                Range Available . Pediatric data from [facility] [place] [phone]. . This test was developed and its analytical performance  characteristics have been determined by [facility]. It has not been cleared or approved by FDA. This assay has been validated pursuant to the CLIA regulations and is used for clinical purposes.   . Aldosterone 11/01/2018 5  ng/dL Final   Comment: . Unable to flag abnormal result(s), please refer     to reference range(s) below: . Adult Reference Ranges for Aldosterone, LC/MS/MS:     Upright  8:00 - 10:00 am    < or = 28 ng/dL     Upright  4:00 -  6:00 pm    < or = 21 ng/dL     Supine   8:00 - 10:00 am       3 - 16 ng/dL .   Marland Kitchen Renin Activity 11/01/2018 0.63  0.25 - 5.82 ng/mL/h Final  . ALDO / PRA Ratio 11/01/2018 7.9  0.9 - 28.9 Ratio Final   Comment: . This test was developed and its analytical performance characteristics have been determined by [place], [place]. It has not been cleared or approved by the U.S. Food and Drug Administration. This assay has been validated pursuant to the CLIA regulations and is used for clinical purposes. .   . Potassium 11/01/2018 4.4  3.5 - 5.1 mEq/L Final   24-hour urine potassium results from [facility]: 48 mmol per 24 hours (14-95); urine osmolality 229, serum magnesium 1.8 (1.6-2.3).  24-hour urine metanephrines normal: Component     Latest Ref Rng & Units 12/30/2018  Volume, Urine-VMAUR     mL 1,850  Metanephrines, Ur     90 - 315 mcg/24 h 111  Normetanephr.,U,24h     122 - 676 mcg/24 h 253  Metanephrine, Ur     224 - 832 mcg/24 h 364   I cleared her for adrenal surgery, which she had on 04/01/2019.  Final pathology showed a adrenocortical adenoma.  She also has a history of HL, osteopenia, fibromyalgia, ? Barrett's esophagus, early cataracts.  She does not have a history of HTN, DM, prediabetes, but has had hyponatremia in 2 instances of mild hypokalemia, resolved:   Chemistry      Component Value Date/Time   NA 131 (L) 04/02/2019 0708   K 3.9 04/02/2019 0708   CL 99 04/02/2019 0708   CO2 25 04/02/2019 0708   BUN 14  04/02/2019 0708   CREATININE 0.83 04/02/2019 0708      Component Value Date/Time   CALCIUM 9.0 04/02/2019 0708   ALKPHOS 51 08/19/2018 1505   AST 10 08/19/2018 1505   ALT 13 08/19/2018 1505   BILITOT 0.3 08/19/2018 1505     Lab Results  Component Value Date   NA 131 (L) 04/02/2019   NA 134 (L) 03/25/2019   NA 131 (L) 09/03/2018   NA 130 (L) 08/27/2018   NA 124 (L) 08/19/2018   NA 126 (L) 08/02/2018   NA 128 (L) 07/24/2018   NA 133 (L) 03/21/2015   NA 135 07/16/2010   NA 137 07/12/2009   Lab Results  Component Value Date   K 3.9 04/02/2019   K 4.7 03/25/2019   K 4.4 11/01/2018   K 3.7 09/03/2018   K 3.5 08/27/2018   K 3.6 08/19/2018   K 3.2 (L) 08/02/2018   K 3.2 (L) 07/24/2018   K 3.8 03/21/2015   K 4.1 07/16/2010    She has a history of weight gain after retirement.  He has a history of low  back pain, for which she had surgery in 2019.  ROS: Constitutional: no weight gain/no weight loss, + fatigue, no subjective hyperthermia, no subjective hypothermia Eyes: no blurry vision, no xerophthalmia ENT: no sore throat, no nodules palpated in neck, no dysphagia, no odynophagia, no hoarseness Cardiovascular: no CP/no SOB/no palpitations/no leg swelling Respiratory: no cough/no SOB/no wheezing Gastrointestinal: no N/no V/no D/no C/no acid reflux Musculoskeletal: no muscle aches/no joint aches Skin: no rashes, no hair loss Neurological: no tremors/no numbness/no tingling/+ dizziness  I reviewed pt's medications, allergies, PMH, social hx, family hx, and changes were documented in the history of present illness. Otherwise, unchanged from my initial visit note.  Past Medical History:  Diagnosis Date  . Allergic rhinitis due to pollen   . Anxiety   . Bronchitis    hx of  . Chronic back pain   . COPD (chronic obstructive pulmonary disease) ([place])    asymptomatic at preop 03-25-19 sees pcp Donnie Coffin  . Depression   . Dyspnea    with activity only  . Episodic mood  disorder ([place])   . Fibromyalgia   . GERD (gastroesophageal reflux disease)   . Headache    frequent  . Heart murmur   . Hyperlipidemia   . Hypertension   . Restless leg syndrome    Past Surgical History:  Procedure Laterality Date  . BACK SURGERY     2019 lower 1/2 vertebrae removed Dr. Saintclair Halsted  . ESOPHAGOGASTRODUODENOSCOPY    . EYE SURGERY     cataract bil  . FOOT SURGERY  1979 and 1988  . ingrown toenail     bil great toes   surgery  . ROBOTIC ADRENALECTOMY Left 04/01/2019   Procedure: XI ROBOTIC LEFT ADRENALECTOMY;  Surgeon: Ralene Ok, MD;  Location: [facility];  Service: General;  Laterality: Left;  . TONSILLECTOMY     Social History   Socioeconomic History  . Marital status: Divorced    Spouse name: Not on file  . Number of children: 1  . Years of education: Not on file  . Highest education level: Not on file  Occupational History  . 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 Smoking status: Current Every Day Smoker    Packs/day: 1.00    Years: 45.00    Pack years: 45.00    Types: Cigarettes    Start date: 12/17/2015    Last attempt to quit: 12/17/2015    Years since quitting: 3.8  . Smokeless tobacco: Never Used  Substance and Sexual Activity  . Alcohol use: Not Currently    Alcohol/week: 0.0 standard drinks    Comment: rare glass of wine  . Drug use: No  . Sexual activity: Not Currently  Other Topics Concern  . Not on file  Social History Narrative   Goes by [employer].   1 daughter   Social Determinants of Radio broadcast assistant Strain:   . Difficulty of Paying Living Expenses:   Food Insecurity:   . Worried About Charity fundraiser in the Last Year:   . Arboriculturist in the Last Year:   Transportation Needs:   . Film/video editor (Medical):   Marland Kitchen Lack of Transportation (Non-Medical):   Physical Activity:   . Days of Exercise per Week:   . Minutes of Exercise per Session:   Stress:   . Feeling of Stress :     Social Connections:   . Frequency of Communication with Friends  and Family:   . Frequency of Social Gatherings with Friends and Family:   . Attends Religious Services:   . Active Member of Clubs or Organizations:   . Attends Archivist Meetings:   Marland Kitchen Marital Status:   Intimate Partner Violence:   . Fear of Current or Ex-Partner:   . Emotionally Abused:   Marland Kitchen Physically Abused:   . Sexually Abused:    Current Outpatient Medications on File Prior to Visit  Medication Sig Dispense Refill  . ALPRAZolam (XANAX) 0.5 MG tablet Take 1 tablet (0.5 mg total) by mouth 2 (two) times daily as needed. (Patient taking differently: Take 0.25-0.5 mg by mouth See admin instructions. Take 1 tablet (0.5 mg) by mouth scheduled at bedtime, may take 0.5-1 tablet (0.25-0.5 mg) by mouth daily if needed for anxiety) 60 tablet 0  . amoxicillin (AMOXIL) 500 MG capsule Take 500 mg by mouth QID. Tooth extraction  Done 03-23-19    . calcium-vitamin D (OSCAL WITH D) 500-200 MG-UNIT per tablet Take 1 tablet by mouth 3 (three) times daily with meals. 1500 mg per day for low bone densit    . escitalopram (LEXAPRO) 20 MG tablet Take 20 mg by mouth daily after lunch.    . fenofibrate 160 MG tablet Take 160 mg by mouth daily after supper.     . fluticasone (FLONASE) 50 MCG/ACT nasal spray Place 1 spray into both nostrils daily as needed for allergies or rhinitis.    Marland Kitchen losartan (COZAAR) 100 MG tablet Take 100 mg by mouth daily.     . Magnesium 500 MG TABS Take 500 mg by mouth daily after supper.    . Multiple Vitamin (MULTIVITAMIN WITH MINERALS) TABS tablet Take 1 tablet by mouth daily after lunch. Centrum    . omeprazole (PRILOSEC) 40 MG capsule Take 1 capsule (40 mg total) by mouth 2 (two) times daily. (Patient taking differently: Take 40 mg by mouth daily before breakfast. ) 60 capsule 3  . traMADol (ULTRAM) 50 MG tablet Take 1 tablet (50 mg total) by mouth every 6 (six) hours as needed (postop pain not controlled with  tylenol/ibuprofen). 20 tablet 0   Current Facility-Administered Medications on File Prior to Visit  Medication Dose Route Frequency Provider Last Rate Last Admin  . 0.9 %  sodium chloride infusion  500 mL Intravenous Once Mansouraty, Telford Nab., MD       Allergies  Allergen Reactions  . Aspirin Other (See Comments)    Indigestion/throat burns  . Atorvastatin     REACTION: causes leg pain  . Rosuvastatin Other (See Comments)    Myalgias   Family History  Problem Relation Age of Onset  . Heart disease Mother   . Alzheimer's disease Mother   . Cancer Sister   . Colon cancer Neg Hx   . Esophageal cancer Neg Hx   . Inflammatory bowel disease Neg Hx   . Liver disease Neg Hx   . Pancreatic cancer Neg Hx   . Rectal cancer Neg Hx   . Stomach cancer Neg Hx    PE: BP 130/80   Pulse 97   Ht 5' 4.5" (1.638 m)   Wt 206 lb (93.4 kg)   SpO2 99%   BMI 34.81 kg/m  Wt Readings from Last 3 Encounters:  10/28/19 206 lb (93.4 kg)  04/01/19 205 lb (93 kg)  03/25/19 205 lb (93 kg)   Constitutional: overweight, in NAD Eyes: PERRLA, EOMI, no exophthalmos ENT: moist mucous membranes, no thyromegaly, no  cervical lymphadenopathy Cardiovascular: tachycardia, RR, + 1/6 SEM, no RG Respiratory: CTA B Gastrointestinal: abdomen soft, NT, ND, BS+ Musculoskeletal: no deformities, strength intact in all 4 Skin: moist, warm, no rashes Neurological: + mild tremor with outstretched hands, DTR normal in all 4  ASSESSMENT: 1. Adrenal cortical adenoma, now status post left adrenalectomy  PLAN:  1. Patient with a left adrenal nodule discovered incidentally during investigation for nausea.  She was referred to endocrinology for clearance before her left adrenalectomy.  Hormonal investigation was negative for a hormone producing adenoma and she had her surgery in 03/2019.  Pathology was benign and did not show a hormone producing adenoma. -She is feeling well after the surgery, without new complaints.  She  does mention dizziness, which is may be worse in the last few months but this was present before the surgery.  Also, she continues to have chronic fatigue and headaches.  She did not return sooner for another appointment (last visit I advised her to return to see me 1 month after the surgery) -At this visit, we discussed about the need to check her for adrenal insufficiency, but my suspicion is low based on lack of suggestive symptoms -After this, if labs are normal, she can follow-up with primary care doctor but return to see me if there is a suspicion for adrenal insufficiency in the future.  Orders Placed This Encounter  Procedures  . Cortisol-am, blood  . ACTH   -Total time spent for the appointment: 25 minutes, in obtaining information about her symptoms, reviewing her previous labs, pathology report, evaluations, and treatments, counseling her about her condition and what to expect after her surgery.  Philemon Kingdom, MD PhD [facility]

## 2019-10-28 NOTE — Patient Instructions (Signed)
Please come back for labs at 9 am.  We will schedule a new appt. If the results are abnormal.

## 2019-11-09 ENCOUNTER — Other Ambulatory Visit: Payer: Self-pay

## 2019-11-09 ENCOUNTER — Other Ambulatory Visit (INDEPENDENT_AMBULATORY_CARE_PROVIDER_SITE_OTHER): Payer: Medicare Other

## 2019-11-09 ENCOUNTER — Other Ambulatory Visit: Payer: Medicare Other

## 2019-11-09 DIAGNOSIS — E278 Other specified disorders of adrenal gland: Secondary | ICD-10-CM

## 2019-11-11 LAB — CORTISOL-AM, BLOOD: Cortisol - AM: 24.2 ug/dL — ABNORMAL HIGH

## 2019-11-11 LAB — ACTH: C206 ACTH: 99 pg/mL — ABNORMAL HIGH (ref 6–50)

## 2019-11-14 ENCOUNTER — Other Ambulatory Visit: Payer: Self-pay | Admitting: Internal Medicine

## 2019-11-14 DIAGNOSIS — E278 Other specified disorders of adrenal gland: Secondary | ICD-10-CM

## 2019-12-31 ENCOUNTER — Encounter: Payer: Self-pay | Admitting: Internal Medicine

## 2020-01-04 ENCOUNTER — Other Ambulatory Visit: Payer: Medicare Other

## 2020-01-05 ENCOUNTER — Other Ambulatory Visit: Payer: Self-pay

## 2020-01-05 ENCOUNTER — Other Ambulatory Visit (INDEPENDENT_AMBULATORY_CARE_PROVIDER_SITE_OTHER): Payer: Medicare Other

## 2020-01-05 DIAGNOSIS — E278 Other specified disorders of adrenal gland: Secondary | ICD-10-CM

## 2020-01-05 LAB — CORTISOL: Cortisol, Plasma: 19 ug/dL

## 2020-01-09 LAB — ACTH: C206 ACTH: 78 pg/mL — ABNORMAL HIGH (ref 6–50)

## 2020-01-11 ENCOUNTER — Other Ambulatory Visit: Payer: Self-pay | Admitting: Internal Medicine

## 2020-01-11 DIAGNOSIS — E270 Other adrenocortical overactivity: Secondary | ICD-10-CM

## 2020-01-11 DIAGNOSIS — E278 Other specified disorders of adrenal gland: Secondary | ICD-10-CM

## 2020-01-12 ENCOUNTER — Other Ambulatory Visit (INDEPENDENT_AMBULATORY_CARE_PROVIDER_SITE_OTHER): Payer: Medicare Other

## 2020-01-12 ENCOUNTER — Encounter: Payer: Self-pay | Admitting: Gastroenterology

## 2020-01-12 ENCOUNTER — Ambulatory Visit: Payer: Medicare Other | Admitting: Gastroenterology

## 2020-01-12 ENCOUNTER — Other Ambulatory Visit: Payer: Self-pay

## 2020-01-12 VITALS — BP 134/64 | HR 84 | Ht 64.5 in | Wt 199.0 lb

## 2020-01-12 DIAGNOSIS — R42 Dizziness and giddiness: Secondary | ICD-10-CM

## 2020-01-12 DIAGNOSIS — K3189 Other diseases of stomach and duodenum: Secondary | ICD-10-CM | POA: Diagnosis not present

## 2020-01-12 DIAGNOSIS — Z8719 Personal history of other diseases of the digestive system: Secondary | ICD-10-CM

## 2020-01-12 DIAGNOSIS — K31A0 Gastric intestinal metaplasia, unspecified: Secondary | ICD-10-CM

## 2020-01-12 DIAGNOSIS — Z01818 Encounter for other preprocedural examination: Secondary | ICD-10-CM

## 2020-01-12 DIAGNOSIS — Z1211 Encounter for screening for malignant neoplasm of colon: Secondary | ICD-10-CM | POA: Diagnosis not present

## 2020-01-12 LAB — BASIC METABOLIC PANEL
BUN: 17 mg/dL (ref 6–23)
CO2: 26 mEq/L (ref 19–32)
Calcium: 9.8 mg/dL (ref 8.4–10.5)
Chloride: 101 mEq/L (ref 96–112)
Creatinine, Ser: 0.9 mg/dL (ref 0.40–1.20)
GFR: 61.99 mL/min (ref >60.00)
Glucose, Bld: 104 mg/dL — ABNORMAL HIGH (ref 70–99)
Potassium: 4.1 mEq/L (ref 3.5–5.1)
Sodium: 133 mEq/L — ABNORMAL LOW (ref 135–145)

## 2020-01-12 LAB — MAGNESIUM: Magnesium: 1.6 mg/dL (ref 1.5–2.5)

## 2020-01-12 LAB — PHOSPHORUS: Phosphorus: 3.2 mg/dL (ref 2.3–4.6)

## 2020-01-12 MED ORDER — SUTAB 1479-225-188 MG PO TABS: 24.0000 | ORAL_TABLET | ORAL | 0 refills | Status: DC

## 2020-01-12 NOTE — Patient Instructions (Addendum)
If you are age 70 or older, your body mass index should be between 23-30. Your Body mass index is 33.63 kg/m. If this is out of the aforementioned range listed, please consider follow up with your Primary Care Provider.  If you are age 54 or younger, your body mass index should be between 19-25. Your Body mass index is 33.63 kg/m. If this is out of the aformentioned range listed, please consider follow up with your Primary Care Provider.   We have sent the following medications to your pharmacy for you to pick up at your convenience: Sutab   You have been scheduled for an endoscopy and colonoscopy. Please follow the written instructions given to you at your visit today. Please pick up your prep supplies at the pharmacy within the next 1-3 days. If you use inhalers (even only as needed), please bring them with you on the day of your procedure.  Your provider has requested that you go to the basement level for lab work before leaving today. Press "B" on the elevator. The lab is located at the first door on the left as you exit the elevator.  Due to recent changes in healthcare laws, you may see the results of your imaging and laboratory studies on MyChart before your provider has had a chance to review them.  We understand that in some cases there may be results that are confusing or concerning to you. Not all laboratory results come back in the same time frame and the provider may be waiting for multiple results in order to interpret others.  Please give [place] 48 hours in order for your provider to thoroughly review all the results before contacting the office for clarification of your results.    Thank you for choosing me and [facility].  Dr. Rush Landmark

## 2020-01-15 ENCOUNTER — Encounter: Payer: Self-pay | Admitting: Gastroenterology

## 2020-01-15 DIAGNOSIS — Z0000 Encounter for general adult medical examination without abnormal findings: Secondary | ICD-10-CM | POA: Insufficient documentation

## 2020-01-15 DIAGNOSIS — Z8719 Personal history of other diseases of the digestive system: Secondary | ICD-10-CM | POA: Insufficient documentation

## 2020-01-15 DIAGNOSIS — R42 Dizziness and giddiness: Secondary | ICD-10-CM | POA: Insufficient documentation

## 2020-01-15 DIAGNOSIS — Z01818 Encounter for other preprocedural examination: Secondary | ICD-10-CM | POA: Insufficient documentation

## 2020-01-15 DIAGNOSIS — K31A0 Gastric intestinal metaplasia, unspecified: Secondary | ICD-10-CM | POA: Insufficient documentation

## 2020-01-15 NOTE — Progress Notes (Addendum)
[place] VISIT   Primary Care Provider [place], L.Marlou Sa, [place] [employer] [place] [address] [phone]  Patient Profile: Michelle Park is a 69 y.o. female with a pmh significant for GERD, hypertension, hyperlipidemia, chronic back pain, adrenal adenoma status post resection, Barrett's esophagus.  The patient presents to the [facility] for an evaluation and management of problem(s) noted below:  Problem List 1. History of Barrett's esophagus   2. Intestinal metaplasia of gastric mucosa   3. Colon cancer screening   4. Vertigo   5. Preop examination     History of Present Illness Please see initial consultation note for full details of HPI.  Interval History The patient is here for scheduled follow-up.  I have not seen her since February of last year at the time of her endoscopy.  At the time we found evidence of Barrett's esophagus as well as some gastric metaplasia but no H. pylori.  She has subsequently had an adrenalectomy.  She follows with endocrinology in regards to her overall steroid management at this time.  Patient is not having any recurrence of significant nausea or vomiting at this time.  She feels relatively well.  She is apologetic for not coming back sooner in regards to the planned follow-up endoscopy but otherwise is ready for colon cancer screening as well at this time.  She is 10 years from her last colonoscopy.  She has not had any other significant GI symptoms or changes in her bowel habits.  She denies any blood in the stool (melena/hematochezia/maroon).  Patient has been dealing with occasional vertiginous-like symptoms and is seeing her primary care doctor about this.  She has never seen ENT about this.   GI Review of Systems Positive as above Negative for odynophagia, dysphagia, pain  Review of Systems General: Denies fevers/chills Cardiovascular: Denies chest pain Pulmonary: Denies  shortness of breath Gastroenterological: See HPI Genitourinary: Denies darkened urine Hematological: Denies easy bruising/bleeding Dermatological: Denies jaundice Psychological: Mood is stable   Medications Current Outpatient Medications  Medication Sig Dispense Refill   ALPRAZolam (XANAX) 0.5 MG tablet Take 1 tablet (0.5 mg total) by mouth 2 (two) times daily as needed. (Patient taking differently: Take 0.25-0.5 mg by mouth See admin instructions. Take 1 tablet (0.5 mg) by mouth scheduled at bedtime, may take 0.5-1 tablet (0.25-0.5 mg) by mouth daily if needed for anxiety) 60 tablet 0   calcium-vitamin D (OSCAL WITH D) 500-200 MG-UNIT per tablet Take 1 tablet by mouth 3 (three) times daily with meals. 1500 mg per day for low bone densit     escitalopram (LEXAPRO) 20 MG tablet Take 20 mg by mouth daily after lunch.     fenofibrate 160 MG tablet Take 160 mg by mouth daily after supper.      losartan (COZAAR) 100 MG tablet Take 100 mg by mouth daily.      Multiple Vitamin (MULTIVITAMIN WITH MINERALS) TABS tablet Take 1 tablet by mouth daily after lunch. Centrum     omeprazole (PRILOSEC) 40 MG capsule Take 1 capsule (40 mg total) by mouth 2 (two) times daily. (Patient taking differently: Take 40 mg by mouth daily before breakfast. ) 60 capsule 3   Sodium Sulfate-Mag Sulfate-KCl (SUTAB) [phone] MG TABS Take 24 tablets by mouth as directed. 24 tablet 0   Current Facility-Administered Medications  Medication Dose Route Frequency Provider Last Rate Last Admin   0.9 %  sodium chloride infusion  500 mL Intravenous Once Mansouraty, Valarie Merino  Brooke Bonito., MD        Allergies Allergies  Allergen Reactions   Aspirin Other (See Comments)    Indigestion/throat burns   Atorvastatin     REACTION: causes leg pain   Rosuvastatin Other (See Comments)    Myalgias    Histories Past Medical History:  Diagnosis Date   Allergic rhinitis due to pollen    Anxiety    Bronchitis    hx of    Chronic back pain    COPD (chronic obstructive pulmonary disease) (HCC)    asymptomatic at preop 03-25-19 sees pcp Donnie Coffin   Depression    Dyspnea    with activity only   Episodic mood disorder (HCC)    Fibromyalgia    GERD (gastroesophageal reflux disease)    Headache    frequent   Heart murmur    Hyperlipidemia    Hypertension    Restless leg syndrome    Past Surgical History:  Procedure Laterality Date   BACK SURGERY     2019 lower 1/2 vertebrae removed Dr. 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    Comment: Retired  Tobacco Use   Smoking status: Current Every Day Smoker    Packs/day: 1.00    Years: 45.00    Pack years: 45.00    Types: Cigarettes    Start date: 12/17/2015    Last attempt to quit: 12/17/2015    Years since quitting: 4.0   Smokeless tobacco: Never Used  Vaping Use   Vaping Use: Never used  Substance and Sexual Activity   Alcohol use: Not Currently    Alcohol/week: 0.0 standard drinks    Comment: rare glass of wine   Drug use: No   Sexual activity: Not Currently  Other Topics Concern   Not on file  Social History Narrative   Goes by [employer].   1 daughter   Social Determinants of Radio broadcast assistant Strain:    Difficulty of Paying Living Expenses:   Food Insecurity:    Worried About Charity fundraiser in the Last Year:    Arboriculturist in the Last Year:    Transportation Needs:    Film/video editor (Medical):    Lack of Transportation (Non-Medical):   Physical Activity:    Days of Exercise per Week:    Minutes of Exercise per Session:   Stress:    Feeling of Stress :   Social Connections:    Frequency of Communication with Friends and Family:    Frequency of Social Gatherings with Friends and Family:    Attends Religious Services:    Active Member of Clubs or Organizations:    Attends Archivist Meetings:    Marital Status:   Intimate Partner Violence:    Fear of Current or Ex-Partner:  Emotionally Abused:    Physically Abused:    Sexually Abused:    Family History  Problem Relation Age of Onset   Heart disease Mother    Alzheimer's disease Mother    Cancer Sister    Colon cancer Neg Hx    Esophageal cancer Neg Hx    Inflammatory bowel disease Neg Hx    Liver disease Neg Hx    Pancreatic cancer Neg Hx    Rectal cancer Neg Hx    Stomach cancer Neg Hx    I have reviewed her medical, social, and family history in detail and updated the electronic medical record as necessary.    PHYSICAL EXAMINATION  BP 134/64    Pulse 84    Ht 5' 4.5" (1.638 m)    Wt 199 lb (90.3 kg)    BMI 33.63 kg/m  Wt Readings from Last 3 Encounters:  01/12/20 199 lb (90.3 kg)  10/28/19 206 lb (93.4 kg)  04/01/19 205 lb (93 kg)  GEN: NAD, appears stated age, doesn't appear chronically ill, accompanied by friend PSYCH: Cooperative, without pressured speech EYE: Conjunctivae pale-pink, sclerae anicteric ENT: MMM CV: Nontachycardic RESP: No audible wheezing GI: NABS, soft, mildly protuberant, rounded, nontender, without rebound MSK/EXT: No lower extremity edema SKIN: No jaundice NEURO:  Alert & Oriented x 3, no focal deficits   REVIEW OF DATA  I reviewed the following data at the time of this encounter:  GI Procedures and Studies  February 2020 EGD - No gross lesions in esophagus. Biopsied. -  Salmon-colored mucosa suggestive of short-segment Barrett's esophagus. Biopsied. - No gross lesions in the stomach. Biopsied. - No gross lesions in the duodenal bulb, in the first portion of the duodenum and in the second portion of the duodenum. Biopsied.   Pathology Diagnosis 1. Surgical [P], duodenal - BENIGN SMALL BOWEL MUCOSA. - NO ACTIVE INFLAMMATION OR VILLOUS ATROPHY IDENTIFIED. 2. 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 - THERE IS NO EVIDENCE OF HELICOBACTER PYLORI, DYPLASIA OR MALIGNANCY. - SEE COMMENT. 3. Surgical [P], GE junction - INTESTINAL METAPLASIA (GOBLET CELL METAPLASIA) CONSISTENT WITH BARRETT'S ESOPHAGUS. - THERE IS NO EVIDENCE OF DYSPLASIA OR MALIGNANCY. 4. Surgical [P], random esophageal - BENIGN SQUAMOUS MUCOSA. - THERE IS NO EVIDENCE OF INCREASE IN EOSINOPHILS, DYSPLASIA OR MALIGNANCY.  2011 Colonoscopy - [facility] 2 sessile polyps in the [place] removed with cold forceps.  Few small diverticula were found in the [place].  Otherwise normal examination.  Plan was for a 5-year follow up colonoscopy.  Laboratory Studies  Reviewed in epic  Imaging Studies  No new relevant imaging studies to review   ASSESSMENT  Ms. Awtrey is a 69 y.o. female with a pmh significant for GERD, hypertension, hyperlipidemia, chronic back pain, adrenal adenoma status post resection, Barrett's esophagus.  The patient is seen today for evaluation and management of:  1. History of Barrett's esophagus   2. Intestinal metaplasia of gastric mucosa   3. Colon cancer screening   4. Vertigo   5. Preop examination    The patient is hemodynamically and clinically stable.  She has done well since her last evaluation with me and her endoscopy.  Nausea is no longer present.  Weight is stable.  Patient was found to have Barrett's esophagus and needs a true repeat Barrett's surveillance before we go to a surveillance protocol.  She also had intestinal  metaplasia of the gastric mucosa and will need to have gastric mapping performed.  She will  be due for colon cancer screening as well and we will plan to do both of these procedures together.  The risks and benefits of endoscopic evaluation were discussed with the patient; these include but are not limited to the risk of perforation, infection, bleeding, missed lesions, lack of diagnosis, severe illness requiring hospitalization, as well as anesthesia and sedation related illnesses.  The patient is agreeable to proceed.  All patient questions were answered, to the best of my ability, and the patient agrees to the aforementioned plan of action with follow-up as indicated.   PLAN  Laboratories as outlined below to ensure adequacy prior to potential endoscopic evaluation Endoscopy with Barrett's surveillance and gastric mapping Colonoscopy for colon cancer screening Follow-up with PCP about vertiginous-like symptoms Follow-up with endocrinology for history of adrenal adenoma   Orders Placed This Encounter  Procedures   SARS Coronavirus 2 ([facility] End[facility]/Gastro ONLY)   Basic Metabolic Panel (BMET)   Magnesium   Phosphorus   Ambulatory referral to Gastroenterology    New Prescriptions   SODIUM SULFATE-MAG SULFATE-KCL (SUTAB) [id number] MG TABS    Take 24 tablets by mouth as directed.   Modified Medications   No medications on file    Planned Follow Up: No follow-ups on file.   Total Time in Face-to-Face and in Coordination of Care for patient including independent/personal interpretation/review of prior testing, medical history, examination, medication adjustment, communicating results with the patient directly, and documentation with the EHR is 27 minutes.   Justice Britain, MD [facility]oscopy Office # [phone]

## 2020-03-06 ENCOUNTER — Encounter: Payer: Medicare Other | Admitting: Gastroenterology

## 2020-03-27 ENCOUNTER — Other Ambulatory Visit: Payer: Self-pay

## 2020-03-27 ENCOUNTER — Encounter: Payer: Self-pay | Admitting: Family Medicine

## 2020-03-27 ENCOUNTER — Ambulatory Visit (INDEPENDENT_AMBULATORY_CARE_PROVIDER_SITE_OTHER): Payer: Medicare Other | Admitting: Family Medicine

## 2020-03-27 DIAGNOSIS — H8110 Benign paroxysmal vertigo, unspecified ear: Secondary | ICD-10-CM | POA: Diagnosis not present

## 2020-03-27 MED ORDER — OMEPRAZOLE 40 MG PO CPDR: 40.0000 mg | DELAYED_RELEASE_CAPSULE | Freq: Every day | ORAL | Status: DC

## 2020-03-27 NOTE — Progress Notes (Signed)
This visit occurred during the SARS-CoV-2 public health emergency.  Safety protocols were in place, including screening questions prior to the visit, additional usage of staff PPE, and extensive cleaning of exam room while observing appropriate contact time as indicated for disinfecting solutions.  "Dizzy spells."  Started about 2 years ago.  Had seen [facility] 03/16/2020.  Episodic sx, episodes can last 30 min or less.  Quick onset.  The room is spinning with events.  No syncope.  No chest pain with the episode.  Not lightheaded.  No new meds.  No new hearing changes.    Meds, vitals, and allergies reviewed.   ROS: Per HPI unless specifically indicated in ROS section   GEN: nad, alert and oriented HEENT: mucous membranes moist, bilateral cerumen noted. NECK: supple w/o LA CV: rrr.  She has a systolic ejection murmur that is an old finding per patient report. PULM: ctab, no inc wob ABD: soft, +bs EXT: no edema SKIN: Well-perfused. CN 2-12 wnl B, S/S/DTR wnl x4 Dix-Hallpike testing negative.

## 2020-03-27 NOTE — Patient Instructions (Addendum)
It sounds like you have benign positional vertigo.   Use the bedside exercises and update me as needed.   Ask the front to sign a record release from [facility].   Take care.  Glad to see you. We'll go from there.

## 2020-03-28 ENCOUNTER — Other Ambulatory Visit: Payer: Self-pay | Admitting: Family Medicine

## 2020-03-28 DIAGNOSIS — Z1231 Encounter for screening mammogram for malignant neoplasm of breast: Secondary | ICD-10-CM

## 2020-04-01 DIAGNOSIS — H8110 Benign paroxysmal vertigo, unspecified ear: Secondary | ICD-10-CM | POA: Insufficient documentation

## 2020-04-01 NOTE — Assessment & Plan Note (Signed)
No symptoms now, Dix-Hallpike testing negative, normal neurologic exam.  Likely benign positional vertigo.  Anatomy and pathophysiology discussed with patient.  She can try home exercises and then update [place] as needed.  I asked her to sign a record release from [facility] [facility].

## 2020-04-05 ENCOUNTER — Other Ambulatory Visit: Payer: Self-pay

## 2020-04-05 ENCOUNTER — Ambulatory Visit
Admission: RE | Admit: 2020-04-05 | Discharge: 2020-04-05 | Disposition: A | Discharge status: Home / Self Care | Payer: Medicare Other | Source: Ambulatory Visit | Attending: Family Medicine | Admitting: Family Medicine

## 2020-04-05 DIAGNOSIS — Z1231 Encounter for screening mammogram for malignant neoplasm of breast: Secondary | ICD-10-CM

## 2020-04-23 ENCOUNTER — Telehealth: Payer: Self-pay | Admitting: *Deleted

## 2020-04-23 DIAGNOSIS — R42 Dizziness and giddiness: Secondary | ICD-10-CM

## 2020-04-23 NOTE — Telephone Encounter (Signed)
Will defer to Dr Damita Dunnings for decision on f/u OV or ENT referral.   plz ask patient - when did facial tingling start, is it ongoing? Is it same side as prior bell's palsy? Any other neurological symptom besides vertigo?

## 2020-04-23 NOTE — Telephone Encounter (Signed)
[facility] - Client TELEPHONE ADVICE RECORD [facility] Patient Name: Michelle Park Gender: Female DOB: 1951-06-30 Age: 69 Y 9 M 4 D Return Phone Number: [phone] (Primary) Address: City/State/Zip: [place] [address] Client [facility] - Client Client Site [facility] Physician Renford Dills - MD Contact Type Call Who Is Calling Patient / Member / Family / Caregiver Call Type Triage / Clinical Relationship To Patient Self Return Phone Number [phone] (Primary) Chief Complaint Dizziness Reason for Call Symptomatic / Request for Health Information Initial Comment Caller's was seen for vertigo and is afraid of falling now. Translation No Nurse Assessment Nurse: Windle Guard, RN, Lesa Date/Time (Eastern Time): 04/23/2020 11:38:35 AM Confirm and document reason for call. If symptomatic, describe symptoms. ---Caller states she still has vertigo Has the patient had close contact with a person known or suspected to have the novel coronavirus illness OR traveled / lives in area with major community spread (including international travel) in the last 14 days from the onset of symptoms? * If Asymptomatic, screen for exposure and travel within the last 14 days. ---No Does the patient have any new or worsening symptoms? ---Yes Will a triage be completed? ---Yes Related visit to physician within the last 2 weeks? ---No Does the PT have any chronic conditions? (i.e. diabetes, asthma, this includes High risk factors for pregnancy, etc.) ---Yes List chronic conditions. ---HTN Is this a behavioral health or substance abuse call? ---No Guidelines Guideline Title Affirmed Question Affirmed Notes Nurse Date/Time (Eastern Time) Dizziness - Vertigo [1] Numbness (i.e., loss of sensation) of the face, arm / hand, or leg / foot on one side of the body AND [2] sudden onset AND [3] brief (now gone) Actor, Therapist, sports,  Lesa 04/23/2020 11:39:56 AM Disp. Time Eilene Ghazi Time) Disposition Final User PLEASE NOTE: All timestamps contained within this report are represented as [place] Standard Time. CONFIDENTIALTY NOTICE: This fax transmission is intended only for the addressee. It contains information that is legally privileged, confidential or otherwise protected from use or disclosure. If you are not the intended recipient, you are strictly prohibited from reviewing, disclosing, copying using or disseminating any of this information or taking any action in reliance on or regarding this information. If you have received this fax in error, please notify us immediately by telephone so that we can arrange for its return to [place]. Phone: [phone], Toll-Free: [phone], Fax: [phone] Page: 2 of 2 Call Id: [id number] 04/23/2020 11:43:47 AM Go to ED Now (or PCP triage) Yes Windle Guard, RN, Lesa Caller Disagree/Comply Disagree Caller Understands Yes PreDisposition Did not know what to do Care Advice Given Per Guideline GO TO ED NOW (OR PCP TRIAGE): * IF NO PCP (PRIMARY CARE PROVIDER) SECOND-LEVEL TRIAGE: You need to be seen within the next hour. Go to the [facility] at _____________ [place] as soon as you can. ANOTHER ADULT SHOULD DRIVE: * It is better and safer if another adult drives instead of you. BRING MEDICINES: * Bring the pill bottles too. This will help the doctor (or NP/PA) to make certain you are taking the right medicines and the right dose. CARE ADVICE given per Dizziness - Vertigo (Adult) guideline. Comments User: Starleen Blue, RN Date/Time Eilene Ghazi Time): 04/23/2020 11:44:35 AM Caller states she would like an appt at the office User: Starleen Blue, RN Date/Time Eilene Ghazi Time): 04/23/2020 11:46:42 AM Call information given to the office Referrals [facility] REFUSED

## 2020-04-23 NOTE — Telephone Encounter (Signed)
Patient called to schedule an appointment with Dr. Damita Dunnings to follow-up on vertigo. Patient was transferred to access nurse and was advised that she needed to go to the ER. Patient declined to go to the ER. Access  nurse called and advised the office of this and requested that someone from the office call to talk with the patient. Called and spoke to patient and was advised that she has been having tingling off and on in her face for a while. Patient stated that she has seen Dr. Damita Dunnings for the vertigo. Patient stated that she has been doing the exercises that he has recommended. Patient stated that she was just calling to schedule an appointment with Dr. Damita Dunnings to get a referral from him to see either an ENT or neurologist. Patient stated that she has a history of bells palsy. Patient wanted to know if she needs to come in to be seen or if Dr. Damita Dunnings can refer her since he has already seen her? Patient was given ER precautions and she verbalized understanding.

## 2020-04-24 NOTE — Telephone Encounter (Signed)
I put in the referral for ENT.   I think it is best to recheck her in office re: facial numbness.  Please schedule when possible.   Thanks.

## 2020-04-24 NOTE — Telephone Encounter (Signed)
Patient advised. Appointment scheduled.  

## 2020-04-24 NOTE — Telephone Encounter (Signed)
I need extra information.  See Dr. Synthia Innocent note.  I am fine with referring her to ENT for vertigo if needed but I need clarification on the facial tingling.    Does she have any other neurologic symptoms?   Does she have any weakness?   How long has the facial tingling been going on?   What is the distribution of the tingling?    She may end up needing a referral to ENT and neurology.  Please let me know about the above.  Thanks.

## 2020-04-24 NOTE — Telephone Encounter (Signed)
Does she have any other neurologic symptoms?  tingling on left side of face intermittently Does she have any weakness?  No How long has the facial tingling been going on?  month or so What is the distribution of the tingling?  around mouth mostly on left side  She may end up needing a referral to ENT and neurology.  Please let me know about the above.  Thanks.

## 2020-04-25 ENCOUNTER — Encounter: Payer: Self-pay | Admitting: Family Medicine

## 2020-04-26 ENCOUNTER — Other Ambulatory Visit: Payer: Self-pay

## 2020-04-26 ENCOUNTER — Encounter: Payer: Self-pay | Admitting: Family Medicine

## 2020-04-26 ENCOUNTER — Ambulatory Visit (INDEPENDENT_AMBULATORY_CARE_PROVIDER_SITE_OTHER): Payer: Medicare Other | Admitting: Family Medicine

## 2020-04-26 VITALS — BP 138/84 | HR 87 | Temp 97.3°F | Ht 64.5 in | Wt 203.6 lb

## 2020-04-26 DIAGNOSIS — R011 Cardiac murmur, unspecified: Secondary | ICD-10-CM | POA: Diagnosis not present

## 2020-04-26 DIAGNOSIS — L989 Disorder of the skin and subcutaneous tissue, unspecified: Secondary | ICD-10-CM | POA: Diagnosis not present

## 2020-04-26 DIAGNOSIS — R202 Paresthesia of skin: Secondary | ICD-10-CM

## 2020-04-26 LAB — CBC WITH DIFFERENTIAL/PLATELET
Basophils Absolute: 0.1 10*3/uL (ref 0.0–0.1)
Basophils Relative: 1.3 % (ref 0.0–3.0)
Eosinophils Absolute: 0.2 10*3/uL (ref 0.0–0.7)
Eosinophils Relative: 2.8 % (ref 0.0–5.0)
HCT: 38.4 % (ref 36.0–46.0)
Hemoglobin: 12.7 g/dL (ref 12.0–15.0)
Lymphocytes Relative: 35.6 % (ref 12.0–46.0)
Lymphs Abs: 3.1 10*3/uL (ref 0.7–4.0)
MCHC: 33 g/dL (ref 30.0–36.0)
MCV: 87.6 fl (ref 78.0–100.0)
Monocytes Absolute: 0.8 10*3/uL (ref 0.1–1.0)
Monocytes Relative: 9.4 % (ref 3.0–12.0)
Neutro Abs: 4.5 10*3/uL (ref 1.4–7.7)
Neutrophils Relative %: 50.9 % (ref 43.0–77.0)
Platelets: 403 10*3/uL — ABNORMAL HIGH (ref 150.0–400.0)
RBC: 4.38 Mil/uL (ref 3.87–5.11)
RDW: 14.9 % (ref 11.5–15.5)
WBC: 8.8 10*3/uL (ref 4.0–10.5)

## 2020-04-26 LAB — COMPREHENSIVE METABOLIC PANEL
ALT: 12 U/L (ref 0–35)
AST: 14 U/L (ref 0–37)
Albumin: 4.1 g/dL (ref 3.5–5.2)
Alkaline Phosphatase: 44 U/L (ref 39–117)
BUN: 19 mg/dL (ref 6–23)
CO2: 29 mEq/L (ref 19–32)
Calcium: 10.4 mg/dL (ref 8.4–10.5)
Chloride: 104 mEq/L (ref 96–112)
Creatinine, Ser: 1.01 mg/dL (ref 0.40–1.20)
GFR: 54.22 mL/min — ABNORMAL LOW (ref >60.00)
Glucose, Bld: 94 mg/dL (ref 70–99)
Potassium: 4.6 mEq/L (ref 3.5–5.1)
Sodium: 139 mEq/L (ref 135–145)
Total Bilirubin: 0.4 mg/dL (ref 0.2–1.2)
Total Protein: 6.6 g/dL (ref 6.0–8.3)

## 2020-04-26 LAB — TSH: TSH: 2.62 u[IU]/mL (ref 0.35–4.50)

## 2020-04-26 LAB — VITAMIN B12: Vitamin B-12: 355 pg/mL (ref 211–911)

## 2020-04-26 NOTE — Patient Instructions (Signed)
Go to the lab on the way out.   If you have mychart we'll likely use that to update you.    Don't change your meds for now.  We'll see about possible imaging after I get your labs back.  Take care.  Glad to see you.

## 2020-04-26 NOTE — Progress Notes (Signed)
This visit occurred during the SARS-CoV-2 public health emergency.  Safety protocols were in place, including screening questions prior to the visit, additional usage of staff PPE, and extensive cleaning of exam room while observing appropriate contact time as indicated for disinfecting solutions.  Facial tingling on the L side of the face.  H/o L sided Bell's in distant past.  In L V2 and V3 distribution.  No R sided sx.  No new weakness in the face.  She has episodic altered but not absent sensation.  No rash.  Going on for weeks intermittently.  No dysphagia.  No speech changes.  No ear pain.  No numbness or tingling in the ext.    Skin lesion on the back.  H/o surgery 08/2017 and she wanted that checked.  H/o seroma in the past.  Nonbothersome.    She is still having trouble with vertigo with ENT f/u pending.  She still has a pulsating in her ears, noted in the AM.    Meds, vitals, and allergies reviewed.   ROS: Per HPI unless specifically indicated in ROS section   GEN: nad, alert and oriented HEENT: ncat NECK: supple w/o LA CV: rrr.  Murmur noted at right upper sternal border. PULM: ctab, no inc wob ABD: soft, +bs EXT: no edema SKIN: no acute rash but 0.5 x 1cm area along L spine scar that is hyperpigmented.   CN 2-12 wnl B, S/S wnl x4  At least 30 minutes were devoted to patient care in this encounter (this can potentially include time spent reviewing the patient's file/history, interviewing and examining the patient, counseling/reviewing plan with patient, ordering referrals, ordering tests, reviewing relevant laboratory or x-ray data, and documenting the encounter).

## 2020-04-30 ENCOUNTER — Encounter: Payer: Self-pay | Admitting: Family Medicine

## 2020-04-30 DIAGNOSIS — R202 Paresthesia of skin: Secondary | ICD-10-CM | POA: Insufficient documentation

## 2020-04-30 DIAGNOSIS — L989 Disorder of the skin and subcutaneous tissue, unspecified: Secondary | ICD-10-CM | POA: Insufficient documentation

## 2020-04-30 DIAGNOSIS — R011 Cardiac murmur, unspecified: Secondary | ICD-10-CM | POA: Insufficient documentation

## 2020-04-30 NOTE — Assessment & Plan Note (Signed)
Episodic left-sided V2/V3 paresthesia with altered but not absent sensation with normal exam today at office visit.  No other new neurologic symptoms.  Reasonable to check basic labs today and then consider imaging.  See notes on labs.

## 2020-04-30 NOTE — Assessment & Plan Note (Signed)
Benign appearing hyperpigmentation along the superior aspect of lower back surgery site.  No intervention needed.  Discussed with patient.

## 2020-04-30 NOTE — Assessment & Plan Note (Signed)
Will review old records.  Discussed with patient.

## 2020-05-03 NOTE — Telephone Encounter (Signed)
Please see mychart message.  Can patient get referred to ENT clinic associated with [facility]?

## 2020-05-07 ENCOUNTER — Other Ambulatory Visit: Payer: Self-pay

## 2020-05-07 ENCOUNTER — Ambulatory Visit (INDEPENDENT_AMBULATORY_CARE_PROVIDER_SITE_OTHER)
Admission: RE | Admit: 2020-05-07 | Discharge: 2020-05-07 | Disposition: A | Discharge status: Home / Self Care | Payer: Medicare Other | Source: Ambulatory Visit | Attending: Family Medicine | Admitting: Family Medicine

## 2020-05-07 DIAGNOSIS — R202 Paresthesia of skin: Secondary | ICD-10-CM

## 2020-05-09 ENCOUNTER — Other Ambulatory Visit: Payer: Self-pay | Admitting: Family Medicine

## 2020-05-09 DIAGNOSIS — R202 Paresthesia of skin: Secondary | ICD-10-CM

## 2020-05-10 ENCOUNTER — Encounter: Payer: Self-pay | Admitting: Neurology

## 2020-05-12 ENCOUNTER — Encounter: Payer: Self-pay | Admitting: Family Medicine

## 2020-05-13 NOTE — Telephone Encounter (Signed)
Last filled 01-22-20 #60 Last OV 04-26-20 No Future OV  Pharmacy not mentioned in message

## 2020-05-14 ENCOUNTER — Other Ambulatory Visit: Payer: Self-pay

## 2020-05-14 MED ORDER — ALPRAZOLAM 0.5 MG PO TABS
0.5000 mg | ORAL_TABLET | Freq: Two times a day (BID) | ORAL | 0 refills | Status: DC | PRN
Start: 2020-05-14 — End: 2020-06-11

## 2020-05-14 NOTE — Telephone Encounter (Signed)
Pt lvm stating she is a NP of Dr. Josefine Class in Sept.  Requesting refill for alprazolam.  She can be reached at [phone] with any questions.  Name of Medication: Alprazolam Name of Pharmacy: [facility] Last Fill or Written Date and Quantity: 01/22/16, #60 Last Office Visit and Type: 04/26/20, facial numbness Next Office Visit and Type: none Last Controlled Substance Agreement Date: none Last UDS: none

## 2020-05-14 NOTE — Telephone Encounter (Signed)
Sent. Thanks.   

## 2020-06-04 ENCOUNTER — Other Ambulatory Visit: Payer: Self-pay

## 2020-06-04 ENCOUNTER — Encounter (INDEPENDENT_AMBULATORY_CARE_PROVIDER_SITE_OTHER): Payer: Self-pay | Admitting: Otolaryngology

## 2020-06-04 ENCOUNTER — Ambulatory Visit (INDEPENDENT_AMBULATORY_CARE_PROVIDER_SITE_OTHER): Payer: Medicare Other | Admitting: Otolaryngology

## 2020-06-04 VITALS — Temp 97.5°F

## 2020-06-04 DIAGNOSIS — H6123 Impacted cerumen, bilateral: Secondary | ICD-10-CM

## 2020-06-04 DIAGNOSIS — R42 Dizziness and giddiness: Secondary | ICD-10-CM | POA: Diagnosis not present

## 2020-06-04 NOTE — Progress Notes (Signed)
HPI: Michelle Park is a 69 y.o. female who presents is referred by her PCP for evaluation of vertigo. Patient states that she has been having "dizzy spells" for a couple of years.  She was diagnosed with vertigo by Dr Damita Dunnings and instructed on the Epley maneuver which seemed to help some.  She apparently had an episode while at a restaurant last week where she had dizziness that lasted for about 10 minutes.  She felt swimmy headed although visually did not really noticed things spinning. She does not have her dizzy episodes at night while in bed.  She has not noted any hearing problems although she can occasionally hear her heartbeat in the left ear in the mornings.  She also has occasional sensation of water in the left ear. She is also scheduled to see neurology concerning her dizziness..  Past Medical History:  Diagnosis Date   Allergic rhinitis due to pollen    Anxiety    Bronchitis    hx of   Chronic back pain    COPD (chronic obstructive pulmonary disease) (HCC)    asymptomatic at preop 03-25-19 sees pcp Donnie Coffin   Depression    Dyspnea    with activity only   Episodic mood disorder (HCC)    Fibromyalgia    GERD (gastroesophageal reflux disease)    Headache    frequent   Heart murmur    Hyperlipidemia    Hypertension    Restless leg syndrome    Past Surgical History:  Procedure Laterality Date   BACK SURGERY     2019 L spine Dr. 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   1 daughter   Social Determinants of Radio broadcast assistant Strain:    Difficulty of Paying Living Expenses: Not on file  Food Insecurity:    Worried About Charity fundraiser in the Last Year: Not on file   [employer] of Food in the Last Year: Not on file  Transportation Needs:    Lack of Transportation (Medical): Not on file   Lack of Transportation (Non-Medical): Not on file  Physical Activity:    Days of  Exercise per Week: Not on file   Minutes of Exercise per Session: Not on file  Stress:    Feeling of Stress : Not on file  Social Connections:    Frequency of Communication with Friends and Family: Not on file   Frequency of Social Gatherings with Friends and Family: Not on file   Attends Religious Services: Not on file   Active Member of Clubs or Organizations: Not on file   Attends Archivist Meetings: Not on file   Marital Status: Not on file   Family History  Problem Relation Age of Onset   Heart disease Mother    Alzheimer's disease Mother    Cancer Sister    Colon cancer Neg Hx    Esophageal cancer Neg Hx    Inflammatory bowel disease Neg Hx    Liver disease Neg Hx    Pancreatic cancer Neg Hx    Rectal cancer Neg Hx    Stomach cancer Neg Hx    Allergies  Allergen Reactions   Aspirin Other (See Comments)    Indigestion/throat burns    Atorvastatin     REACTION: causes leg pain   Rosuvastatin Other (See Comments)    Myalgias   Prior to Admission medications   Medication Sig Start Date End Date Taking? Authorizing Provider  ALPRAZolam Duanne Moron) 0.5 MG tablet Take 1 tablet (0.5 mg total) by mouth 2 (two) times daily as needed. 05/14/20   Tonia Ghent, MD  calcium-vitamin D (OSCAL WITH D) 500-200 MG-UNIT per tablet Take 1 tablet by mouth 3 (three) times daily with meals. 1500 mg per day for low bone densit    [provider]  Cholecalciferol (VITAMIN D) 125 MCG (5000 UT) CAPS Take by mouth daily.    [provider]  fenofibrate 160 MG tablet Take 160 mg by mouth daily after supper.     [provider]  losartan (COZAAR) 100 MG tablet Take 100 mg by mouth daily.  09/22/18   [provider]  Multiple Vitamin (MULTIVITAMIN WITH MINERALS) TABS tablet Take 1 tablet by mouth daily after lunch. Centrum    [provider]  omeprazole (PRILOSEC) 40 MG capsule Take 1 capsule (40 mg total) by mouth daily before breakfast. 03/27/20   Tonia Ghent, MD  Vitamin D-Vitamin K (VITAMIN K2-VITAMIN D3) 45-2000 MCG-UNIT CAPS Take by mouth daily.    [provider]  Zinc 15 MG CAPS Take by mouth daily.    [provider]     Positive ROS: Otherwise negative  All other systems have been reviewed and were otherwise negative with the exception of those mentioned in the HPI and as above.  Physical Exam: Constitutional: Alert, well-appearing, no acute distress Ears: External ears without lesions or tenderness.  She had a large amount of wax in the left ear canal that was partially obstructing the ear canal and was close to the TM that was removed with curette.  Right ear canal had minimal cerumen that was cleaned with a curette.  After cleaning the ear canals the TMs were clear bilaterally with good mobility on pneumatic otoscopy.  Hearing screening with the 512 1024 tuning fork  revealed good hearing in both ears which was symmetric.  Dix-Hallpike testing revealed no evidence of BPPV. Nasal: External nose without lesions. Septum minimal deformity and mild rhinitis.  Both middle meatus regions were clear with no signs of infection..  Oral: Lips and gums without lesions. Tongue and palate mucosa without lesions. Posterior  oropharynx clear. Neck: No palpable adenopathy or masses Respiratory: Breathing comfortably  Skin: No facial/neck lesions or rash noted.  Cerumen impaction removal  Date/Time: 06/04/2020 5:07 PM Performed by: Rozetta Nunnery, MD Authorized by: Rozetta Nunnery, MD   Consent:    Consent obtained:  Verbal   Consent given by:  Patient   Risks discussed:  Pain and bleeding Procedure details:    Location:  L ear and R ear   Procedure type: curette   Post-procedure details:    Inspection:  TM intact and canal normal   Hearing quality:  Improved   Patient tolerance of procedure:  Tolerated well, no immediate complications Comments:     After cleaning the wax from the ear canals left side worse than right TMs were clear bilaterally.    Assessment: Dizziness questionable etiology.  No clinical evidence of BPPV on testing in the office today. Wax buildup left side worse than right with clear TMs otherwise and normal hearing  Plan: I discussed with her that her ears were clear bilaterally after cleaning the wax and that on exam in the office today she has no evidence of BPPV. Agree with further evaluation with neurologist.   Radene Journey, MD   CC:

## 2020-06-08 ENCOUNTER — Encounter: Payer: Self-pay | Admitting: Family Medicine

## 2020-06-10 ENCOUNTER — Encounter: Payer: Self-pay | Admitting: Family Medicine

## 2020-06-11 ENCOUNTER — Other Ambulatory Visit: Payer: Self-pay | Admitting: Family Medicine

## 2020-06-11 ENCOUNTER — Other Ambulatory Visit: Payer: Self-pay

## 2020-06-11 NOTE — Telephone Encounter (Signed)
Last office visit 04/26/2020 for paresthesia.  Last refilled 05/14/2020 for #60 with no refills.  No future appointments.

## 2020-06-11 NOTE — Telephone Encounter (Signed)
Patient calling for refill on pending medications. Last office visit 04/05/20. Please advise.

## 2020-06-12 MED ORDER — ALPRAZOLAM 0.5 MG PO TABS
0.5000 mg | ORAL_TABLET | Freq: Two times a day (BID) | ORAL | 1 refills | Status: DC | PRN
Start: 2020-06-12 — End: 2020-08-07

## 2020-06-12 MED ORDER — FENOFIBRATE 160 MG PO TABS
160.0000 mg | ORAL_TABLET | Freq: Every day | ORAL | 1 refills | Status: DC
Start: 2020-06-12 — End: 2021-04-30

## 2020-06-12 MED ORDER — LOSARTAN POTASSIUM 100 MG PO TABS
100.0000 mg | ORAL_TABLET | Freq: Every day | ORAL | 1 refills | Status: DC
Start: 2020-06-12 — End: 2020-12-10

## 2020-06-12 NOTE — Telephone Encounter (Signed)
Sent. Thanks.   

## 2020-06-22 ENCOUNTER — Encounter: Payer: Self-pay | Admitting: Family Medicine

## 2020-06-22 MED ORDER — OMEPRAZOLE 40 MG PO CPDR: 40.0000 mg | DELAYED_RELEASE_CAPSULE | Freq: Every day | ORAL | 1 refills | Status: DC

## 2020-07-05 ENCOUNTER — Encounter: Payer: Self-pay | Admitting: Family Medicine

## 2020-07-05 ENCOUNTER — Ambulatory Visit (INDEPENDENT_AMBULATORY_CARE_PROVIDER_SITE_OTHER): Payer: Medicare Other | Admitting: Family Medicine

## 2020-07-05 ENCOUNTER — Other Ambulatory Visit: Payer: Self-pay

## 2020-07-05 DIAGNOSIS — M79606 Pain in leg, unspecified: Secondary | ICD-10-CM

## 2020-07-05 NOTE — Patient Instructions (Signed)
Likely a hamstring strain.  Gently stretch in the AM prior to getting out of bed and then as needed.  Ice for 5 minutes at a time.  Update me as needed.   Take care.  Glad to see you.

## 2020-07-05 NOTE — Progress Notes (Signed)
This visit occurred during the SARS-CoV-2 public health emergency.  Safety protocols were in place, including screening questions prior to the visit, additional usage of staff PPE, and extensive cleaning of exam room while observing appropriate contact time as indicated for disinfecting solutions.  L leg pain.  Going on for about 2 weeks.  L buttock down the L posterior thigh.  Doesn't radiate past the knee.  She been doing some lifting the meantime, she has a heavy vacuum and she thought that irritated it.  No R sided pain.  No FCNAVD.  No trauma, no falls.  Normal L foot ROM.  No B/B sx.   Meds, vitals, and allergies reviewed.   ROS: Per HPI unless specifically indicated in ROS section   nad ncat Neck supple, no LA Rrr, soft SEM noted.  ctab Back not tender to palpation in the midline. Able to bear weight. L lateral hamstring ttp.  She does not have a true left straight leg raise that is positive during the exam.  She does not have pain radiating down the leg to the ankle.  When she has her leg extended in front of her doing straight leg raise she does not have pain but when she then moves the leg to engage the hamstring she has tenderness along the lateral hamstring.  Quad not tender to palpation.

## 2020-07-08 DIAGNOSIS — M79606 Pain in leg, unspecified: Secondary | ICD-10-CM | POA: Insufficient documentation

## 2020-07-08 NOTE — Assessment & Plan Note (Signed)
Discussed options of management Likely a hamstring strain.  Gently stretch in the AM prior to getting out of bed and then as needed.  Ice for 5 minutes at a time.  Update me as needed.

## 2020-07-11 NOTE — Progress Notes (Deleted)
NEUROLOGY CONSULTATION NOTE  Michelle Park MRN: [id number] DOB: May 06, 1951  Referring provider: Elveria Rising. Damita Dunnings, MD Primary care provider: Elveria Rising. Damita Dunnings, MD  Reason for consult:  paresthesias   Subjective:  Michelle Park. Jamie is a 69 year old ***-handed female with COPD and HTN who presents for paresthesias.  History supplemented by referring provider's notes.  She began experiencing tingling  On the left side of her face ***.  She has remote history of left-sided Bell's palsy but no facial weakness or actual facial numbness.  Denied rash.  ***.  Labs in September included B12 355 and TSH 2.62.  CBC unremarkable except for borderline elevated PLT of 403 and CMP unremarkable except for mildly reduced GFR 54.  CT head without contrast on 05/07/2020 personally reviewed was normal.       PAST MEDICAL HISTORY: Past Medical History:  Diagnosis Date  . Allergic rhinitis due to pollen   . Anxiety   . Bronchitis    hx of  . Chronic back pain   . COPD (chronic obstructive pulmonary disease) ([place])    asymptomatic at preop 03-25-19 sees pcp Donnie Coffin  . Depression   . Dyspnea    with activity only  . Episodic mood disorder ([place])   . Fibromyalgia   . GERD (gastroesophageal reflux disease)   . Headache    frequent  . Heart murmur   . Hyperlipidemia   . Hypertension   . Restless leg syndrome     PAST SURGICAL HISTORY: Past Surgical History:  Procedure Laterality Date  . BACK SURGERY     2019 L spine Dr. Saintclair Halsted  . ESOPHAGOGASTRODUODENOSCOPY    . EYE SURGERY     cataract bil  . FOOT SURGERY  1979 and 1988  . ingrown toenail     bil great toes   surgery  . ROBOTIC ADRENALECTOMY Left 04/01/2019   Procedure: XI ROBOTIC LEFT ADRENALECTOMY;  Surgeon: Ralene Ok, MD;  Location: [facility];  Service: General;  Laterality: Left;  . TONSILLECTOMY      MEDICATIONS: Current Outpatient Medications on File Prior to Visit  Medication Sig Dispense Refill  . ALPRAZolam (XANAX) 0.5 MG  tablet Take 1 tablet (0.5 mg total) by mouth 2 (two) times daily as needed. 60 tablet 1  . calcium-vitamin D (OSCAL WITH D) 500-200 MG-UNIT per tablet Take 1 tablet by mouth 3 (three) times daily with meals. 1500 mg per day for low bone densit    . Cholecalciferol (VITAMIN D) 125 MCG (5000 UT) CAPS Take by mouth daily.    . fenofibrate 160 MG tablet Take 1 tablet (160 mg total) by mouth daily after supper. 90 tablet 1  . losartan (COZAAR) 100 MG tablet Take 1 tablet (100 mg total) by mouth daily. 90 tablet 1  . Multiple Vitamin (MULTIVITAMIN WITH MINERALS) TABS tablet Take 1 tablet by mouth daily after lunch. Centrum    . omeprazole (PRILOSEC) 40 MG capsule Take 1 capsule (40 mg total) by mouth daily before breakfast. 90 capsule 1  . Vitamin D-Vitamin K (VITAMIN K2-VITAMIN D3) 45-2000 MCG-UNIT CAPS Take by mouth daily.    . Zinc 15 MG CAPS Take by mouth daily.     No current facility-administered medications on file prior to visit.    ALLERGIES: Allergies  Allergen Reactions  . Aspirin Other (See Comments)    Indigestion/throat burns  . Atorvastatin     REACTION: causes leg pain  . Rosuvastatin Other (See Comments)    Myalgias  FAMILY HISTORY: Family History  Problem Relation Age of Onset  . Heart disease Mother   . Alzheimer's disease Mother   . Cancer Sister   . Colon cancer Neg Hx   . Esophageal cancer Neg Hx   . Inflammatory bowel disease Neg Hx   . Liver disease Neg Hx   . Pancreatic cancer Neg Hx   . Rectal cancer Neg Hx   . Stomach cancer Neg Hx    ***.  SOCIAL HISTORY: Social History   Socioeconomic History  . Marital status: Divorced    Spouse name: Not on file  . Number of children: 1  . Years of education: Not on file  . Highest education level: Not on file  Occupational History  . 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 Smoking status: Current Every Day Smoker    Packs/day: 1.00    Years: 51.00    Pack years: 51.00     Types: Cigarettes    Start date: 52  . Smokeless tobacco: Never Used  Vaping Use  . Vaping Use: Never used  Substance and Sexual Activity  . Alcohol use: Not Currently    Alcohol/week: 0.0 standard drinks    Comment: rare glass of wine  . Drug use: No  . Sexual activity: Not Currently  Other Topics Concern  . Not on file  Social History Narrative   Goes by [employer].   1 daughter   Social Determinants of Health   Financial Resource Strain:   . Difficulty of Paying Living Expenses: Not on file  Food Insecurity:   . Worried About Charity fundraiser in the Last Year: Not on file  . Ran Out of Food in the Last Year: Not on file  Transportation Needs:   . Lack of Transportation (Medical): Not on file  . Lack of Transportation (Non-Medical): Not on file  Physical Activity:   . Days of Exercise per Week: Not on file  . Minutes of Exercise per Session: Not on file  Stress:   . Feeling of Stress : Not on file  Social Connections:   . Frequency of Communication with Friends and Family: Not on file  . Frequency of Social Gatherings with Friends and Family: Not on file  . Attends Religious Services: Not on file  . Active Member of Clubs or Organizations: Not on file  . Attends Archivist Meetings: Not on file  . Marital Status: Not on file  Intimate Partner Violence:   . Fear of Current or Ex-Partner: Not on file  . Emotionally Abused: Not on file  . Physically Abused: Not on file  . Sexually Abused: Not on file    Objective:  *** General: No acute distress.  Patient appears well-groomed.   Head:  Normocephalic/atraumatic Eyes:  fundi examined but not visualized Neck: supple, no paraspinal tenderness, full range of motion Back: No paraspinal tenderness Heart: regular rate and rhythm Lungs: Clear to auscultation bilaterally. Vascular: No carotid bruits. Neurological Exam: Mental status: alert and oriented to person, place, and time, recent and remote memory  intact, fund of knowledge intact, attention and concentration intact, speech fluent and not dysarthric, language intact. Cranial nerves: CN I: not tested CN II: pupils equal, round and reactive to light, visual fields intact CN III, IV, VI:  full range of motion, no nystagmus, no ptosis CN V: facial sensation intact. CN VII: upper and lower face symmetric CN VIII: hearing intact CN IX, X:  gag intact, uvula midline CN XI: sternocleidomastoid and trapezius muscles intact CN XII: tongue midline Bulk & Tone: normal, no fasciculations. Motor:  muscle strength 5/5 throughout Sensation:  Pinprick, temperature and vibratory sensation intact. Deep Tendon Reflexes:  2+ throughout,  toes downgoing.   Finger to nose testing:  Without dysmetria.   Heel to shin:  Without dysmetria.   Gait:  Normal station and stride.  Romberg negative.  Assessment/Plan:   ***    Thank you for allowing me to take part in the care of this patient.  Metta Clines, DO  CC: ***

## 2020-07-12 ENCOUNTER — Ambulatory Visit: Payer: Medicare Other | Admitting: Neurology

## 2020-08-06 ENCOUNTER — Other Ambulatory Visit: Payer: Self-pay | Admitting: Family Medicine

## 2020-08-06 ENCOUNTER — Telehealth: Payer: Self-pay

## 2020-08-06 NOTE — Telephone Encounter (Signed)
Spoke with Hetty Ely and she stated that she would fax the paper over to [facility] for the pts record.  Thank you,  Christy Gentles

## 2020-08-06 NOTE — Telephone Encounter (Signed)
Please Advise

## 2020-08-07 NOTE — Telephone Encounter (Signed)
Sent. Thanks.   

## 2020-08-08 NOTE — Telephone Encounter (Signed)
Medical record request faxed to [facility]. Awaiting records to be faxed.

## 2020-08-08 NOTE — Telephone Encounter (Signed)
Thanks

## 2020-08-22 ENCOUNTER — Other Ambulatory Visit: Payer: Medicare Other

## 2020-08-24 ENCOUNTER — Other Ambulatory Visit: Payer: Self-pay | Admitting: Neurosurgery

## 2020-08-24 DIAGNOSIS — M5440 Lumbago with sciatica, unspecified side: Secondary | ICD-10-CM

## 2020-08-29 ENCOUNTER — Other Ambulatory Visit (INDEPENDENT_AMBULATORY_CARE_PROVIDER_SITE_OTHER): Payer: Medicare Other

## 2020-08-29 ENCOUNTER — Other Ambulatory Visit: Payer: Self-pay

## 2020-08-29 DIAGNOSIS — E278 Other specified disorders of adrenal gland: Secondary | ICD-10-CM

## 2020-08-29 DIAGNOSIS — E270 Other adrenocortical overactivity: Secondary | ICD-10-CM | POA: Diagnosis not present

## 2020-08-29 LAB — CORTISOL: Cortisol, Plasma: 11.1 ug/dL

## 2020-08-31 ENCOUNTER — Encounter: Payer: Self-pay | Admitting: Internal Medicine

## 2020-09-03 LAB — ACTH: C206 ACTH: 42 pg/mL (ref 6–50)

## 2020-09-10 ENCOUNTER — Ambulatory Visit
Admission: RE | Admit: 2020-09-10 | Discharge: 2020-09-10 | Disposition: A | Discharge status: Home / Self Care | Payer: Medicare Other | Source: Ambulatory Visit | Attending: Neurosurgery | Admitting: Neurosurgery

## 2020-09-10 ENCOUNTER — Other Ambulatory Visit: Payer: Self-pay

## 2020-09-10 DIAGNOSIS — M5440 Lumbago with sciatica, unspecified side: Secondary | ICD-10-CM

## 2020-09-10 MED ORDER — GADOBENATE DIMEGLUMINE 529 MG/ML IV SOLN
19.0000 mL | Freq: Once | INTRAVENOUS | Status: AC | PRN
  Administered 2020-09-10: 19 mL via INTRAVENOUS

## 2020-10-01 ENCOUNTER — Other Ambulatory Visit: Payer: Self-pay | Admitting: Family Medicine

## 2020-10-01 ENCOUNTER — Telehealth: Payer: Self-pay

## 2020-10-01 NOTE — Telephone Encounter (Signed)
Refill request Alprazolam Last refill 08/07/20 #60/1 Last office visit 07/05/20

## 2020-10-01 NOTE — Telephone Encounter (Signed)
Refill request for Alprazolam 0.5 mg tablets  LOV - 07/05/20 Next OV - not scheduled Last refilled - 08/07/20 #60/1

## 2020-10-02 ENCOUNTER — Encounter: Payer: Self-pay | Admitting: Family Medicine

## 2020-10-02 DIAGNOSIS — M5440 Lumbago with sciatica, unspecified side: Secondary | ICD-10-CM | POA: Diagnosis not present

## 2020-10-02 NOTE — Telephone Encounter (Signed)
Sent. Thanks.   

## 2020-10-02 NOTE — Telephone Encounter (Signed)
Sent. Thanks.  See refill request.

## 2020-10-04 DIAGNOSIS — M5440 Lumbago with sciatica, unspecified side: Secondary | ICD-10-CM | POA: Diagnosis not present

## 2020-10-09 DIAGNOSIS — M5440 Lumbago with sciatica, unspecified side: Secondary | ICD-10-CM | POA: Diagnosis not present

## 2020-10-11 DIAGNOSIS — M5440 Lumbago with sciatica, unspecified side: Secondary | ICD-10-CM | POA: Diagnosis not present

## 2020-10-22 DIAGNOSIS — M79605 Pain in left leg: Secondary | ICD-10-CM | POA: Diagnosis not present

## 2020-10-22 DIAGNOSIS — M79604 Pain in right leg: Secondary | ICD-10-CM | POA: Diagnosis not present

## 2020-10-22 DIAGNOSIS — M5451 Vertebrogenic low back pain: Secondary | ICD-10-CM | POA: Diagnosis not present

## 2020-10-24 DIAGNOSIS — M79605 Pain in left leg: Secondary | ICD-10-CM | POA: Diagnosis not present

## 2020-10-24 DIAGNOSIS — M5451 Vertebrogenic low back pain: Secondary | ICD-10-CM | POA: Diagnosis not present

## 2020-10-24 DIAGNOSIS — M79604 Pain in right leg: Secondary | ICD-10-CM | POA: Diagnosis not present

## 2020-10-29 DIAGNOSIS — M79605 Pain in left leg: Secondary | ICD-10-CM | POA: Diagnosis not present

## 2020-10-29 DIAGNOSIS — M79604 Pain in right leg: Secondary | ICD-10-CM | POA: Diagnosis not present

## 2020-10-29 DIAGNOSIS — M5451 Vertebrogenic low back pain: Secondary | ICD-10-CM | POA: Diagnosis not present

## 2020-10-31 DIAGNOSIS — M79604 Pain in right leg: Secondary | ICD-10-CM | POA: Diagnosis not present

## 2020-10-31 DIAGNOSIS — M5451 Vertebrogenic low back pain: Secondary | ICD-10-CM | POA: Diagnosis not present

## 2020-10-31 DIAGNOSIS — M79605 Pain in left leg: Secondary | ICD-10-CM | POA: Diagnosis not present

## 2020-11-05 DIAGNOSIS — M5451 Vertebrogenic low back pain: Secondary | ICD-10-CM | POA: Diagnosis not present

## 2020-11-05 DIAGNOSIS — M79605 Pain in left leg: Secondary | ICD-10-CM | POA: Diagnosis not present

## 2020-11-05 DIAGNOSIS — M79604 Pain in right leg: Secondary | ICD-10-CM | POA: Diagnosis not present

## 2020-11-07 DIAGNOSIS — M79605 Pain in left leg: Secondary | ICD-10-CM | POA: Diagnosis not present

## 2020-11-07 DIAGNOSIS — M5451 Vertebrogenic low back pain: Secondary | ICD-10-CM | POA: Diagnosis not present

## 2020-11-07 DIAGNOSIS — M79604 Pain in right leg: Secondary | ICD-10-CM | POA: Diagnosis not present

## 2020-11-13 DIAGNOSIS — M5440 Lumbago with sciatica, unspecified side: Secondary | ICD-10-CM | POA: Diagnosis not present

## 2020-11-14 DIAGNOSIS — M5451 Vertebrogenic low back pain: Secondary | ICD-10-CM | POA: Diagnosis not present

## 2020-11-14 DIAGNOSIS — M79605 Pain in left leg: Secondary | ICD-10-CM | POA: Diagnosis not present

## 2020-11-14 DIAGNOSIS — M79604 Pain in right leg: Secondary | ICD-10-CM | POA: Diagnosis not present

## 2020-11-23 ENCOUNTER — Other Ambulatory Visit: Payer: Self-pay | Admitting: Family Medicine

## 2020-11-23 DIAGNOSIS — R55 Syncope and collapse: Secondary | ICD-10-CM | POA: Diagnosis not present

## 2020-11-23 NOTE — Telephone Encounter (Signed)
Refill request for Alprazolam 0.5 mg tablets  LOV - 07/05/20 Next OV - 11/29/20 Last refill - 10/02/20 #60/1

## 2020-11-24 ENCOUNTER — Ambulatory Visit (HOSPITAL_COMMUNITY)
Admission: EM | Admit: 2020-11-24 | Discharge: 2020-11-24 | Disposition: A | Discharge status: Home / Self Care | Payer: Medicare Other

## 2020-11-24 ENCOUNTER — Emergency Department (HOSPITAL_COMMUNITY)
Admission: EM | Admit: 2020-11-24 | Discharge: 2020-11-24 | Disposition: A | Discharge status: Home / Self Care | Payer: Medicare Other | Attending: Emergency Medicine | Admitting: Emergency Medicine

## 2020-11-24 ENCOUNTER — Emergency Department (HOSPITAL_COMMUNITY): Payer: Medicare Other

## 2020-11-24 ENCOUNTER — Other Ambulatory Visit: Payer: Self-pay

## 2020-11-24 ENCOUNTER — Encounter (HOSPITAL_COMMUNITY): Payer: Self-pay | Admitting: Emergency Medicine

## 2020-11-24 DIAGNOSIS — S8991XA Unspecified injury of right lower leg, initial encounter: Secondary | ICD-10-CM | POA: Diagnosis present

## 2020-11-24 DIAGNOSIS — R42 Dizziness and giddiness: Secondary | ICD-10-CM | POA: Diagnosis not present

## 2020-11-24 DIAGNOSIS — S81831A Puncture wound without foreign body, right lower leg, initial encounter: Secondary | ICD-10-CM | POA: Insufficient documentation

## 2020-11-24 DIAGNOSIS — I10 Essential (primary) hypertension: Secondary | ICD-10-CM | POA: Insufficient documentation

## 2020-11-24 DIAGNOSIS — W540XXA Bitten by dog, initial encounter: Secondary | ICD-10-CM | POA: Diagnosis not present

## 2020-11-24 DIAGNOSIS — S8012XA Contusion of left lower leg, initial encounter: Secondary | ICD-10-CM | POA: Insufficient documentation

## 2020-11-24 DIAGNOSIS — J449 Chronic obstructive pulmonary disease, unspecified: Secondary | ICD-10-CM | POA: Insufficient documentation

## 2020-11-24 DIAGNOSIS — R55 Syncope and collapse: Secondary | ICD-10-CM | POA: Diagnosis not present

## 2020-11-24 DIAGNOSIS — S81851A Open bite, right lower leg, initial encounter: Secondary | ICD-10-CM | POA: Diagnosis not present

## 2020-11-24 DIAGNOSIS — Z79899 Other long term (current) drug therapy: Secondary | ICD-10-CM | POA: Diagnosis not present

## 2020-11-24 DIAGNOSIS — Z23 Encounter for immunization: Secondary | ICD-10-CM | POA: Insufficient documentation

## 2020-11-24 DIAGNOSIS — R0602 Shortness of breath: Secondary | ICD-10-CM | POA: Diagnosis not present

## 2020-11-24 DIAGNOSIS — F17210 Nicotine dependence, cigarettes, uncomplicated: Secondary | ICD-10-CM | POA: Insufficient documentation

## 2020-11-24 HISTORY — DX: Spinal stenosis, site unspecified: M48.00

## 2020-11-24 LAB — URINALYSIS, ROUTINE W REFLEX MICROSCOPIC
Bilirubin Urine: NEGATIVE
Glucose, UA: NEGATIVE mg/dL
Hgb urine dipstick: NEGATIVE
Ketones, ur: NEGATIVE mg/dL
Leukocytes,Ua: NEGATIVE
Nitrite: NEGATIVE
Protein, ur: NEGATIVE mg/dL
Specific Gravity, Urine: 1.006 (ref 1.005–1.030)
pH: 6 (ref 5.0–8.0)

## 2020-11-24 LAB — TROPONIN I (HIGH SENSITIVITY)
Troponin I (High Sensitivity): 5 ng/L (ref <18)
Troponin I (High Sensitivity): 7 ng/L (ref <18)

## 2020-11-24 LAB — BASIC METABOLIC PANEL
Anion gap: 10 (ref 5–15)
BUN: 29 mg/dL — ABNORMAL HIGH (ref 8–23)
CO2: 19 mmol/L — ABNORMAL LOW (ref 22–32)
Calcium: 10.1 mg/dL (ref 8.9–10.3)
Chloride: 104 mmol/L (ref 98–111)
Creatinine, Ser: 0.98 mg/dL (ref 0.44–1.00)
GFR, Estimated: >60 mL/min (ref >60)
Glucose, Bld: 111 mg/dL — ABNORMAL HIGH (ref 70–99)
Potassium: 4.4 mmol/L (ref 3.5–5.1)
Sodium: 133 mmol/L — ABNORMAL LOW (ref 135–145)

## 2020-11-24 LAB — CBC WITH DIFFERENTIAL/PLATELET
Abs Immature Granulocytes: 0.03 10*3/uL (ref 0.00–0.07)
Basophils Absolute: 0.1 10*3/uL (ref 0.0–0.1)
Basophils Relative: 1 %
Eosinophils Absolute: 0.1 10*3/uL (ref 0.0–0.5)
Eosinophils Relative: 1 %
HCT: 40.4 % (ref 36.0–46.0)
Hemoglobin: 12.9 g/dL (ref 12.0–15.0)
Immature Granulocytes: 0 %
Lymphocytes Relative: 25 %
Lymphs Abs: 2.4 10*3/uL (ref 0.7–4.0)
MCH: 29.5 pg (ref 26.0–34.0)
MCHC: 31.9 g/dL (ref 30.0–36.0)
MCV: 92.4 fL (ref 80.0–100.0)
Monocytes Absolute: 0.8 10*3/uL (ref 0.1–1.0)
Monocytes Relative: 8 %
Neutro Abs: 6.3 10*3/uL (ref 1.7–7.7)
Neutrophils Relative %: 65 %
Platelets: 399 10*3/uL (ref 150–400)
RBC: 4.37 MIL/uL (ref 3.87–5.11)
RDW: 14.2 % (ref 11.5–15.5)
WBC: 9.6 10*3/uL (ref 4.0–10.5)
nRBC: 0 % (ref 0.0–0.2)

## 2020-11-24 LAB — D-DIMER, QUANTITATIVE: D-Dimer, Quant: 0.61 ug/mL-FEU — ABNORMAL HIGH (ref 0.00–0.50)

## 2020-11-24 MED ORDER — TETANUS-DIPHTH-ACELL PERTUSSIS 5-2.5-18.5 LF-MCG/0.5 IM SUSY
0.5000 mL | PREFILLED_SYRINGE | Freq: Once | INTRAMUSCULAR | Status: AC
  Administered 2020-11-24: 0.5 mL via INTRAMUSCULAR
  Filled 2020-11-24: qty 0.5

## 2020-11-24 MED ORDER — RABIES VACCINE, PCEC IM SUSR
1.0000 mL | Freq: Once | INTRAMUSCULAR | Status: DC
  Filled 2020-11-24: qty 1

## 2020-11-24 MED ORDER — RABIES IMMUNE GLOBULIN 150 UNIT/ML IM INJ
1800.0000 [IU] | INJECTION | Freq: Once | INTRAMUSCULAR | Status: DC
  Filled 2020-11-24: qty 20

## 2020-11-24 MED ORDER — SODIUM CHLORIDE 0.9 % IV BOLUS
1000.0000 mL | Freq: Once | INTRAVENOUS | Status: AC
  Administered 2020-11-24: 1000 mL via INTRAVENOUS

## 2020-11-24 MED ORDER — AMOXICILLIN-POT CLAVULANATE 875-125 MG PO TABS: 1.0000 | ORAL_TABLET | Freq: Two times a day (BID) | ORAL | 0 refills | Status: AC

## 2020-11-24 MED ORDER — AMOXICILLIN-POT CLAVULANATE 875-125 MG PO TABS
1.0000 | ORAL_TABLET | Freq: Once | ORAL | Status: AC
  Administered 2020-11-24: 1 via ORAL
  Filled 2020-11-24: qty 1

## 2020-11-24 NOTE — Discharge Instructions (Addendum)
Please pick up antibiotic medication and take as prescribed to cover for infection. You have ultimately decided to not go forward with the rabies injection/vaccine today. If you change your mind you can return to the ED at anytime for this.   Please follow up with both your cardiologist and your PCP regarding your ED visit and your lightheaded/possible loss of consciousness episode that occurred yesterday.

## 2020-11-24 NOTE — ED Triage Notes (Signed)
Pt states she was bit on the back of lower legs by a dog yesterday.  Puncture wound to posterior R lower leg and small puncture wound to posterior L lower leg.  She has been unable to confirm UTD vaccinations with owner.

## 2020-11-24 NOTE — ED Provider Notes (Signed)
[facility] EMERGENCY DEPARTMENT Provider Note   CSN: [id number] Arrival date & time: 11/24/20  1152     History No chief complaint on file.   Michelle Park is a 70 y.o. female with PMHx HTN, HLD, GERD, Fibromyalgia, COPD, who presents to the ED today for animal bite.  Patient reports she was at a friend's house yesterday.  Friends granddaughter whom she lives with had her large [place] there.  Patient reports that the [place] bit her on both her left and her right calf.  She sustained a puncture wound to the right calf from the dog.  She was told from her friend that the dog is up-to-date on all vaccines however when she asked for proof of vaccination they were unable to provide documentation.  She states she has called them several times and gone to their home and nobody is answering the door prompting her to come to the ED today.  Patient also mentions that last night several hours after the incident she was at home eating pizza for dinner.  She states that before/during eating the pieces she felt slightly short of breath.  She is unsure if she ate too much pizza. She states after eating the pizza she began feeling more short of breath as well as dizzy.  She states that her daughter was in the other room and heard a snoring sensation.  Patient states that she believes she may have passed out and came to quickly.  EMS was called and they came to evaluate patient and did an EKG and told her that everything looked okay however recommended that she come to the ED for further evaluation as she was still feeling slightly short of breath.  She decided not to come yesterday.  She denies any shortness of breath, chest pain, dizziness at this time.  She does mention she had an an echocardiogram done recently which did show some aortic stenosis.   The history is provided by the patient and medical records.       Past Medical History:  Diagnosis Date  . Allergic rhinitis  due to pollen   . Anxiety   . Bronchitis    hx of  . Chronic back pain   . COPD (chronic obstructive pulmonary disease) ([place])   . Depression   . Dyspnea    with activity only  . Episodic mood disorder ([place])   . Fibromyalgia   . GERD (gastroesophageal reflux disease)   . Headache    frequent  . Heart murmur   . Hyperlipidemia   . Hypertension   . Restless leg syndrome   . Spinal stenosis     Patient Active Problem List   Diagnosis Date Noted  . Leg pain 07/08/2020  . Facial paresthesia 04/30/2020  . Cardiac murmur 04/30/2020  . Skin lesion 04/30/2020  . BPV (benign positional vertigo) 04/01/2020  . History of Barrett's esophagus 01/15/2020  . Intestinal metaplasia of gastric mucosa 01/15/2020  . Colon cancer screening 01/15/2020  . Vertigo 01/15/2020  . Preop examination 01/15/2020  . Adrenal mass, left ([place]) 04/01/2019  . Left adrenal mass ([place]) 11/01/2018  . Abnormal CT of the abdomen 08/22/2018  . Nausea 08/22/2018  . Unintentional weight loss 08/22/2018  . Aortic sclerosis 12/17/2015  . Fibromyalgia 10/11/2015  . Left hand pain 08/01/2015  . Preventative health care 03/21/2015  . Chronic back pain   . GERD (gastroesophageal reflux disease)   . Hyperlipidemia   . Hypertension   .  Episodic mood disorder ([place])   . 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 SMOKER 05/09/2009  . COPD (chronic obstructive pulmonary disease) ([place]) 09/14/2008    Past Surgical History:  Procedure Laterality Date  . BACK SURGERY     2019 L spine Dr. Saintclair Halsted  . ESOPHAGOGASTRODUODENOSCOPY    . EYE SURGERY     cataract bil  . FOOT SURGERY  1979 and 1988  . ingrown toenail     bil great toes   surgery  . ROBOTIC ADRENALECTOMY Left 04/01/2019   Procedure: XI ROBOTIC LEFT ADRENALECTOMY;  Surgeon: Ralene Ok, MD;  Location: [facility];  Service: General;  Laterality: Left;  . TONSILLECTOMY       OB History   No obstetric history on file.     Family History  Problem Relation Age of Onset   . Heart disease Mother   . Alzheimer's disease Mother   . Cancer Sister   . Colon cancer Neg Hx   . Esophageal cancer Neg Hx   . Inflammatory bowel disease Neg Hx   . Liver disease Neg Hx   . Pancreatic cancer Neg Hx   . Rectal cancer Neg Hx   . Stomach cancer Neg Hx     Social History   Tobacco Use  . Smoking status: Current Every Day Smoker    Packs/day: 1.00    Years: 51.00    Pack years: 51.00    Types: Cigarettes    Start date: 49  . Smokeless tobacco: Never Used  Vaping Use  . Vaping Use: Never used  Substance Use Topics  . Alcohol use: Not Currently    Alcohol/week: 0.0 standard drinks    Comment: rare glass of wine  . Drug use: No    Home Medications Prior to Admission medications   Medication Sig Start Date End Date Taking? Authorizing Provider  amoxicillin-clavulanate (AUGMENTIN) 875-125 MG tablet Take 1 tablet by mouth 2 (two) times daily for 7 days. 11/24/20 12/01/20 Yes Deen Deguia, PA-C  ALPRAZolam Duanne Moron) 0.5 MG tablet Take 1 tablet by mouth twice daily as needed 10/02/20   Tonia Ghent, MD  calcium-vitamin D (OSCAL WITH D) 500-200 MG-UNIT per tablet Take 1 tablet by mouth 3 (three) times daily with meals. 1500 mg per day for low bone densit    [provider]  Cholecalciferol (VITAMIN D) 125 MCG (5000 UT) CAPS Take by mouth daily.    [provider]  fenofibrate 160 MG tablet Take 1 tablet (160 mg total) by mouth daily after supper. 06/12/20   Tonia Ghent, MD  losartan (COZAAR) 100 MG tablet Take 1 tablet (100 mg total) by mouth daily. 06/12/20   Tonia Ghent, MD  Multiple Vitamin (MULTIVITAMIN WITH MINERALS) TABS tablet Take 1 tablet by mouth daily after lunch. Centrum    [provider]  omeprazole (PRILOSEC) 40 MG capsule Take 1 capsule (40 mg total) by mouth daily before breakfast. 06/22/20   Tonia Ghent, MD  Vitamin D-Vitamin K (VITAMIN K2-VITAMIN D3) 45-2000 MCG-UNIT CAPS Take by mouth daily.    [provider]  Zinc 15 MG CAPS Take by mouth daily.    [provider]    Allergies    Aspirin, Atorvastatin, and Rosuvastatin  Review of Systems   Review of Systems  Constitutional: Negative for chills and fever.  Respiratory: Positive for shortness of breath.   Skin: Positive for wound.  Neurological: Positive for light-headedness.  All other systems reviewed and are negative.  Physical Exam Updated Vital Signs BP (!) 129/102 (BP Location: Left Arm)   Pulse 89   Temp (!) 97.5 F (36.4 C) (Oral)   Resp 18   SpO2 98%   Physical Exam Vitals and nursing note reviewed.  Constitutional:      Appearance: She is not ill-appearing.  HENT:     Head: Normocephalic and atraumatic.  Eyes:     Conjunctiva/sclera: Conjunctivae normal.  Cardiovascular:     Rate and Rhythm: Normal rate and regular rhythm.     Pulses: Normal pulses.  Pulmonary:     Effort: Pulmonary effort is normal.     Breath sounds: Normal breath sounds. No wheezing, rhonchi or rales.  Abdominal:     Palpations: Abdomen is soft.     Tenderness: There is no abdominal tenderness. There is no guarding or rebound.  Musculoskeletal:     Cervical back: Neck supple.     Comments: See photo below. Puncture wound noted to right calf with surrounding abrasions; not actively bleeding; mild TTP. 2+ DP pulse.   Abrasion with ecchymosis noted to left calf with mild TTP; no breaking of skin appreciated; 2+ DP pulse  Skin:    General: Skin is warm and dry.  Neurological:     Mental Status: She is alert.         ED Results / Procedures / Treatments   Labs (all labs ordered are listed, but only abnormal results are displayed) Labs Reviewed  BASIC METABOLIC PANEL - Abnormal; Notable for the following components:      Result Value   Sodium 133 (*)    CO2 19 (*)    Glucose, Bld 111 (*)    BUN 29 (*)    All other components within normal limits  D-DIMER, QUANTITATIVE - Abnormal; Notable for the following  components:   D-Dimer, Quant 0.61 (*)    All other components within normal limits  CBC WITH DIFFERENTIAL/PLATELET  URINALYSIS, ROUTINE W REFLEX MICROSCOPIC  TROPONIN I (HIGH SENSITIVITY)  TROPONIN I (HIGH SENSITIVITY)    EKG EKG Interpretation  Date/Time:  Saturday November 24 2020 13:09:14 EDT Ventricular Rate:  83 PR Interval:  192 QRS Duration: 102 QT Interval:  366 QTC Calculation: 430 R Axis:   56 Text Interpretation: Sinus rhythm Probable left atrial enlargement  similar to 2019 Confirmed by Sherwood Gambler [id number]) on 11/24/2020 1:19:15 PM   Radiology DG Chest 2 View  Result Date: 11/24/2020 CLINICAL DATA:  Dog bite to right calf Shortness of breath EXAM: CHEST - 2 VIEW; RIGHT TIBIA AND FIBULA - 2 VIEW COMPARISON:  None. FINDINGS: Right tibia and fibula: 2 small linear hyperdense structure is noted just posterior to the proximal tibia/fibula. This may be due to atherosclerotic calcifications, however foreign bodies difficult to completely exclude. No fracture or dislocation. Chest: Cardiomediastinal silhouette and pulmonary vasculature are within normal limits. Lungs are clear. IMPRESSION: 1.  No acute cardiopulmonary process. 2. No acute fracture or dislocation of the right tibia and fibula. Two small linear hyperdensities located posterior to the proximal tibia/fibular favored to be vascular. Foreign bodies are difficult to completely exclude. Please correlate for focal tenderness at this site. Electronically Signed   By: Miachel Roux M.D.   On: 11/24/2020 14:05   DG Tibia/Fibula Right  Result Date: 11/24/2020 CLINICAL DATA:  Dog bite to right calf Shortness of breath EXAM: CHEST - 2 VIEW; RIGHT TIBIA AND FIBULA - 2 VIEW COMPARISON:  None. FINDINGS: Right tibia and fibula: 2 small linear hyperdense  structure is noted just posterior to the proximal tibia/fibula. This may be due to atherosclerotic calcifications, however foreign bodies difficult to completely exclude. No fracture or  dislocation. Chest: Cardiomediastinal silhouette and pulmonary vasculature are within normal limits. Lungs are clear. IMPRESSION: 1.  No acute cardiopulmonary process. 2. No acute fracture or dislocation of the right tibia and fibula. Two small linear hyperdensities located posterior to the proximal tibia/fibular favored to be vascular. Foreign bodies are difficult to completely exclude. Please correlate for focal tenderness at this site. Electronically Signed   By: Miachel Roux M.D.   On: 11/24/2020 14:05    Procedures Procedures   Medications Ordered in ED Medications  rabies immune globulin (HYPERAB/KEDRAB) injection 1,800 Units (1,800 Units Intramuscular Not Given 11/24/20 1324)  rabies vaccine (RABAVERT) injection 1 mL (1 mL Intramuscular Not Given 11/24/20 1325)  sodium chloride 0.9 % bolus 1,000 mL (has no administration in time range)  Tdap (BOOSTRIX) injection 0.5 mL (0.5 mLs Intramuscular Given 11/24/20 1316)  amoxicillin-clavulanate (AUGMENTIN) 875-125 MG per tablet 1 tablet (1 tablet Oral Given 11/24/20 1315)    ED Course  I have reviewed the triage vital signs and the nursing notes.  Pertinent labs & imaging results that were available during my care of the patient were reviewed by me and considered in my medical decision making (see chart for details).    MDM Rules/Calculators/A&P                          70 year old female who presents to the ED today with initial complaint of dog bite to her right calf that occurred yesterday however did not mention that she also had a syncopal episode while sitting yesterday during dinner.  Reports she felt short of breath and then began to feel lightheaded/dizzy and then passed out for several seconds before coming to.  Had some shortness of breath afterwards, denies chest pain.  EMS evaluated patient on scene with an EKG and advised to come to the ED however patient decided against it as her EKG looked fine.  On arrival to the ED patient is  afebrile, nontachycardic and nontachypneic and appears to be in no acute distress.  She denies any shortness of breath, chest pain, dizziness/lightheadedness currently.  Given her age there is concern for unprovoked syncopal episode and will work-up for cardiac etiology with EKG, chest x-ray, troponin as well as dimer.  We will also need x-ray of the right calf given dog bite.  Had initially discussed providing rabies vaccine to patient given her friend with the dog was unable to provide her with proper documentation for rabies status.  She initially agreed and then declined.  We will control has been contacted with plans to have them sees the dog and monitor for 10 days unless family can provide proper documentation.    EKG without acute ischemic changes CBC without leukocytosis. Hgb stable at 12.9 BMP with sodium 133, bicarb 19, and BUN 29. Creatinine stable at 0.98. Glucose 11. No other electrolyte abnormalities.  D dimer elevated at 0.61 however WNL with age adjustment  Troponin of 5; will plan to repeat  Xray of TIb Fib: IMPRESSION:  1. No acute cardiopulmonary process.  2. No acute fracture or dislocation of the right tibia and fibula.  Two small linear hyperdensities located posterior to the proximal  tibia/fibular favored to be vascular. Foreign bodies are difficult  to completely exclude. Please correlate for focal tenderness at this  site.  Suspect this to be vascular; this is not where pt's puncture wound is located without TTP.   Attending physician Dr. Regenia Skeeter has evaluated patient as well; posed question of outpatient follow up vs admission with questionable syncopal story however pt would ultimately like to go home and follow up with PCP and cardiologist. Dr. Regenia Skeeter and I feel this is appropriate today pending repeat troponin.   At shift change case signed out to oncoming provider Alecia Lemming, PA-C, who will dispo patient accordingly after repeat trop.   This note was  prepared using Dragon voice recognition software and may include unintentional dictation errors due to the inherent limitations of voice recognition software.   Final Clinical Impression(s) / ED Diagnoses Final diagnoses:  Dog bite, initial encounter  Pre-syncope    Rx / DC Orders ED Discharge Orders         Ordered    amoxicillin-clavulanate (AUGMENTIN) 875-125 MG tablet  2 times daily        11/24/20 1452           Eustaquio Maize, PA-C 11/24/20 1503    Sherwood Gambler, MD 11/25/20 [phone]

## 2020-11-24 NOTE — ED Provider Notes (Signed)
3:30 PM Patient was evaluated for dog bite and syncopal episode yesterday. D-dimer neg (age-adjusted), EKG without concerning changes or blocks, first trop neg.  Seen with Dr. Regenia Skeeter. Currently awaiting 2nd troponin. Pt does not want to be admitted.   BP (!) 152/71   Pulse 82   Temp (!) 97.5 F (36.4 C) (Oral)   Resp 18   Wt 94.8 kg   SpO2 98%   BMI 35.32 kg/m   Plan: d/c home with outpatient follow-up if 2nd marker negative.   4:07 PM Troponin 5 >> 7.  Patient updated on results.  She is feeling well and is ready to go home and get something to eat.  She states that she plans to follow-up with her doctor next week --she already has an appointment for Thursday but may try to get it moved up.  We discussed that if she develops any recurrent chest pain, shortness of breath, lightheadedness or passes out, that she should return to the emergency department for reevaluation.  She is comfortable with this plan.  BP (!) 172/90   Pulse 81   Temp (!) 97.5 F (36.4 C) (Oral)   Resp 18   Wt 94.8 kg   SpO2 96%   BMI 35.32 kg/m        Carlisle Cater, Hershal Coria 11/24/20 1612    Luna Fuse, MD 11/24/20 [phone]

## 2020-11-25 NOTE — Telephone Encounter (Signed)
Sent. Thanks.   

## 2020-11-26 ENCOUNTER — Telehealth: Payer: Self-pay

## 2020-11-26 NOTE — Telephone Encounter (Signed)
Pt has a Hosp f/u with you 4/28... pt states she will need a referral to Cardiology before her appt and she prefers [facility] C. Johnsie Cancel, MD.... I advised pt that she may have to wait until upcoming appt and she is aware...Marland Kitchen please advise

## 2020-11-27 NOTE — Telephone Encounter (Signed)
Left message for patient about discussing referral at appt on VM (okay per DPR). Advised to call back if has any questions or concerns.

## 2020-11-27 NOTE — Telephone Encounter (Signed)
I think it makes sense to get the OV done here and then make plans about the referral at that point.  Thanks.

## 2020-11-29 ENCOUNTER — Other Ambulatory Visit: Payer: Self-pay

## 2020-11-29 ENCOUNTER — Ambulatory Visit (INDEPENDENT_AMBULATORY_CARE_PROVIDER_SITE_OTHER): Payer: Medicare Other | Admitting: Family Medicine

## 2020-11-29 ENCOUNTER — Encounter: Payer: Self-pay | Admitting: Family Medicine

## 2020-11-29 VITALS — BP 126/80 | HR 84 | Temp 97.8°F | Ht 65.0 in | Wt 208.0 lb

## 2020-11-29 DIAGNOSIS — R55 Syncope and collapse: Secondary | ICD-10-CM

## 2020-11-29 DIAGNOSIS — R202 Paresthesia of skin: Secondary | ICD-10-CM

## 2020-11-29 DIAGNOSIS — W540XXD Bitten by dog, subsequent encounter: Secondary | ICD-10-CM

## 2020-11-29 NOTE — Progress Notes (Signed)
This visit occurred during the SARS-CoV-2 public health emergency.  Safety protocols were in place, including screening questions prior to the visit, additional usage of staff PPE, and extensive cleaning of exam room while observing appropriate contact time as indicated for disinfecting solutions.  She had MRI done via Dr. Saintclair Halsted.  Prev MRI with   IMPRESSION: 1. Multilevel degenerative changes of the lumbar spine as described above, worst at L4-5 where there is severe spinal canal stenosis, moderate right and severe left neural foraminal narrowing. 2. Mild spinal canal stenosis at L3-4 with moderate left neural foraminal narrowing, progressed since prior MRI. 3. Moderate right neural foraminal narrowing at L1-2 and L2-3.  She is trying to limit surgery; sent went to PT in the meantime.  She is better but not pain free.  She is still doing home exercises.  She is off meloxicam in the meantime.  Tylenol helps some.    She had seen Dr. Cruzita Lederer re: adrenal lesion.  D/w pt. labs done earlier this year were fortunately unremarkable.  She had noted rapid heart beat.  First noted about a few months ago.  Seems to have quick onset.  She didn't notice irregularity.  It can last up to 1 hour.  No CP.  We talked about cardiology referral. Fatigue noted by patient.  "I get weak."   Prev echo with mild AS.  Prev carotid report d/w pt.    Recent dog bite. ER eval, started on augmentin. Likely syncope after that, when back home.  No further syncope in the meantime.  D/w pt about neuro referral re: facial paresthesia.  Done at [facility].    Meds, vitals, and allergies reviewed.   ROS: Per HPI unless specifically indicated in ROS section   GEN: nad, alert and oriented HEENT: ncat NECK: supple w/o LA CV: rrr. SEM noted.  PULM: ctab, no inc wob ABD: soft, +bs EXT: no edema SKIN: no acute rash but scratches on the R calf with 1.5 x 0.5cm scab.  No discharge.   Normal facial sensation now.    30 minutes were  devoted to patient care in this encounter (this includes time spent reviewing the patient's file/history, interviewing and examining the patient, counseling/reviewing plan with patient).

## 2020-11-29 NOTE — Patient Instructions (Signed)
Go to the lab on the way out.   If you have mychart we'll likely use that to update you.    We'll call about the neuro and cardiology appointments.  We'll call about the echo and carotid studies.  Finish the antibiotics.  Update me as needed.  Take care.  Glad to see you.

## 2020-11-30 LAB — TSH: TSH: 1.49 u[IU]/mL (ref 0.35–4.50)

## 2020-12-02 DIAGNOSIS — W540XXA Bitten by dog, initial encounter: Secondary | ICD-10-CM | POA: Insufficient documentation

## 2020-12-02 NOTE — Assessment & Plan Note (Signed)
Refer back to neurology. 

## 2020-12-02 NOTE — Assessment & Plan Note (Signed)
Recheck carotid and echo, ordered.  Defer to cardiology, ordered.  Recheck TSH.  Okay for outpatient follow-up.  She agrees with plan.

## 2020-12-02 NOTE — Assessment & Plan Note (Signed)
Appears to be healing.  Per report, the dog has been reported to animal control.  Finish Augmentin and update me as needed.  She agrees.  Leg appears to be healing normally.

## 2020-12-05 ENCOUNTER — Ambulatory Visit (HOSPITAL_COMMUNITY): Payer: Medicare Other | Attending: Cardiology

## 2020-12-05 ENCOUNTER — Encounter: Payer: Self-pay | Admitting: Family Medicine

## 2020-12-05 ENCOUNTER — Other Ambulatory Visit: Payer: Self-pay

## 2020-12-05 DIAGNOSIS — R55 Syncope and collapse: Secondary | ICD-10-CM | POA: Insufficient documentation

## 2020-12-05 LAB — ECHOCARDIOGRAM COMPLETE
AR max vel: 1.64 cm2
AV Area VTI: 1.51 cm2
AV Area mean vel: 1.53 cm2
AV Mean grad: 9.8 mmHg
AV Peak grad: 17.3 mmHg
Ao pk vel: 2.08 m/s
Area-P 1/2: 3.77 cm2
P 1/2 time: 388 msec
S' Lateral: 3 cm

## 2020-12-09 ENCOUNTER — Other Ambulatory Visit: Payer: Self-pay | Admitting: Family Medicine

## 2020-12-10 ENCOUNTER — Encounter: Payer: Self-pay | Admitting: Neurology

## 2020-12-11 ENCOUNTER — Other Ambulatory Visit: Payer: Self-pay

## 2020-12-11 ENCOUNTER — Ambulatory Visit (HOSPITAL_COMMUNITY)
Admission: RE | Admit: 2020-12-11 | Discharge: 2020-12-11 | Disposition: A | Discharge status: Home / Self Care | Payer: Medicare Other | Source: Ambulatory Visit | Attending: Internal Medicine | Admitting: Internal Medicine

## 2020-12-11 DIAGNOSIS — R55 Syncope and collapse: Secondary | ICD-10-CM | POA: Insufficient documentation

## 2020-12-12 DIAGNOSIS — S29012A Strain of muscle and tendon of back wall of thorax, initial encounter: Secondary | ICD-10-CM | POA: Diagnosis not present

## 2020-12-12 DIAGNOSIS — M9905 Segmental and somatic dysfunction of pelvic region: Secondary | ICD-10-CM | POA: Diagnosis not present

## 2020-12-12 DIAGNOSIS — S338XXA Sprain of other parts of lumbar spine and pelvis, initial encounter: Secondary | ICD-10-CM | POA: Diagnosis not present

## 2020-12-12 DIAGNOSIS — M9903 Segmental and somatic dysfunction of lumbar region: Secondary | ICD-10-CM | POA: Diagnosis not present

## 2020-12-12 DIAGNOSIS — M48061 Spinal stenosis, lumbar region without neurogenic claudication: Secondary | ICD-10-CM | POA: Diagnosis not present

## 2020-12-12 DIAGNOSIS — M9902 Segmental and somatic dysfunction of thoracic region: Secondary | ICD-10-CM | POA: Diagnosis not present

## 2020-12-17 DIAGNOSIS — M9905 Segmental and somatic dysfunction of pelvic region: Secondary | ICD-10-CM | POA: Diagnosis not present

## 2020-12-17 DIAGNOSIS — M9903 Segmental and somatic dysfunction of lumbar region: Secondary | ICD-10-CM | POA: Diagnosis not present

## 2020-12-17 DIAGNOSIS — M48061 Spinal stenosis, lumbar region without neurogenic claudication: Secondary | ICD-10-CM | POA: Diagnosis not present

## 2020-12-17 DIAGNOSIS — M9902 Segmental and somatic dysfunction of thoracic region: Secondary | ICD-10-CM | POA: Diagnosis not present

## 2020-12-17 DIAGNOSIS — S29012A Strain of muscle and tendon of back wall of thorax, initial encounter: Secondary | ICD-10-CM | POA: Diagnosis not present

## 2020-12-17 DIAGNOSIS — S338XXA Sprain of other parts of lumbar spine and pelvis, initial encounter: Secondary | ICD-10-CM | POA: Diagnosis not present

## 2020-12-19 DIAGNOSIS — M48061 Spinal stenosis, lumbar region without neurogenic claudication: Secondary | ICD-10-CM | POA: Diagnosis not present

## 2020-12-19 DIAGNOSIS — M9903 Segmental and somatic dysfunction of lumbar region: Secondary | ICD-10-CM | POA: Diagnosis not present

## 2020-12-19 DIAGNOSIS — M9905 Segmental and somatic dysfunction of pelvic region: Secondary | ICD-10-CM | POA: Diagnosis not present

## 2020-12-19 DIAGNOSIS — M9902 Segmental and somatic dysfunction of thoracic region: Secondary | ICD-10-CM | POA: Diagnosis not present

## 2020-12-19 DIAGNOSIS — S338XXA Sprain of other parts of lumbar spine and pelvis, initial encounter: Secondary | ICD-10-CM | POA: Diagnosis not present

## 2020-12-19 DIAGNOSIS — S29012A Strain of muscle and tendon of back wall of thorax, initial encounter: Secondary | ICD-10-CM | POA: Diagnosis not present

## 2020-12-24 DIAGNOSIS — M9903 Segmental and somatic dysfunction of lumbar region: Secondary | ICD-10-CM | POA: Diagnosis not present

## 2020-12-24 DIAGNOSIS — M9905 Segmental and somatic dysfunction of pelvic region: Secondary | ICD-10-CM | POA: Diagnosis not present

## 2020-12-24 DIAGNOSIS — S338XXA Sprain of other parts of lumbar spine and pelvis, initial encounter: Secondary | ICD-10-CM | POA: Diagnosis not present

## 2020-12-24 DIAGNOSIS — M9902 Segmental and somatic dysfunction of thoracic region: Secondary | ICD-10-CM | POA: Diagnosis not present

## 2020-12-24 DIAGNOSIS — M48061 Spinal stenosis, lumbar region without neurogenic claudication: Secondary | ICD-10-CM | POA: Diagnosis not present

## 2020-12-24 DIAGNOSIS — S29012A Strain of muscle and tendon of back wall of thorax, initial encounter: Secondary | ICD-10-CM | POA: Diagnosis not present

## 2020-12-26 DIAGNOSIS — S338XXA Sprain of other parts of lumbar spine and pelvis, initial encounter: Secondary | ICD-10-CM | POA: Diagnosis not present

## 2020-12-26 DIAGNOSIS — M9902 Segmental and somatic dysfunction of thoracic region: Secondary | ICD-10-CM | POA: Diagnosis not present

## 2020-12-26 DIAGNOSIS — S29012A Strain of muscle and tendon of back wall of thorax, initial encounter: Secondary | ICD-10-CM | POA: Diagnosis not present

## 2020-12-26 DIAGNOSIS — M9903 Segmental and somatic dysfunction of lumbar region: Secondary | ICD-10-CM | POA: Diagnosis not present

## 2020-12-26 DIAGNOSIS — M48061 Spinal stenosis, lumbar region without neurogenic claudication: Secondary | ICD-10-CM | POA: Diagnosis not present

## 2020-12-26 DIAGNOSIS — M9905 Segmental and somatic dysfunction of pelvic region: Secondary | ICD-10-CM | POA: Diagnosis not present

## 2021-01-01 DIAGNOSIS — S29012A Strain of muscle and tendon of back wall of thorax, initial encounter: Secondary | ICD-10-CM | POA: Diagnosis not present

## 2021-01-01 DIAGNOSIS — M9902 Segmental and somatic dysfunction of thoracic region: Secondary | ICD-10-CM | POA: Diagnosis not present

## 2021-01-01 DIAGNOSIS — M9903 Segmental and somatic dysfunction of lumbar region: Secondary | ICD-10-CM | POA: Diagnosis not present

## 2021-01-01 DIAGNOSIS — S338XXA Sprain of other parts of lumbar spine and pelvis, initial encounter: Secondary | ICD-10-CM | POA: Diagnosis not present

## 2021-01-01 DIAGNOSIS — M48061 Spinal stenosis, lumbar region without neurogenic claudication: Secondary | ICD-10-CM | POA: Diagnosis not present

## 2021-01-01 DIAGNOSIS — M9905 Segmental and somatic dysfunction of pelvic region: Secondary | ICD-10-CM | POA: Diagnosis not present

## 2021-01-03 DIAGNOSIS — M48061 Spinal stenosis, lumbar region without neurogenic claudication: Secondary | ICD-10-CM | POA: Diagnosis not present

## 2021-01-03 DIAGNOSIS — M9903 Segmental and somatic dysfunction of lumbar region: Secondary | ICD-10-CM | POA: Diagnosis not present

## 2021-01-03 DIAGNOSIS — M9902 Segmental and somatic dysfunction of thoracic region: Secondary | ICD-10-CM | POA: Diagnosis not present

## 2021-01-03 DIAGNOSIS — M9905 Segmental and somatic dysfunction of pelvic region: Secondary | ICD-10-CM | POA: Diagnosis not present

## 2021-01-03 DIAGNOSIS — S29012A Strain of muscle and tendon of back wall of thorax, initial encounter: Secondary | ICD-10-CM | POA: Diagnosis not present

## 2021-01-03 DIAGNOSIS — S338XXA Sprain of other parts of lumbar spine and pelvis, initial encounter: Secondary | ICD-10-CM | POA: Diagnosis not present

## 2021-01-08 DIAGNOSIS — S29012A Strain of muscle and tendon of back wall of thorax, initial encounter: Secondary | ICD-10-CM | POA: Diagnosis not present

## 2021-01-08 DIAGNOSIS — M9905 Segmental and somatic dysfunction of pelvic region: Secondary | ICD-10-CM | POA: Diagnosis not present

## 2021-01-08 DIAGNOSIS — S338XXA Sprain of other parts of lumbar spine and pelvis, initial encounter: Secondary | ICD-10-CM | POA: Diagnosis not present

## 2021-01-08 DIAGNOSIS — M9902 Segmental and somatic dysfunction of thoracic region: Secondary | ICD-10-CM | POA: Diagnosis not present

## 2021-01-08 DIAGNOSIS — M9903 Segmental and somatic dysfunction of lumbar region: Secondary | ICD-10-CM | POA: Diagnosis not present

## 2021-01-08 DIAGNOSIS — M48061 Spinal stenosis, lumbar region without neurogenic claudication: Secondary | ICD-10-CM | POA: Diagnosis not present

## 2021-01-10 DIAGNOSIS — M48061 Spinal stenosis, lumbar region without neurogenic claudication: Secondary | ICD-10-CM | POA: Diagnosis not present

## 2021-01-10 DIAGNOSIS — S338XXA Sprain of other parts of lumbar spine and pelvis, initial encounter: Secondary | ICD-10-CM | POA: Diagnosis not present

## 2021-01-10 DIAGNOSIS — S29012A Strain of muscle and tendon of back wall of thorax, initial encounter: Secondary | ICD-10-CM | POA: Diagnosis not present

## 2021-01-10 DIAGNOSIS — M9903 Segmental and somatic dysfunction of lumbar region: Secondary | ICD-10-CM | POA: Diagnosis not present

## 2021-01-10 DIAGNOSIS — M9905 Segmental and somatic dysfunction of pelvic region: Secondary | ICD-10-CM | POA: Diagnosis not present

## 2021-01-10 DIAGNOSIS — M9902 Segmental and somatic dysfunction of thoracic region: Secondary | ICD-10-CM | POA: Diagnosis not present

## 2021-01-15 DIAGNOSIS — M9903 Segmental and somatic dysfunction of lumbar region: Secondary | ICD-10-CM | POA: Diagnosis not present

## 2021-01-15 DIAGNOSIS — S29012A Strain of muscle and tendon of back wall of thorax, initial encounter: Secondary | ICD-10-CM | POA: Diagnosis not present

## 2021-01-15 DIAGNOSIS — M9902 Segmental and somatic dysfunction of thoracic region: Secondary | ICD-10-CM | POA: Diagnosis not present

## 2021-01-15 DIAGNOSIS — S338XXA Sprain of other parts of lumbar spine and pelvis, initial encounter: Secondary | ICD-10-CM | POA: Diagnosis not present

## 2021-01-15 DIAGNOSIS — M9905 Segmental and somatic dysfunction of pelvic region: Secondary | ICD-10-CM | POA: Diagnosis not present

## 2021-01-15 DIAGNOSIS — M48061 Spinal stenosis, lumbar region without neurogenic claudication: Secondary | ICD-10-CM | POA: Diagnosis not present

## 2021-01-18 ENCOUNTER — Other Ambulatory Visit: Payer: Self-pay | Admitting: Family Medicine

## 2021-01-18 ENCOUNTER — Encounter: Payer: Self-pay | Admitting: Family Medicine

## 2021-01-20 ENCOUNTER — Other Ambulatory Visit: Payer: Self-pay | Admitting: Family Medicine

## 2021-01-20 MED ORDER — OMEPRAZOLE 20 MG PO CPDR: 20.0000 mg | DELAYED_RELEASE_CAPSULE | Freq: Every day | ORAL | 1 refills | Status: DC

## 2021-01-20 NOTE — Progress Notes (Signed)
Rx sent 

## 2021-01-21 NOTE — Telephone Encounter (Signed)
Refill request for Alprazolam 0.5 mg tablets  LOV - 11/29/20 Next OV - 04/30/21 Last refill - 11/25/20 #60/1

## 2021-01-21 NOTE — Telephone Encounter (Signed)
Left message for patient that rx was sent.

## 2021-01-21 NOTE — Telephone Encounter (Signed)
Received message on triage line patient following up on refill. Would like to have done today. Would like call once sent in.

## 2021-01-21 NOTE — Telephone Encounter (Signed)
Sent. Thanks.   

## 2021-01-22 DIAGNOSIS — S338XXA Sprain of other parts of lumbar spine and pelvis, initial encounter: Secondary | ICD-10-CM | POA: Diagnosis not present

## 2021-01-22 DIAGNOSIS — M9905 Segmental and somatic dysfunction of pelvic region: Secondary | ICD-10-CM | POA: Diagnosis not present

## 2021-01-22 DIAGNOSIS — M9902 Segmental and somatic dysfunction of thoracic region: Secondary | ICD-10-CM | POA: Diagnosis not present

## 2021-01-22 DIAGNOSIS — S29012A Strain of muscle and tendon of back wall of thorax, initial encounter: Secondary | ICD-10-CM | POA: Diagnosis not present

## 2021-01-22 DIAGNOSIS — M48061 Spinal stenosis, lumbar region without neurogenic claudication: Secondary | ICD-10-CM | POA: Diagnosis not present

## 2021-01-22 DIAGNOSIS — M9903 Segmental and somatic dysfunction of lumbar region: Secondary | ICD-10-CM | POA: Diagnosis not present

## 2021-01-24 DIAGNOSIS — S338XXA Sprain of other parts of lumbar spine and pelvis, initial encounter: Secondary | ICD-10-CM | POA: Diagnosis not present

## 2021-01-24 DIAGNOSIS — M9905 Segmental and somatic dysfunction of pelvic region: Secondary | ICD-10-CM | POA: Diagnosis not present

## 2021-01-24 DIAGNOSIS — S29012A Strain of muscle and tendon of back wall of thorax, initial encounter: Secondary | ICD-10-CM | POA: Diagnosis not present

## 2021-01-24 DIAGNOSIS — M9902 Segmental and somatic dysfunction of thoracic region: Secondary | ICD-10-CM | POA: Diagnosis not present

## 2021-01-24 DIAGNOSIS — M9903 Segmental and somatic dysfunction of lumbar region: Secondary | ICD-10-CM | POA: Diagnosis not present

## 2021-01-24 DIAGNOSIS — M48061 Spinal stenosis, lumbar region without neurogenic claudication: Secondary | ICD-10-CM | POA: Diagnosis not present

## 2021-01-29 DIAGNOSIS — M9905 Segmental and somatic dysfunction of pelvic region: Secondary | ICD-10-CM | POA: Diagnosis not present

## 2021-01-29 DIAGNOSIS — S29012A Strain of muscle and tendon of back wall of thorax, initial encounter: Secondary | ICD-10-CM | POA: Diagnosis not present

## 2021-01-29 DIAGNOSIS — S338XXA Sprain of other parts of lumbar spine and pelvis, initial encounter: Secondary | ICD-10-CM | POA: Diagnosis not present

## 2021-01-29 DIAGNOSIS — M9902 Segmental and somatic dysfunction of thoracic region: Secondary | ICD-10-CM | POA: Diagnosis not present

## 2021-01-29 DIAGNOSIS — M48061 Spinal stenosis, lumbar region without neurogenic claudication: Secondary | ICD-10-CM | POA: Diagnosis not present

## 2021-01-29 DIAGNOSIS — M9903 Segmental and somatic dysfunction of lumbar region: Secondary | ICD-10-CM | POA: Diagnosis not present

## 2021-02-10 NOTE — Progress Notes (Signed)
CARDIOLOGY CONSULT NOTE       Patient ID: Michelle Park MRN: [id number] DOB/AGE: 71/08/1950 71 y.o.  Admit date: (Not on file) Referring Physician: Carlynn Purl Primary Physician: Tonia Ghent, MD Primary Cardiologist: New Reason for Consultation: Syncope  Active Problems:   * No active hospital problems. *   HPI:  70 y.o. referred by Dr Damita Dunnings for syncope. History of HTN, HLD, GERD, Fibromyalgia, COPD Seen in [place] 11/24/20 after dog bite She had bites in both calves Went home and had pizza with some dyspnea and dizziness ? 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 Usually occurs with some sort of  Stress and frequently has GI overtones but not always. 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 and has 3 cats   ROS All other systems reviewed and negative except as noted above  Past Medical History:  Diagnosis Date   Allergic rhinitis due to pollen    Anxiety    Bronchitis    hx of   Chronic back pain    COPD (chronic obstructive pulmonary disease) (HCC)    Depression    Dyspnea    with activity only   Episodic mood disorder (HCC)    Fibromyalgia    GERD (gastroesophageal reflux disease)    Headache    frequent   Heart murmur    Hyperlipidemia    Hypertension    Restless leg syndrome    Spinal stenosis     Family History  Problem Relation Age of Onset   Heart  disease Mother    Alzheimer's disease Mother    Cancer Sister    Colon cancer Neg Hx    Esophageal cancer Neg Hx    Inflammatory bowel disease Neg Hx    Liver disease Neg Hx    Pancreatic cancer Neg Hx    Rectal cancer Neg Hx    Stomach cancer Neg Hx     Social History   Socioeconomic History   Marital status: Divorced    Spouse name: Not on file   Number of children: 1   Years of education: Not on file   Highest education level: Not on file  Occupational History   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   1 daughter   Social Determinants of Health   Financial Resource Strain: Not on file  Food Insecurity: Not on file  Transportation Needs: Not on file  Physical Activity: Not on file  Stress: Not on file  Social Connections: Not on file  Intimate Partner Violence: Not on file    Past Surgical History:  Procedure Laterality Date   BACK SURGERY     2019 L spine Dr. Saintclair Halsted   ESOPHAGOGASTRODUODENOSCOPY     [place] and 1988   ingrown toenail     bil great toes   surgery   ROBOTIC ADRENALECTOMY Left 04/01/2019   Procedure: XI ROBOTIC LEFT ADRENALECTOMY;  Surgeon: Ralene Ok, MD;  Location: [facility];  Service: General;  Laterality: Left;   TONSILLECTOMY        Current Outpatient Medications:    ALPRAZolam (XANAX) 0.5 MG tablet, Take 1 tablet by mouth twice daily as needed, Disp: 60 tablet, Rfl: 1   calcium-vitamin D (OSCAL WITH D) 500-200 MG-UNIT per  tablet, Take 1 tablet by mouth 3 (three) times daily with meals. 1500 mg per day for low bone densit, Disp: , Rfl:    Cholecalciferol (VITAMIN D) 125 MCG (5000 UT) CAPS, Take 1 tablet by mouth daily., Disp: , Rfl:    fenofibrate 160 MG tablet, Take 1 tablet (160 mg total) by mouth daily after supper., Disp: 90 tablet, Rfl: 1   losartan (COZAAR) 100 MG tablet, Take 1 tablet by mouth once daily, Disp: 90 tablet, Rfl: 2   Multiple Vitamin (MULTIVITAMIN WITH MINERALS) TABS tablet, Take 1 tablet by mouth daily after lunch. Centrum, Disp: , Rfl:    omeprazole (PRILOSEC) 20 MG capsule, Take 1 capsule (20 mg total) by mouth daily., Disp: 90 capsule, Rfl: 1   Vitamin D-Vitamin K (VITAMIN K2-VITAMIN D3) 45-2000 MCG-UNIT CAPS, Take 1 tablet by mouth daily., Disp: , Rfl:    Zinc 15 MG CAPS, Take 1 capsule by mouth daily., Disp: , Rfl:     Physical Exam: There were no vitals taken for this visit.   Affect appropriate Healthy:  appears stated age 69: normal Neck supple with no adenopathy JVP normal  bilateral bruits no thyromegaly Lungs clear with no wheezing and good diaphragmatic motion Heart:  S1/S2 preserved mild AS murmur, no rub, gallop or click PMI normal Abdomen: benighn, BS positve, no tenderness, no AAA no bruit.  No HSM or HJR Distal pulses intact with no bruits No edema Neuro non-focal Skin warm and dry No muscular weakness   Labs:   Lab Results  Component Value Date   WBC 9.6 11/24/2020   HGB 12.9 11/24/2020   HCT 40.4 11/24/2020   MCV 92.4 11/24/2020   PLT 399 11/24/2020   No results for input(s): NA, K, CL, CO2, BUN, CREATININE, CALCIUM, PROT, BILITOT, ALKPHOS, ALT, AST, GLUCOSE in the last 168 hours.  Invalid input(s): LABALBU No results found for: CKTOTAL, CKMB, CKMBINDEX, TROPONINI  Lab Results  Component Value Date   CHOL 239 (H) 03/21/2015   CHOL 145 07/16/2010   CHOL 258 (H) 10/12/2009   Lab Results  Component Value Date   HDL 37.10 (L) 03/21/2015   HDL  53.60 07/16/2010   HDL 48.40 10/12/2009   Lab Results  Component Value Date   LDLCALC 70 07/16/2010   Lab Results  Component Value Date  TRIG 209.0 (H) 03/21/2015   TRIG 107.0 07/16/2010   TRIG 178.0 (H) 10/12/2009   Lab Results  Component Value Date   CHOLHDL 6 03/21/2015   CHOLHDL 3 07/16/2010   CHOLHDL 5 10/12/2009   Lab Results  Component Value Date   LDLDIRECT 171.0 03/21/2015   LDLDIRECT 187.2 10/12/2009   LDLDIRECT 154.9 07/12/2009      Radiology: No results found.  EKG: SR LAE normal 11/24/20    ASSESSMENT AND PLAN:   Syncope:  non cardiac negative w/u in ER ECG normal except for possible LAE  LA 4.2 cm on TTE She had no palpitations or chest pain prior to incident Likely vagal mediated Given AS and carotid bruit with ongoing smoking favor r/o CAD with Lexiscan myovue  AS:  mild not clinically significant in setting of normal EF f/u echo in a year  GERD: continue prilosec avoid pizza !! HTN  continue ARB weight loss and low sodium DASH type diet  Carotid:  left ICA 40-59% f/u duplex in a year ASA  Smoking counseled on cessation < 10 minutes CXR 4/22 ok should have yearly Lung cancer screening  CT per primary   Lexiscan Myovue  Echo in a year AS  Carotid duplex in a year   F/U in a year if myovue normal   Signed: Jenkins Rouge 02/10/2021, 2:39 PM

## 2021-02-14 DIAGNOSIS — I10 Essential (primary) hypertension: Secondary | ICD-10-CM | POA: Diagnosis not present

## 2021-02-14 DIAGNOSIS — M5440 Lumbago with sciatica, unspecified side: Secondary | ICD-10-CM | POA: Diagnosis not present

## 2021-02-19 ENCOUNTER — Other Ambulatory Visit: Payer: Self-pay

## 2021-02-19 ENCOUNTER — Ambulatory Visit: Payer: Medicare Other | Admitting: Cardiovascular Disease

## 2021-02-19 ENCOUNTER — Encounter: Payer: Self-pay | Admitting: Cardiovascular Disease

## 2021-02-19 VITALS — BP 124/86 | HR 96 | Ht 64.5 in | Wt 213.0 lb

## 2021-02-19 DIAGNOSIS — R55 Syncope and collapse: Secondary | ICD-10-CM

## 2021-02-19 DIAGNOSIS — R0989 Other specified symptoms and signs involving the circulatory and respiratory systems: Secondary | ICD-10-CM

## 2021-02-19 DIAGNOSIS — F17200 Nicotine dependence, unspecified, uncomplicated: Secondary | ICD-10-CM

## 2021-02-19 DIAGNOSIS — I10 Essential (primary) hypertension: Secondary | ICD-10-CM

## 2021-02-19 DIAGNOSIS — I350 Nonrheumatic aortic (valve) stenosis: Secondary | ICD-10-CM | POA: Diagnosis not present

## 2021-02-19 NOTE — Patient Instructions (Addendum)
Medication Instructions:  *If you need a refill on your cardiac medications before your next appointment, please call your pharmacy*  Lab Work: If you have labs (blood work) drawn today and your tests are completely normal, you will receive your results only by: [facility] (if you have MyChart) OR A paper copy in the mail If you have any lab test that is abnormal or we need to change your treatment, we will call you to review the results.  Testing/Procedures: Your physician has requested that you have a lexiscan myoview. For further information please visit [email or web address]. Please follow instruction sheet, as given.  Follow-Up: At [facility], you and your health needs are our priority.  As part of our continuing mission to provide you with exceptional heart care, we have created designated Provider Care Teams.  These Care Teams include your primary Cardiologist (physician) and Advanced Practice Providers (APPs -  Physician Assistants and Nurse Practitioners) who all work together to provide you with the care you need, when you need it.  We recommend signing up for the patient portal called "MyChart".  Sign up information is provided on this After Visit Summary.  MyChart is used to connect with patients for Virtual Visits (Telemedicine).  Patients are able to view lab/test results, encounter notes, upcoming appointments, etc.  Non-urgent messages can be sent to your provider as well.   To learn more about what you can do with MyChart, go to [email or web address].    Your next appointment:   12 month(s)  The format for your next appointment:   In Person  Provider:   You may see Dr. Johnsie Cancel or one of the following Advanced Practice Providers on your designated Care Team:   Cecilie Kicks, NP

## 2021-02-21 ENCOUNTER — Other Ambulatory Visit: Payer: Self-pay

## 2021-02-21 ENCOUNTER — Ambulatory Visit: Payer: Medicare Other | Admitting: Neurology

## 2021-02-21 ENCOUNTER — Encounter: Payer: Self-pay | Admitting: Neurology

## 2021-02-21 VITALS — BP 122/74 | HR 90 | Ht 64.5 in | Wt 210.0 lb

## 2021-02-21 DIAGNOSIS — R200 Anesthesia of skin: Secondary | ICD-10-CM | POA: Diagnosis not present

## 2021-02-21 DIAGNOSIS — H93A2 Pulsatile tinnitus, left ear: Secondary | ICD-10-CM | POA: Diagnosis not present

## 2021-02-21 NOTE — Progress Notes (Signed)
NEUROLOGY CONSULTATION NOTE  Michelle Park MRN: [id number] DOB: 1951-07-12  Referring provider: Tonia Ghent, MD Primary care provider: Tonia Ghent, MD  Reason for consult:  facial paresthesia  Assessment/Plan:   Intermittent left facial numbness/paresthesias - unclear etiology.   Pulsatile tinnitus left ear Dizziness  1  MRI of brain with and without contrast to evaluate for explainable cause 2  MRA of head to further evaluate cause of pulsatile tinnitus.  Carotid ultrasound does reveal moderate left ICA stenosis but nothing that would require intervention.  TSH normal. 3.  Further recommendations pending results.   Subjective:  Michelle Park is a 70 year old left-handed female with HTN, COPD, fibromyalgia, chronic back pain, depression and anxiety who presents for facial paresthesias.  History supplemented by referring provider's notes.  She has a remote history of left sided Bell's palsy.  She began experiencing intermittent tingling of the left V2 and V3 distribution about 1 1/2 years ago.  No associated headache, neck pain, facial pain, facial weakness, rash, or involvement of extremities.  It would last several minutes and occur 2 to 3 times a week.  She also began hearing her heartbeat in her left ear.  She saw the ENT who cleaned her ear but it didn't improve.  She also has had occasional brief episodes of dizziness/spinning.  CT head on 05/07/2020 personally reviewed was normal.  Carotid ultrasound from May revealed 40-59% left ICA stenosis, 1-39% right ICA stenosis.  B12 level from last September was 355.  TSH from April was 1.49.     PAST MEDICAL HISTORY: Past Medical History:  Diagnosis Date   Allergic rhinitis due to pollen    Anxiety    Bronchitis    hx of   Chronic back pain    COPD (chronic obstructive pulmonary disease) (HCC)    Depression    Dyspnea    with activity only   Episodic mood disorder (HCC)    Fibromyalgia    GERD (gastroesophageal  reflux disease)    Headache    frequent   Heart murmur    Hyperlipidemia    Hypertension    Restless leg syndrome    Spinal stenosis     PAST SURGICAL HISTORY: Past Surgical History:  Procedure Laterality Date   BACK SURGERY     2019 L spine Dr. Saintclair Halsted   ESOPHAGOGASTRODUODENOSCOPY     EYE SURGERY     cataract bil   FOOT SURGERY  1979 and 1988   ingrown toenail     bil great toes   surgery   ROBOTIC ADRENALECTOMY Left 04/01/2019   Procedure: XI ROBOTIC LEFT ADRENALECTOMY;  Surgeon: Ralene Ok, MD;  Location: [facility];  Service: General;  Laterality: Left;   TONSILLECTOMY      MEDICATIONS: Current Outpatient Medications on File Prior to Visit  Medication Sig Dispense Refill   ALPRAZolam (XANAX) 0.5 MG tablet Take 1 tablet by mouth twice daily as needed 60 tablet 1   calcium-vitamin D (OSCAL WITH D) 500-200 MG-UNIT per tablet Take 1 tablet by mouth 3 (three) times daily with meals. 1500 mg per day for low bone densit     Cholecalciferol (VITAMIN D) 125 MCG (5000 UT) CAPS Take 1 tablet by mouth daily.     fenofibrate 160 MG tablet Take 1 tablet (160 mg total) by mouth daily after supper. 90 tablet 1   losartan (COZAAR) 100 MG tablet Take 1 tablet by mouth once daily 90 tablet 2   Multiple  Vitamin (MULTIVITAMIN WITH MINERALS) TABS tablet Take 1 tablet by mouth daily after lunch. Centrum     omeprazole (PRILOSEC) 20 MG capsule Take 1 capsule (20 mg total) by mouth daily. 90 capsule 1   traMADol (ULTRAM) 50 MG tablet Take 50 mg by mouth every 6 (six) hours as needed.     Vitamin D-Vitamin K (VITAMIN K2-VITAMIN D3) 45-2000 MCG-UNIT CAPS Take 1 tablet by mouth daily.     Zinc 15 MG CAPS Take 1 capsule by mouth daily.     No current facility-administered medications on file prior to visit.    ALLERGIES: Allergies  Allergen Reactions   Aspirin Other (See Comments)    Indigestion/throat burns   Atorvastatin     REACTION: causes leg pain   Rosuvastatin Other (See Comments)     Myalgias    FAMILY HISTORY: Family History  Problem Relation Age of Onset   Heart disease Mother    Alzheimer's disease Mother    Cancer Sister    Colon cancer Neg Hx    Esophageal cancer Neg Hx    Inflammatory bowel disease Neg Hx    Liver disease Neg Hx    Pancreatic cancer Neg Hx    Rectal cancer Neg Hx    Stomach cancer Neg Hx     Objective:  Blood pressure 122/74, pulse 90, height 5' 4.5" (1.638 m), weight 210 lb (95.3 kg), SpO2 98 %. General: No acute distress.  Patient appears well-groomed.   Head:  Normocephalic/atraumatic Eyes:  fundi examined but not visualized Neck: supple, no paraspinal tenderness, full range of motion Back: No paraspinal tenderness Heart: regular rate and rhythm Lungs: Clear to auscultation bilaterally. Vascular: No carotid bruits. Neurological Exam: Mental status: alert and oriented to person, place, and time, recent and remote memory intact, fund of knowledge intact, attention and concentration intact, speech fluent and not dysarthric, language intact. Cranial nerves: CN I: not tested CN II: pupils equal, round and reactive to light, visual fields intact CN III, IV, VI:  full range of motion, no nystagmus, no ptosis CN V: facial sensation intact. CN VII: upper and lower face symmetric CN VIII: hearing intact CN IX, X: gag intact, uvula midline CN XI: sternocleidomastoid and trapezius muscles intact CN XII: tongue midline Bulk & Tone: normal, no fasciculations. Motor:  muscle strength 5/5 throughout Sensation:  Pinprick, temperature and vibratory sensation intact. Deep Tendon Reflexes:  2+ throughout,  toes downgoing.   Finger to nose testing:  Without dysmetria.   Heel to shin:  Without dysmetria.   Gait:  Normal station and stride.  Romberg negative.    Thank you for allowing me to take part in the care of this patient.  Metta Clines, DO  CC: Tonia Ghent, MD

## 2021-02-21 NOTE — Patient Instructions (Signed)
Check MRI of brain with and without contrast Check MRA of head Further recommendations pending results.

## 2021-02-27 ENCOUNTER — Telehealth (HOSPITAL_COMMUNITY): Payer: Self-pay

## 2021-02-27 NOTE — Telephone Encounter (Signed)
Detailed instructions left on the patient's answering machine. Asked to call back with any questions. S.Lekesha Claw EMTP 

## 2021-03-05 ENCOUNTER — Telehealth (HOSPITAL_COMMUNITY): Payer: Self-pay | Admitting: *Deleted

## 2021-03-05 ENCOUNTER — Encounter (HOSPITAL_COMMUNITY): Payer: Medicare Other

## 2021-03-05 ENCOUNTER — Encounter (HOSPITAL_COMMUNITY): Payer: Self-pay

## 2021-03-05 ENCOUNTER — Telehealth (HOSPITAL_COMMUNITY): Payer: Self-pay | Admitting: Cardiovascular Disease

## 2021-03-05 NOTE — Telephone Encounter (Signed)
Patient called and cancelled Myoview for reason below: 03/05/2021 9:00 AM By: Gardiner Coins  Cancel Rsn: Patient (Pt is feeling sick, will call back to reschedule.)  Order will be removed from the [facility] and when patient calls back to reschedule we will reinstate the order.

## 2021-03-05 NOTE — Telephone Encounter (Signed)
Error.Michelle Park, Michelle Park

## 2021-03-18 ENCOUNTER — Other Ambulatory Visit: Payer: Self-pay | Admitting: Family Medicine

## 2021-03-18 NOTE — Telephone Encounter (Addendum)
Pt left v/m requesting refill on xanax 0.5 mg today. I spoke with pt and advised it is requested to give 48 -72 hr. Prior to needing refill. Pt said she needs it sent to [facility] today and pt has never had a problem before. Pt takes xaxax 0.5 mg bid everyday and pt does not have med for this evening. I spoke with Janett Billow CMA and she will make sure Dr Damita Dunnings sees this med refill.  Name of Medication: xanax 0.5 mg Name of [place] or Written Date and Quantity: # 60 x 1 on 01/21/21 Last Office Visit and Type: 11/29/20 acute visit for fatigue Next Office Visit and Type: 04/30/21 annual wellness

## 2021-03-18 NOTE — Telephone Encounter (Signed)
Sent. Thanks.   

## 2021-03-18 NOTE — Telephone Encounter (Signed)
Patient left a voicemail stating that she had talked with a nurse earlier. Patient stated that she was calling back to let you know that the date on her bottle is 02/18/21 and that has been 29 days.

## 2021-04-02 ENCOUNTER — Telehealth (HOSPITAL_COMMUNITY): Payer: Self-pay | Admitting: *Deleted

## 2021-04-02 NOTE — Telephone Encounter (Signed)
Patient given detailed instructions per Myocardial Perfusion Study Information Sheet for the test on 04/03/21 Patient notified to arrive 15 minutes early and that it is imperative to arrive on time for appointment to keep from having the test rescheduled.  If you need to cancel or reschedule your appointment, please call the office within 24 hours of your appointment. . Patient verbalized understanding. Laurice Iglesia Jacqueline   

## 2021-04-03 ENCOUNTER — Ambulatory Visit (HOSPITAL_COMMUNITY): Payer: Medicare Other | Attending: Cardiology

## 2021-04-03 ENCOUNTER — Other Ambulatory Visit: Payer: Self-pay

## 2021-04-03 DIAGNOSIS — R55 Syncope and collapse: Secondary | ICD-10-CM | POA: Diagnosis not present

## 2021-04-03 LAB — MYOCARDIAL PERFUSION IMAGING
Base ST Depression (mm): 0 mm
LV dias vol: 71 mL (ref 46–106)
LV sys vol: 25 mL
Nuc Stress EF: 65 %
Peak HR: 96 {beats}/min
Rest HR: 71 {beats}/min
Rest Nuclear Isotope Dose: 10 mCi
SDS: 0
SRS: 0
SSS: 0
ST Depression (mm): 0 mm
Stress Nuclear Isotope Dose: 31.3 mCi
TID: 1.12

## 2021-04-03 MED ORDER — TECHNETIUM TC 99M TETROFOSMIN IV KIT
10.0000 | PACK | Freq: Once | INTRAVENOUS | Status: AC | PRN
  Administered 2021-04-03: 10 via INTRAVENOUS
  Filled 2021-04-03: qty 10

## 2021-04-03 MED ORDER — TECHNETIUM TC 99M TETROFOSMIN IV KIT
31.3000 | PACK | Freq: Once | INTRAVENOUS | Status: AC | PRN
  Administered 2021-04-03: 31.3 via INTRAVENOUS
  Filled 2021-04-03: qty 32

## 2021-04-03 MED ORDER — REGADENOSON 0.4 MG/5ML IV SOLN
0.4000 mg | Freq: Once | INTRAVENOUS | Status: AC
  Administered 2021-04-03: 0.4 mg via INTRAVENOUS

## 2021-04-14 ENCOUNTER — Other Ambulatory Visit: Payer: Self-pay | Admitting: Family Medicine

## 2021-04-14 DIAGNOSIS — I10 Essential (primary) hypertension: Secondary | ICD-10-CM

## 2021-04-23 ENCOUNTER — Ambulatory Visit: Payer: Medicare Other

## 2021-04-23 ENCOUNTER — Other Ambulatory Visit (INDEPENDENT_AMBULATORY_CARE_PROVIDER_SITE_OTHER): Payer: Medicare Other

## 2021-04-23 ENCOUNTER — Other Ambulatory Visit: Payer: Self-pay

## 2021-04-23 DIAGNOSIS — I10 Essential (primary) hypertension: Secondary | ICD-10-CM | POA: Diagnosis not present

## 2021-04-23 LAB — COMPREHENSIVE METABOLIC PANEL
ALT: 15 U/L (ref 0–35)
AST: 15 U/L (ref 0–37)
Albumin: 3.9 g/dL (ref 3.5–5.2)
Alkaline Phosphatase: 52 U/L (ref 39–117)
BUN: 16 mg/dL (ref 6–23)
CO2: 26 mEq/L (ref 19–32)
Calcium: 9.6 mg/dL (ref 8.4–10.5)
Chloride: 105 mEq/L (ref 96–112)
Creatinine, Ser: 0.92 mg/dL (ref 0.40–1.20)
GFR: 62.97 mL/min (ref >60.00)
Glucose, Bld: 98 mg/dL (ref 70–99)
Potassium: 4.2 mEq/L (ref 3.5–5.1)
Sodium: 137 mEq/L (ref 135–145)
Total Bilirubin: 0.4 mg/dL (ref 0.2–1.2)
Total Protein: 6.8 g/dL (ref 6.0–8.3)

## 2021-04-23 LAB — CBC WITH DIFFERENTIAL/PLATELET
Basophils Absolute: 0.1 10*3/uL (ref 0.0–0.1)
Basophils Relative: 1.2 % (ref 0.0–3.0)
Eosinophils Absolute: 0.3 10*3/uL (ref 0.0–0.7)
Eosinophils Relative: 4 % (ref 0.0–5.0)
HCT: 38.3 % (ref 36.0–46.0)
Hemoglobin: 12.7 g/dL (ref 12.0–15.0)
Lymphocytes Relative: 32.9 % (ref 12.0–46.0)
Lymphs Abs: 2.6 10*3/uL (ref 0.7–4.0)
MCHC: 33.2 g/dL (ref 30.0–36.0)
MCV: 88.6 fl (ref 78.0–100.0)
Monocytes Absolute: 0.7 10*3/uL (ref 0.1–1.0)
Monocytes Relative: 9.5 % (ref 3.0–12.0)
Neutro Abs: 4.1 10*3/uL (ref 1.4–7.7)
Neutrophils Relative %: 52.4 % (ref 43.0–77.0)
Platelets: 381 10*3/uL (ref 150.0–400.0)
RBC: 4.32 Mil/uL (ref 3.87–5.11)
RDW: 14 % (ref 11.5–15.5)
WBC: 7.8 10*3/uL (ref 4.0–10.5)

## 2021-04-23 LAB — LIPID PANEL
Cholesterol: 237 mg/dL — ABNORMAL HIGH (ref 0–200)
HDL: 38.4 mg/dL — ABNORMAL LOW (ref >39.00)
NonHDL: 198.72
Total CHOL/HDL Ratio: 6
Triglycerides: 209 mg/dL — ABNORMAL HIGH (ref 0.0–149.0)
VLDL: 41.8 mg/dL — ABNORMAL HIGH (ref 0.0–40.0)

## 2021-04-23 LAB — LDL CHOLESTEROL, DIRECT: Direct LDL: 168 mg/dL

## 2021-04-30 ENCOUNTER — Encounter: Payer: Self-pay | Admitting: Family Medicine

## 2021-04-30 ENCOUNTER — Other Ambulatory Visit: Payer: Self-pay

## 2021-04-30 ENCOUNTER — Ambulatory Visit (INDEPENDENT_AMBULATORY_CARE_PROVIDER_SITE_OTHER): Payer: Medicare Other | Admitting: Family Medicine

## 2021-04-30 VITALS — BP 128/82 | HR 101 | Temp 98.1°F | Ht 65.0 in | Wt 207.0 lb

## 2021-04-30 DIAGNOSIS — I10 Essential (primary) hypertension: Secondary | ICD-10-CM | POA: Diagnosis not present

## 2021-04-30 DIAGNOSIS — Z0000 Encounter for general adult medical examination without abnormal findings: Secondary | ICD-10-CM

## 2021-04-30 DIAGNOSIS — F17200 Nicotine dependence, unspecified, uncomplicated: Secondary | ICD-10-CM | POA: Diagnosis not present

## 2021-04-30 DIAGNOSIS — Z780 Asymptomatic menopausal state: Secondary | ICD-10-CM

## 2021-04-30 DIAGNOSIS — E785 Hyperlipidemia, unspecified: Secondary | ICD-10-CM | POA: Diagnosis not present

## 2021-04-30 DIAGNOSIS — M549 Dorsalgia, unspecified: Secondary | ICD-10-CM | POA: Diagnosis not present

## 2021-04-30 DIAGNOSIS — Z8719 Personal history of other diseases of the digestive system: Secondary | ICD-10-CM

## 2021-04-30 DIAGNOSIS — Z7189 Other specified counseling: Secondary | ICD-10-CM

## 2021-04-30 DIAGNOSIS — G8929 Other chronic pain: Secondary | ICD-10-CM | POA: Diagnosis not present

## 2021-04-30 DIAGNOSIS — R202 Paresthesia of skin: Secondary | ICD-10-CM | POA: Diagnosis not present

## 2021-04-30 DIAGNOSIS — IMO0001 Reserved for inherently not codable concepts without codable children: Secondary | ICD-10-CM

## 2021-04-30 DIAGNOSIS — Z1231 Encounter for screening mammogram for malignant neoplasm of breast: Secondary | ICD-10-CM

## 2021-04-30 DIAGNOSIS — F39 Unspecified mood [affective] disorder: Secondary | ICD-10-CM

## 2021-04-30 MED ORDER — LOSARTAN POTASSIUM 100 MG PO TABS: 100.0000 mg | ORAL_TABLET | Freq: Every day | ORAL | 3 refills | Status: DC

## 2021-04-30 MED ORDER — ALPRAZOLAM 0.5 MG PO TABS: 0.5000 mg | ORAL_TABLET | Freq: Two times a day (BID) | ORAL | 1 refills | Status: DC | PRN

## 2021-04-30 MED ORDER — FENOFIBRATE 160 MG PO TABS: 160.0000 mg | ORAL_TABLET | Freq: Every day | ORAL | 3 refills | Status: DC

## 2021-04-30 MED ORDER — OMEPRAZOLE 20 MG PO CPDR: 20.0000 mg | DELAYED_RELEASE_CAPSULE | Freq: Every day | ORAL | 3 refills | Status: DC

## 2021-04-30 NOTE — Patient Instructions (Addendum)
You should get a call about the lung cancer screening program and mammogram/DXA.   Take care.  Glad to see you. Call about follow up with Dr. Cruzita Lederer and with Dr. Rush Landmark.  Thanks for your effort to quit smoking.

## 2021-04-30 NOTE — Progress Notes (Signed)
This visit occurred during the SARS-CoV-2 public health emergency.  Safety protocols were in place, including screening questions prior to the visit, additional usage of staff PPE, and extensive cleaning of exam room while observing appropriate contact time as indicated for disinfecting solutions.  I have personally reviewed the Medicare Annual Wellness questionnaire and have noted 1. The patient's medical and social history 2. Their use of alcohol, tobacco or illicit drugs 3. Their current medications and supplements 4. The patient's functional ability including ADL's, fall risks, home safety risks and hearing or visual             impairment. 5. Diet and physical activities 6. Evidence for depression or mood disorders  The patients weight, height, BMI have been recorded in the chart and visual acuity is per eye clinic.  I have made referrals, counseling and provided education to the patient based review of the above and I have provided the pt with a written personalized care plan for preventive services.  Provider list updated- see scanned forms.  Routine anticipatory guidance given to patient.  See health maintenance. The possibility exists that previously documented standard health maintenance information may have been brought forward from a previous encounter into this note.  If needed, that same information has been updated to reflect the current situation based on today's encounter.    Flu encouraged.  Declined at this point Shingles discussed with patient.  Had Zostavax previously. PNA discussed with patient Tetanus 2022 COVID-vaccine encouraged.  Declined at this point. Routine vaccination in general discussed with patient. Colon cancer screening-advised patient to call about follow-up with GI. Breast cancer screening ordered 2022 Bone density test ordered 2022 Advance directive-daughter designated if patient were incapacitated. Cognitive function addressed- see scanned forms- and  if abnormal then additional documentation follows.   In addition to [facility], follow up visit for the below conditions:  Smoking cessation encouraged.  Discussed options.  Intolerant of Chantix.  She can consider various forms of nicotine replacement.  We talked about gum versus patch at the office visit.  Hypertension:    Using medication without problems or lightheadedness: yes Chest pain with exertion:no Edema:no Short of breath: she attributed it to smoking but this is stable per patient report.   Statin intolerant.   History of facial paresthesia with previous evaluation by neurology.  Discussed with patient about getting follow-up imaging as recommended by neurology.  Elevated Cholesterol: Using medications without problems: had been missing doses of fenofibrate.   Muscle aches: no  Diet compliance: she is working on diet more since she saw her labs but not prior, d/w pt.   Exercise: limited by back pain.   Mood d/w pt.  H/o anxiety.  Use xanax daily. No ADE on med.  It helps.  Long standing use.    Taking tramadol for back pain.  She is trying to put off surgery.  Followed by outside clinic.    History of Barrett's esophagus.  Still on PPI.  PMH and SH reviewed  Meds, vitals, and allergies reviewed.   ROS: Per HPI.  Unless specifically indicated otherwise in HPI, the patient denies:  General: fever. Eyes: acute vision changes ENT: sore throat Cardiovascular: chest pain Respiratory: SOB GI: vomiting GU: dysuria Musculoskeletal: acute back pain Derm: acute rash Neuro: acute motor dysfunction Psych: worsening mood Endocrine: polydipsia Heme: bleeding Allergy:  does have eye watering from allergies.    GEN: nad, alert and oriented HEENT: ncat NECK: supple w/o LA CV: rrr. SEM noted.  PULM: ctab, no inc wob ABD: soft, +bs EXT: no edema SKIN: no acute rash

## 2021-05-02 DIAGNOSIS — Z7189 Other specified counseling: Secondary | ICD-10-CM | POA: Insufficient documentation

## 2021-05-02 NOTE — Assessment & Plan Note (Signed)
Continue PPI ?

## 2021-05-02 NOTE — Assessment & Plan Note (Signed)
Needs to continue work on diet and exercise.  Continue losartan

## 2021-05-02 NOTE — Assessment & Plan Note (Signed)
Discussed trying to be as active as possible.  Followed by outside clinic.  She is trying to put off surgery.  I will defer.  She agrees.  Not sedated on tramadol.

## 2021-05-02 NOTE — Assessment & Plan Note (Signed)
Flu encouraged.  Declined at this point Shingles discussed with patient.  Had Zostavax previously. PNA discussed with patient Tetanus 2022 COVID-vaccine encouraged.  Declined at this point. Routine vaccination in general discussed with patient. Colon cancer screening-advised patient to call about follow-up with GI. Breast cancer screening ordered 2022 Bone density test ordered 2022 Advance directive-daughter designated if patient were incapacitated. Cognitive function addressed- see scanned forms- and if abnormal then additional documentation follows.

## 2021-05-02 NOTE — Assessment & Plan Note (Signed)
Advance directive- daughter designated if patient were incapacitated.   

## 2021-05-02 NOTE — Assessment & Plan Note (Signed)
Discussed with patient about getting imaging recommended by neurology.

## 2021-05-02 NOTE — Assessment & Plan Note (Signed)
Labs discussed with patient.  Continue work on diet and exercise.  Continue fenofibrate.

## 2021-05-02 NOTE — Assessment & Plan Note (Signed)
With history of anxiety.  Continue Xanax.  No adverse effect on medication.

## 2021-05-02 NOTE — Assessment & Plan Note (Signed)
Counseled on smoking cessation strategies.  She can use nicotine replacement if needed.  See above.

## 2021-05-16 ENCOUNTER — Emergency Department: Admission: EM | Admit: 2021-05-16 | Payer: Medicare Other | Source: Home / Self Care

## 2021-05-16 ENCOUNTER — Encounter: Payer: Self-pay | Admitting: Emergency Medicine

## 2021-05-16 ENCOUNTER — Other Ambulatory Visit: Payer: Self-pay

## 2021-05-16 NOTE — ED Triage Notes (Signed)
Pt to ED from home [facility]  Hyperglycemia, family called EMS because pt was altered, EMS CBG was 500  Pt has pacemaker, wide complexes on EKG 92% on RA per EMS, placed on 2L then 98%  Dr Tamala Julian at bedside  Pt answering questions appropriately    Both feet appear red, gout flare

## 2021-06-12 ENCOUNTER — Ambulatory Visit
Admission: RE | Admit: 2021-06-12 | Discharge: 2021-06-12 | Disposition: A | Discharge status: Home / Self Care | Payer: Medicare Other | Source: Ambulatory Visit | Attending: Family Medicine | Admitting: Family Medicine

## 2021-06-12 ENCOUNTER — Other Ambulatory Visit: Payer: Self-pay

## 2021-06-12 DIAGNOSIS — Z1231 Encounter for screening mammogram for malignant neoplasm of breast: Secondary | ICD-10-CM | POA: Diagnosis not present

## 2021-07-05 ENCOUNTER — Ambulatory Visit
Admission: RE | Admit: 2021-07-05 | Discharge: 2021-07-05 | Disposition: A | Discharge status: Home / Self Care | Payer: Medicare Other | Source: Ambulatory Visit | Attending: Neurology | Admitting: Neurology

## 2021-07-05 ENCOUNTER — Other Ambulatory Visit: Payer: Self-pay

## 2021-07-05 DIAGNOSIS — I671 Cerebral aneurysm, nonruptured: Secondary | ICD-10-CM | POA: Diagnosis not present

## 2021-07-05 DIAGNOSIS — H93A2 Pulsatile tinnitus, left ear: Secondary | ICD-10-CM

## 2021-07-05 DIAGNOSIS — R200 Anesthesia of skin: Secondary | ICD-10-CM

## 2021-07-05 DIAGNOSIS — R29810 Facial weakness: Secondary | ICD-10-CM | POA: Diagnosis not present

## 2021-07-05 MED ORDER — GADOBENATE DIMEGLUMINE 529 MG/ML IV SOLN
19.0000 mL | Freq: Once | INTRAVENOUS | Status: AC | PRN
  Administered 2021-07-05: 19 mL via INTRAVENOUS

## 2021-07-10 NOTE — Progress Notes (Signed)
Tried calling pt, No answer. LMOVM to call the office back.

## 2021-07-11 ENCOUNTER — Telehealth: Payer: Self-pay

## 2021-07-11 ENCOUNTER — Other Ambulatory Visit: Payer: Self-pay | Admitting: Family Medicine

## 2021-07-11 DIAGNOSIS — I671 Cerebral aneurysm, nonruptured: Secondary | ICD-10-CM

## 2021-07-11 NOTE — Telephone Encounter (Signed)
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  It is in an area that can cause unilateral numbness.  However, it would typically cause unilateral numbness of body, not just the face, so this is likely an incidental finding.  I would recommend taking an aspirin 81mg  daily anyway.  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  It is very small and does not appear to be anything concerning at this time, however it will need to be monitored.  I would like to repeat MRA of head in one year.

## 2021-07-11 NOTE — Telephone Encounter (Signed)
Pt advised of her MRI Brain Results.  MRA recommenced in one year for Aneurysm.

## 2021-07-12 NOTE — Telephone Encounter (Signed)
Refill request for ALPRAZolam (XANAX) 0.5 MG tablet  LOV - 04/30/21 Next OV - not scheduled Last refill - 04/30/21 #60/1

## 2021-07-14 NOTE — Telephone Encounter (Signed)
Sent. Thanks.   

## 2021-08-08 ENCOUNTER — Ambulatory Visit (INDEPENDENT_AMBULATORY_CARE_PROVIDER_SITE_OTHER): Payer: Medicare Other | Admitting: Family Medicine

## 2021-08-08 ENCOUNTER — Encounter: Payer: Self-pay | Admitting: Family Medicine

## 2021-08-08 ENCOUNTER — Other Ambulatory Visit: Payer: Self-pay

## 2021-08-08 DIAGNOSIS — R011 Cardiac murmur, unspecified: Secondary | ICD-10-CM | POA: Diagnosis not present

## 2021-08-08 DIAGNOSIS — E785 Hyperlipidemia, unspecified: Secondary | ICD-10-CM

## 2021-08-08 DIAGNOSIS — F39 Unspecified mood [affective] disorder: Secondary | ICD-10-CM

## 2021-08-08 DIAGNOSIS — Z8673 Personal history of transient ischemic attack (TIA), and cerebral infarction without residual deficits: Secondary | ICD-10-CM | POA: Diagnosis not present

## 2021-08-08 MED ORDER — ESCITALOPRAM OXALATE 10 MG PO TABS: 10.0000 mg | ORAL_TABLET | Freq: Every day | ORAL | 1 refills | Status: DC

## 2021-08-08 NOTE — Patient Instructions (Addendum)
Let me know if you want to talk to the lipid clinic about nonstatin options.    Let me know if I can help with stopping smoking.   Try lexapro 10mg  a day.  If not tolerated then stop it and let me know.  If doing well then let me know in about 4 weeks.  Either way, let me now.    Take care.  Glad to see you.

## 2021-08-08 NOTE — Progress Notes (Signed)
This visit occurred during the SARS-CoV-2 public health emergency.  Safety protocols were in place, including screening questions prior to the visit, additional usage of staff PPE, and extensive cleaning of exam room while observing appropriate contact time as indicated for disinfecting solutions.  MRI results d/w pt.  She is considering quitting smoking, has nicotine patches on order.  Statin intolerant.  She still has L sided facial numbness but not numbness on the body.  Offered lipid clinic eval.  She can consider.    D/w pt about neuro rec to recheck aneurysm in about 1 year. I'll defer to neurology.  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  She is some better with her back but her mood is lower, at least partly from chronic back pain.  No SI/HI.  Was prev on lexapro, in distant past.  She thought it helped, in retrospect.  PHQ score 19.    No CP.  Not SOB.  mild aortic stenosis on last echo.   Meds, vitals, and allergies reviewed.   ROS: Per HPI unless specifically indicated in ROS section   GEN: nad, alert and oriented HEENT:ncat NECK: supple w/o LA CV: rrr.  SEM noted, old finding.  PULM: ctab, no inc wob ABD: soft, +bs EXT: no edema SKIN: well perfused.

## 2021-08-11 DIAGNOSIS — Z8673 Personal history of transient ischemic attack (TIA), and cerebral infarction without residual deficits: Secondary | ICD-10-CM | POA: Insufficient documentation

## 2021-08-11 NOTE — Assessment & Plan Note (Signed)
Discussed smoking cessation and lipid clinic evaluation.

## 2021-08-11 NOTE — Assessment & Plan Note (Signed)
With depressive symptoms exacerbated by her chronic back pain.  Discussed options.  Reasonable to try lexapro 10mg  a day.  If not tolerated then stop it and she can let me know.  If doing well then let me know in about 4 weeks.  Either way, she can update me.  She agrees with plan.  Okay for outpatient follow-up.

## 2021-08-11 NOTE — Assessment & Plan Note (Signed)
See after visit summary.  Offered lipid clinic evaluation.

## 2021-08-11 NOTE — Assessment & Plan Note (Signed)
With aortic stenosis noted on last echo and systolic murmur consistent with that.  Discussed.

## 2021-09-05 ENCOUNTER — Other Ambulatory Visit: Payer: Self-pay | Admitting: Family Medicine

## 2021-09-06 NOTE — Telephone Encounter (Signed)
Refill request for ALPRAZolam 0.5 MG Oral Tablet  LOV - 08/08/21 Next OV - not scheduled Last refill - 07/14/21 #60/1

## 2021-09-07 ENCOUNTER — Encounter: Payer: Self-pay | Admitting: Family Medicine

## 2021-09-08 NOTE — Telephone Encounter (Signed)
Sent. Thanks.   

## 2021-10-08 ENCOUNTER — Encounter: Payer: Self-pay | Admitting: Family Medicine

## 2021-10-10 DIAGNOSIS — M5440 Lumbago with sciatica, unspecified side: Secondary | ICD-10-CM | POA: Diagnosis not present

## 2021-10-10 DIAGNOSIS — I10 Essential (primary) hypertension: Secondary | ICD-10-CM | POA: Diagnosis not present

## 2021-10-17 ENCOUNTER — Ambulatory Visit
Admission: RE | Admit: 2021-10-17 | Discharge: 2021-10-17 | Disposition: A | Discharge status: Home / Self Care | Payer: Medicare Other | Source: Ambulatory Visit | Attending: Family Medicine | Admitting: Family Medicine

## 2021-10-17 ENCOUNTER — Other Ambulatory Visit: Payer: Self-pay

## 2021-10-17 DIAGNOSIS — Z780 Asymptomatic menopausal state: Secondary | ICD-10-CM | POA: Diagnosis not present

## 2021-10-17 DIAGNOSIS — M85852 Other specified disorders of bone density and structure, left thigh: Secondary | ICD-10-CM | POA: Diagnosis not present

## 2021-10-20 ENCOUNTER — Encounter: Payer: Self-pay | Admitting: Family Medicine

## 2021-10-20 DIAGNOSIS — M8580 Other specified disorders of bone density and structure, unspecified site: Secondary | ICD-10-CM | POA: Insufficient documentation

## 2021-11-06 ENCOUNTER — Other Ambulatory Visit: Payer: Self-pay | Admitting: Family Medicine

## 2021-11-07 NOTE — Telephone Encounter (Signed)
Refill request for ALPRAZolam 0.5 MG Oral Tablet ? ?LOV - 08/08/21 ?Next OV - not scheduled ?Last refill - 09/08/21 #60/1 ? ?

## 2021-11-10 NOTE — Telephone Encounter (Signed)
Sent. Thanks.   

## 2021-11-21 DIAGNOSIS — M48062 Spinal stenosis, lumbar region with neurogenic claudication: Secondary | ICD-10-CM | POA: Diagnosis not present

## 2021-12-17 DIAGNOSIS — H1045 Other chronic allergic conjunctivitis: Secondary | ICD-10-CM | POA: Diagnosis not present

## 2021-12-17 DIAGNOSIS — H5789 Other specified disorders of eye and adnexa: Secondary | ICD-10-CM | POA: Diagnosis not present

## 2021-12-18 ENCOUNTER — Other Ambulatory Visit: Payer: Self-pay | Admitting: Neurosurgery

## 2021-12-18 ENCOUNTER — Telehealth: Payer: Self-pay | Admitting: *Deleted

## 2021-12-18 NOTE — Telephone Encounter (Signed)
? ?  Pre-operative Risk Assessment  ?  ?Patient Name: Michelle Park  ?DOB: [birth year] ?MRN: [id number]  ? ?  ? ?Request for Surgical Clearance   ? ?Procedure:   L4-5 LUMBAR FUSION ? ?Date of Surgery:  Clearance 01/22/22                              ?   ?Surgeon:  DR. GARY CRAM ?Surgeon's Group or Practice Name:  [facility] ?Phone number:  [phone] ?Fax number:  [phone] ATTN: VANESSA EXT 244 ?  ?Type of Clearance Requested:   ?- Medical  ASA; (THOUGH NOT LISTED AS NEEDING TO BE HELD) ?  ?Type of Anesthesia:  General  ?  ?Additional requests/questions:   ? ?Signed, ?Julaine Hua   ?12/18/2021, 5:17 PM  ? ?

## 2021-12-19 NOTE — Telephone Encounter (Signed)
   Name: EMAAN GARY  DOB: 01/23/1951  MRN: [id number]  Primary Cardiologist: Jenkins Rouge, MD  Chart reviewed as part of pre-operative protocol coverage. Because of Carrine Kroboth Joe's past medical history and time since last visit, she will require a follow-up in-office visit in order to better assess preoperative cardiovascular risk.  Last echo 12/2020 with mild AS, carotid 01/9793 with moderate LICA stenosis, mild RICA stenosis, and subclavian disturbed flow. Had normal nuc 03/2021. Last seen 02/2021.  By prior recommendations, needs to have yearly echo (dx aortic stenosis) and carotid study (dx carotid artery disease, bilateral) done so would recommend to place order for these and arrange her f/u appointment thereafter, but prior to surgery, to review clearance.   Pre-op covering staff: - Please schedule appointment and call patient to inform them. - Please contact requesting surgeon's office via preferred method (i.e, phone, fax) to inform them of need for appointment prior to surgery.  No specific preliminary contraindication identified to holding ASA if needed but contingent on above studies.  Charlie Pitter, PA-C  12/19/2021, 4:40 PM

## 2021-12-20 NOTE — Telephone Encounter (Signed)
S/w the pt and she is agreeable to plan of care for IN OFFICE appt for pre op clearance. Pt has been scheduled to see Robbie Lis, [facility] 12/26/21 @ 1:55. Will send notes to [facility] for upcoming appt. Will send FYI to requesting office  pt has appt.

## 2021-12-22 ENCOUNTER — Telehealth: Payer: Self-pay | Admitting: Family Medicine

## 2021-12-22 DIAGNOSIS — I6529 Occlusion and stenosis of unspecified carotid artery: Secondary | ICD-10-CM

## 2021-12-22 NOTE — Telephone Encounter (Signed)
Please update patient.  Patient is due for 1 year follow-up of carotid ultrasound.  I put in the order.  Let me know if that is not okay with her.  Otherwise she should get a phone call about scheduling it.  Thanks.

## 2021-12-23 NOTE — Telephone Encounter (Signed)
Left message to return call to our office.  

## 2021-12-25 NOTE — Progress Notes (Unsigned)
Cardiology Office Note:    Date:  12/26/2021   ID:  Michelle Park, DOB [birth year], MRN [id number]  PCP:  Tonia Ghent, MD  [facility] Cardiologist:  Jenkins Rouge, MD  [place] Electrophysiologist:  None   Chief Complaint: Surgical clearance for L4-5 LUMBAR FUSION  History of Present Illness:    Michelle Park is a 71 y.o. female with a hx of syncope likely vegal medicated, aortic stenosis, HTN, GERD, Left carotid stenosis, left ICA aneurysm and tobacco smoking presents for surgical clearance.   Seen at [facility] ED 11/24/20 after dog bite. She had bites in both calves. Went home and had pizza with some dyspnea and dizziness. ? Passing out  " Came to quickly" EMS came and did not note any cardiac issues. She only had a small puncture wound on right calf.  She r/o no acute ECG changes. Reassuring labs.  No history of seizures, TIA, stroke arrhythmia.    F/U echo 12/05/20 reviewed EF 60-65% normal RV mild MR mild AS with mean gradient 10 peak 17 mmHg as well As mild AR AVA 1.6 DVI 0.48    Carotid with 17-51% LICA stenosis.  Established care care with Dr. Johnsie Cancel 02/2021. Had low risk stress test 03/2021. Recommended carotid doppler in one year.   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  Patient is here for surgical clearance.  She is dealing with severe back pain with bilateral sciatic radiation.  Patient has chronic dyspnea which she has attributed to tobacco smoking.  Denies any exertional chest tightness or pressure.  No orthopnea, PND, syncope, lower extremity edema or melena.  She does mild yard work and weed eating without any problem.  Past Medical History:  Diagnosis Date   Allergic rhinitis due to pollen    Anxiety    Bronchitis    hx of   Chronic back pain    COPD (chronic obstructive pulmonary disease) (HCC)    Depression    Dyspnea    with activity only   Episodic mood disorder (HCC)    Fibromyalgia    GERD  (gastroesophageal reflux disease)    Headache    frequent   Heart murmur    Hyperlipidemia    Hypertension    Restless leg syndrome    Spinal stenosis     Past Surgical History:  Procedure Laterality Date   BACK SURGERY     2019 L spine Dr. Saintclair Halsted   ESOPHAGOGASTRODUODENOSCOPY     EYE SURGERY     cataract bil   FOOT SURGERY  1979 and 1988   ingrown toenail     bil great toes   surgery   ROBOTIC ADRENALECTOMY Left 04/01/2019   Procedure: XI ROBOTIC LEFT ADRENALECTOMY;  Surgeon: Ralene Ok, MD;  Location: [facility];  Service: General;  Laterality: Left;   TONSILLECTOMY      Current Medications: Current Meds  Medication Sig   ALPRAZolam (XANAX) 0.5 MG tablet Take 1 tablet by mouth twice daily as needed   aspirin EC 81 MG tablet Take 81 mg by mouth daily. Swallow whole.   calcium-vitamin D (OSCAL WITH D) 500-200 MG-UNIT per tablet Take 1 tablet by mouth 3 (three) times daily with meals. 1500 mg per day for low bone densit   Cholecalciferol (VITAMIN D) 125 MCG (5000 UT) CAPS Take 1 tablet by mouth daily.   escitalopram (LEXAPRO) 10 MG tablet Take 1 tablet (10 mg total) by mouth daily.  fenofibrate 160 MG tablet Take 1 tablet (160 mg total) by mouth daily after supper.   losartan (COZAAR) 100 MG tablet Take 1 tablet (100 mg total) by mouth daily.   Multiple Vitamin (MULTIVITAMIN WITH MINERALS) TABS tablet Take 1 tablet by mouth daily after lunch. Centrum   omeprazole (PRILOSEC) 20 MG capsule Take 1 capsule (20 mg total) by mouth daily.   traMADol (ULTRAM) 50 MG tablet Take 50 mg by mouth every 6 (six) hours as needed.   Zinc 15 MG CAPS Take 1 capsule by mouth daily.     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   1 daughter   Social Determinants of Radio broadcast assistant Strain: Not on file  Food Insecurity: Not on file  Transportation Needs: Not on file  Physical Activity: Not on file  Stress: Not on file  Social Connections: Not on file     Family History: The patient's family history includes Alzheimer's disease in her mother; Cancer in her sister; Heart disease in her mother. There is no history of Colon cancer, Esophageal cancer, Inflammatory bowel disease, Liver disease, Pancreatic cancer, Rectal cancer, Stomach cancer, or Breast cancer.    ROS:   Please see the history of present illness.    All other systems reviewed and are negative.   EKGs/Labs/Other Studies Reviewed:    The following studies were reviewed today:  Stress test 03/2021 The study is normal. The study is low risk.   No ST deviation was noted.   LV perfusion is normal. There is no evidence of ischemia. There is no evidence of infarction.   Nuclear stress EF: 65 %. The left ventricular ejection fraction is normal (55-65%). Left ventricular function is normal. End diastolic cavity size is normal. End systolic cavity size is normal.  Echo 12/2020 1. Left ventricular ejection fraction, by estimation, is 60 to 65%. The  left ventricle has normal function. The left ventricle has no regional  wall motion abnormalities. There is mild left ventricular hypertrophy.   2. Right ventricular systolic function is normal.  The right ventricular  size is normal.   3. Mild mitral valve regurgitation.   4. Aortic valve is thickened, calcified with restricted motion. Peak and  mean gradients through the valve are 17 and 10 mm Hg respectively  consistent with mild aortic stenosis. Aortic valve regurgitation is mild.   5. The inferior vena cava is normal in size with greater than 50%  respiratory variability, suggesting right atrial pressure of 3 mmHg.   Carotid doppler  12/2020 Summary:  Right Carotid: Velocities in the right ICA are consistent with a 1-39%  stenosis.   Left Carotid: Velocities in the left ICA are consistent with a 40-59%  stenosis.                The ECA appears >50% stenosed.   EKG:  EKG is  ordered today.  The ekg ordered today demonstrates normal sinus rhythm  Recent Labs: 04/23/2021: ALT 15; BUN 16; Creatinine, Ser 0.92; Hemoglobin 12.7; Platelets 381.0; Potassium 4.2; Sodium 137  Recent Lipid Panel    Component Value Date/Time   CHOL 237 (H) 04/23/2021 1119   TRIG 209.0 (H) 04/23/2021 1119   HDL 38.40 (L) 04/23/2021 1119   CHOLHDL 6 04/23/2021 1119   VLDL 41.8 (H) 04/23/2021 1119   LDLCALC 70 07/16/2010 0854   LDLDIRECT 168.0 04/23/2021 1119     Physical Exam:    VS:  BP 116/84   Pulse 77   Ht '5\' 5"'$  (1.651 m)   Wt 215 lb 12.8 oz (97.9 kg)   SpO2 97%   BMI 35.91 kg/m     Wt Readings from Last 3 Encounters:  12/26/21 215 lb 12.8 oz (97.9 kg)  08/08/21 211 lb 6.4 oz (95.9 kg)  04/30/21 207 lb (93.9 kg)     GEN:  Well nourished, well developed in no acute distress HEENT: Normal NECK: No JVD; No carotid bruits LYMPHATICS: No lymphadenopathy CARDIAC: RRR, 2/6  murmurs, rubs, gallops RESPIRATORY:  Clear to auscultation without rales, wheezing or rhonchi  ABDOMEN: Soft, non-tender, non-distended MUSCULOSKELETAL:  No edema; No deformity  SKIN: Warm and dry NEUROLOGIC:  Alert and oriented x 3 PSYCHIATRIC:  Normal affect   ASSESSMENT AND PLAN:    Dyspnea on exertion  without chest tightness Patient has chronic dyspnea on exertion which she attributes to ongoing tobacco smoking.  She does not have exertional chest tightness.  Her symptoms are mild.  Given aortic stenosis will update echocardiogram.  2.  Mild aortic stenosis -Will update echocardiogram.   3.  Surgical clearance -Limited activity but able to get at least 4 METS of activity.  Low risk stress test August 2022.  Will update echocardiogram given dyspnea and aortic stenosis.  She has pending carotid Doppler ordered by PCP.  4.  Carotid artery disease -Carotid with 27-25% LICA stenosis 10/6642 -Pending repeat study  5.  Hypertension -Blood pressure stable and controlled on current medications  6.  left ICA aneurysm - Per neurology   7. Tobacco smoking - recommended cessation   Medication Adjustments/Labs and Tests Ordered: Current medicines are reviewed at length with the patient today.  Concerns regarding medicines are outlined above.  Orders Placed This Encounter  Procedures   EKG 12-Lead   ECHOCARDIOGRAM COMPLETE   No orders of the defined types were placed in this encounter.   Patient Instructions  Medication Instructions:  Your physician recommends that you continue on your current medications as directed. Please refer to the Current Medication list given to you today. *If you need a refill on your cardiac medications before your next appointment, please call your pharmacy*   Lab Work: NONE ORDERED   Testing/Procedures: Your physician has requested that you have an echocardiogram. Echocardiography is a painless test that uses sound waves to create images of your heart. It provides your doctor with information about the size and shape of your heart and how well your heart's chambers and valves are working. This procedure takes approximately one hour. There are no restrictions  for this procedure.   Follow-Up: At [facility], you and your health needs are our priority.   As part of our continuing mission to provide you with exceptional heart care, we have created designated Provider Care Teams.  These Care Teams include your primary Cardiologist (physician) and Advanced Practice Providers (APPs -  Physician Assistants and Nurse Practitioners) who all work together to provide you with the care you need, when you need it.  We recommend signing up for the patient portal called "MyChart".  Sign up information is provided on this After Visit Summary.  MyChart is used to connect with patients for Virtual Visits (Telemedicine).  Patients are able to view lab/test results, encounter notes, upcoming appointments, etc.  Non-urgent messages can be sent to your provider as well.   To learn more about what you can do with MyChart, go to [email or web address].    Your next appointment:   6 month(s)  The format for your next appointment:   In Person  Provider:   Jenkins Rouge, MD     Other Instructions   Important Information About Sugar         Jarrett Soho, [place]  12/26/2021 3:28 PM    [place]

## 2021-12-26 ENCOUNTER — Encounter: Payer: Self-pay | Admitting: Physician Assistant

## 2021-12-26 ENCOUNTER — Ambulatory Visit: Payer: Medicare Other | Admitting: Physician Assistant

## 2021-12-26 VITALS — BP 116/84 | HR 77 | Ht 65.0 in | Wt 215.8 lb

## 2021-12-26 DIAGNOSIS — I10 Essential (primary) hypertension: Secondary | ICD-10-CM

## 2021-12-26 DIAGNOSIS — F17200 Nicotine dependence, unspecified, uncomplicated: Secondary | ICD-10-CM

## 2021-12-26 DIAGNOSIS — Z01810 Encounter for preprocedural cardiovascular examination: Secondary | ICD-10-CM | POA: Diagnosis not present

## 2021-12-26 DIAGNOSIS — R0609 Other forms of dyspnea: Secondary | ICD-10-CM

## 2021-12-26 DIAGNOSIS — I350 Nonrheumatic aortic (valve) stenosis: Secondary | ICD-10-CM | POA: Diagnosis not present

## 2021-12-26 NOTE — Patient Instructions (Signed)
Medication Instructions:  Your physician recommends that you continue on your current medications as directed. Please refer to the Current Medication list given to you today. *If you need a refill on your cardiac medications before your next appointment, please call your pharmacy*   Lab Work: NONE ORDERED   Testing/Procedures: Your physician has requested that you have an echocardiogram. Echocardiography is a painless test that uses sound waves to create images of your heart. It provides your doctor with information about the size and shape of your heart and how well your heart's chambers and valves are working. This procedure takes approximately one hour. There are no restrictions for this procedure.   Follow-Up: At [facility], you and your health needs are our priority.  As part of our continuing mission to provide you with exceptional heart care, we have created designated Provider Care Teams.  These Care Teams include your primary Cardiologist (physician) and Advanced Practice Providers (APPs -  Physician Assistants and Nurse Practitioners) who all work together to provide you with the care you need, when you need it.  We recommend signing up for the patient portal called "MyChart".  Sign up information is provided on this After Visit Summary.  MyChart is used to connect with patients for Virtual Visits (Telemedicine).  Patients are able to view lab/test results, encounter notes, upcoming appointments, etc.  Non-urgent messages can be sent to your provider as well.   To learn more about what you can do with MyChart, go to [email or web address].    Your next appointment:   6 month(s)  The format for your next appointment:   In Person  Provider:   Jenkins Rouge, MD     Other Instructions   Important Information About Sugar

## 2021-12-27 NOTE — Telephone Encounter (Signed)
Patient aware of [place] and has already scheduled this for 12/31/21.

## 2021-12-31 ENCOUNTER — Ambulatory Visit
Admission: RE | Admit: 2021-12-31 | Discharge: 2021-12-31 | Disposition: A | Discharge status: Home / Self Care | Payer: Medicare Other | Source: Ambulatory Visit | Attending: Family Medicine | Admitting: Family Medicine

## 2021-12-31 DIAGNOSIS — I6529 Occlusion and stenosis of unspecified carotid artery: Secondary | ICD-10-CM

## 2021-12-31 DIAGNOSIS — I6523 Occlusion and stenosis of bilateral carotid arteries: Secondary | ICD-10-CM | POA: Diagnosis not present

## 2022-01-01 ENCOUNTER — Other Ambulatory Visit: Payer: Self-pay | Admitting: Family Medicine

## 2022-01-01 DIAGNOSIS — I6529 Occlusion and stenosis of unspecified carotid artery: Secondary | ICD-10-CM

## 2022-01-02 ENCOUNTER — Telehealth: Payer: Self-pay | Admitting: Family Medicine

## 2022-01-02 ENCOUNTER — Other Ambulatory Visit: Payer: Self-pay | Admitting: Family Medicine

## 2022-01-02 ENCOUNTER — Telehealth: Payer: Self-pay

## 2022-01-02 DIAGNOSIS — E785 Hyperlipidemia, unspecified: Secondary | ICD-10-CM

## 2022-01-02 NOTE — Telephone Encounter (Signed)
LMTCB

## 2022-01-02 NOTE — Telephone Encounter (Signed)
Hey Vin,   What do I need to do from here? Order a pcsk9?

## 2022-01-02 NOTE — Telephone Encounter (Signed)
Refill request for ALPRAZolam 0.5 MG Oral Tablet  LOV - 08/08/21 Next OV - 04/24/22 Last refill - 11/10/21 #60/1

## 2022-01-02 NOTE — Telephone Encounter (Signed)
-----   Message from Nuala Alpha, LPN sent at 4/0/3474 12:05 PM EDT -----  ----- Message ----- From: Leanor Kail, PA Sent: 01/02/2022  11:50 AM EDT To: Tonia Ghent, MD, [facility] Triage  Given worsening carotid stenosis I I will strongly suggest lipid-lowering therapy.  Since he is intolerant to statin.  She will need referral to lipid clinic for PCSK9 inhibitor. She needs to see vascular surgery prior to surgical clearance. She has pending echocardiogram.  ----- Message ----- From: Tonia Ghent, MD Sent: 01/01/2022   9:46 PM EDT To: Leanor Kail, PA, Filbert Berthold, CMA  Janett Billow- please call pt.  I put in a referral to vascular.  She has progressive left internal carotid artery stenosis compared to prior imaging from 2021.  I want vascular surgery input.  And I routed this to cardiology for input on non-statin lipid lower meds.  I thank all involved.

## 2022-01-02 NOTE — Telephone Encounter (Signed)
Pt stated that someone called her about some test results yesterday and she not sure who it was. Please give pt a call

## 2022-01-03 NOTE — Telephone Encounter (Signed)
Patient is returning a call. °

## 2022-01-03 NOTE — Telephone Encounter (Signed)
Discussed results with patient

## 2022-01-08 ENCOUNTER — Ambulatory Visit (INDEPENDENT_AMBULATORY_CARE_PROVIDER_SITE_OTHER): Payer: Medicare Other

## 2022-01-08 DIAGNOSIS — Z01810 Encounter for preprocedural cardiovascular examination: Secondary | ICD-10-CM | POA: Diagnosis not present

## 2022-01-08 DIAGNOSIS — I350 Nonrheumatic aortic (valve) stenosis: Secondary | ICD-10-CM

## 2022-01-08 LAB — ECHOCARDIOGRAM COMPLETE
AR max vel: 1.79 cm2
AV Area VTI: 1.66 cm2
AV Area mean vel: 1.75 cm2
AV Mean grad: 6 mmHg
AV Peak grad: 10.6 mmHg
AV Vena cont: 0.31 cm
Ao pk vel: 1.63 m/s
Area-P 1/2: 3.81 cm2
Calc EF: 69.5 %
S' Lateral: 2.83 cm
Single Plane A2C EF: 70.1 %
Single Plane A4C EF: 73 %

## 2022-01-10 ENCOUNTER — Other Ambulatory Visit: Payer: Self-pay | Admitting: Student

## 2022-01-10 DIAGNOSIS — M48062 Spinal stenosis, lumbar region with neurogenic claudication: Secondary | ICD-10-CM

## 2022-01-14 ENCOUNTER — Ambulatory Visit: Payer: Medicare Other | Admitting: Pharmacist

## 2022-01-14 ENCOUNTER — Telehealth: Payer: Self-pay | Admitting: Physician Assistant

## 2022-01-14 DIAGNOSIS — Z8673 Personal history of transient ischemic attack (TIA), and cerebral infarction without residual deficits: Secondary | ICD-10-CM | POA: Diagnosis not present

## 2022-01-14 DIAGNOSIS — E785 Hyperlipidemia, unspecified: Secondary | ICD-10-CM

## 2022-01-14 NOTE — Telephone Encounter (Signed)
Patient came in,  inregards to her procedure that Dr. Curly Shores was suppose to follow up with her on pt stated that he followed up with her about her results but he didn't look into the Carotid Artery test results that he said he would.  Best num to reach pt is [phone]

## 2022-01-14 NOTE — Telephone Encounter (Signed)
Called pt in regards to Carotid testing. Advised pt that PCP office has already called pt with results. Pt is requesting that Bhagat, PA take a look at testing and give his opinion.  Advised pt that PCP has already resulted testing.  Pt became upset.  I advised that I would send message to [place], [place] to address.

## 2022-01-14 NOTE — Progress Notes (Signed)
Patient ID: [name]                 DOB: 26-May-1951                    MRN: [id number]     HPI: Michelle Park is a 71 y.o. female patient referred to lipid clinic by Michelle Park in Dr. Kyla Park office. PMH is significant for syncope likely vegal medicated, aortic stenosis, HTN, GERD, Left carotid stenosis, left ICA aneurysm and tobacco smoking. Echo on 12/05/20 showed EF 60-65% with normal RV mild MR mild AS with mean gradient 10 peak 17 mmHg and mild AR AVA 1.6 DVI 0.48. 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 Pt had low risk stress testing performed in 03/2021 at which time a 1 year carotid doppler was recommended. Follow-up carotid doppler showed 70-99% diameter stenosis in the left carotid artery and mild stenosis in the right carotid artery. Pt was scheduled for L4-5 lumbar fusion, which was canceled. Pt was referred to lipid clinic for CV risk factor control in the setting of intolerance to atorvastatin and rosuvastatin.  Pt presents to clinic today for lipid management. Pt had questions regarding cholesterol and recent doppler findings that were answered. Pt reports a history of intolerance to atorvastatin and rosuvastatin due to myalgias and denies attempting any other statins. She does not recall the doses of either medication. Pt reports adherence to fenofibrate 160 mg daily and no adverse reactions. Pt expressed frustration with mobility and exercise being limited by her back pain and hopes to have her procedure soon. Pt had multiple questions about cholesterol and CV risk reduction, which were answered. Pt is motivated to implement lifestyle interventions. Pt previously quit smoking for 3 months using NRT, but started back because her daughter living with her continued to smoke. Pt is interested in improving diet.   Current Medications: fenofibrate 160 mg Intolerances: myalgias - rosuvastatin 5 mg daily, and  atorvastatin Risk Factors: CVA, carotid stenosis LDL goal: <55 mg/dL given CVA and progressive carotid disease  Diet: Morning toast with sugarfree blackberry preserves with butter, lunch generally chicken and salad, bread is weakness  Exercise: Reports pain with walking  Family History: Alzheimer's disease in her mother; Cancer in her sister; Heart disease in her mother. There is no history of Colon cancer, Esophageal cancer, Inflammatory bowel disease, Liver disease, Pancreatic cancer, Rectal cancer, Stomach cancer, or Breast cancer.   Social History: Current smoker, motivated to quit  Labs: Direct LDL 04/23/21 168 Lipid Panel 04/23/2021 TC 237, TG 209, HDL 38.4, VLDL 41.8, NonHDL 198.7  Past Medical History:  Diagnosis Date   Allergic rhinitis due to pollen    Anxiety    Bronchitis    hx of   Chronic back pain    COPD (chronic obstructive pulmonary disease) (HCC)    Depression    Dyspnea    with activity only   Episodic mood disorder (HCC)    Fibromyalgia    GERD (gastroesophageal reflux disease)    Headache    frequent   Heart murmur    Hyperlipidemia    Hypertension    Restless leg syndrome    Spinal stenosis     Current Outpatient Medications on File Prior to Visit  Medication Sig Dispense Refill   ALPRAZolam (XANAX) 0.5 MG tablet Take 1 tablet by mouth twice daily as needed 60 tablet 1  aspirin EC 81 MG tablet Take 81 mg by mouth daily. Swallow whole.     calcium-vitamin D (OSCAL WITH D) 500-200 MG-UNIT per tablet Take 1 tablet by mouth 3 (three) times daily with meals. 1500 mg per day for low bone densit     Cholecalciferol (VITAMIN D) 125 MCG (5000 UT) CAPS Take 1 tablet by mouth daily.     escitalopram (LEXAPRO) 10 MG tablet Take 1 tablet (10 mg total) by mouth daily. 90 tablet 1   fenofibrate 160 MG tablet Take 1 tablet (160 mg total) by mouth daily after supper. 90 tablet 3   losartan (COZAAR) 100 MG tablet Take 1 tablet (100 mg total) by mouth daily. 90  tablet 3   Multiple Vitamin (MULTIVITAMIN WITH MINERALS) TABS tablet Take 1 tablet by mouth daily after lunch. Centrum     omeprazole (PRILOSEC) 20 MG capsule Take 1 capsule (20 mg total) by mouth daily. 90 capsule 3   traMADol (ULTRAM) 50 MG tablet Take 50 mg by mouth every 6 (six) hours as needed.     Zinc 15 MG CAPS Take 1 capsule by mouth daily.     No current facility-administered medications on file prior to visit.    Allergies  Allergen Reactions   Aspirin Other (See Comments)    Tolerated coated aspirin, prev with GI upset/throat irritation with higher dose- not an allergy.     Atorvastatin     REACTION: causes leg pain   Chantix [Varenicline]     GI upset.     Rosuvastatin Other (See Comments)    Myalgias    Assessment/Plan:  1. Hyperlipidemia - LDL of 168 is above goal of <55 given history of CVA with progressive carotid stenosis. Pt is open to risk factor modification to diet and pt was given resources about the mediterranean diet. Pt informed that TG can be significantly reduced with reduction of starches and sugars. Pt understands the importance of lipid reduction and is open to initiation of new medication therapy. Will submit PA for Repatha 140 mg every 2 weeks. Pt was educated on administration, storage, and ADE. Will call patient upon approval and check lipid/hepatic panel in 2 months.  Patient seen with Reatha Harps, PharmD  Ramond Dial, Pharm.D, BCPS, CPP [facility]  [address] [place], [place] [address]  Phone: [phone]; Fax: [phone]

## 2022-01-14 NOTE — Patient Instructions (Addendum)
Last LDL was 168, above goal of at least <70, ideally <55 Triglycerides were 209, goal is <150, diet helps reduce   Medications to research: Repatha or Praluent  I will submit information to your insurance for [place] and let you know when I hear back.    Repatha is a subcutaneous injection given once every 2 weeks in the fatty tissue of your stomach or upper outer thigh. Store the medication in the fridge. You can let your dose warm up to room temperature for 30 minutes before injecting if you prefer. Repatha will lower your LDL cholesterol by 60% and helps to lower your chance of having a heart attack or stroke.  Will call and schedule labs after starting Repatha

## 2022-01-15 ENCOUNTER — Telehealth: Payer: Self-pay | Admitting: Pharmacist

## 2022-01-15 MED ORDER — REPATHA SURECLICK 140 MG/ML ~~LOC~~ SOAJ: 140.0000 mg | SUBCUTANEOUS | 3 refills | Status: DC

## 2022-01-15 NOTE — Telephone Encounter (Signed)
Pt called with news that Repatha PA is approved, but did not answer the phone.

## 2022-01-15 NOTE — Telephone Encounter (Signed)
Left a message to call back.

## 2022-01-16 ENCOUNTER — Encounter: Payer: Self-pay | Admitting: Pharmacist

## 2022-01-16 NOTE — Telephone Encounter (Signed)
Pt called again to notify of Repatha approval with no response. Will try again.

## 2022-01-19 ENCOUNTER — Other Ambulatory Visit: Payer: Self-pay | Admitting: Family Medicine

## 2022-01-21 NOTE — Telephone Encounter (Signed)
Pt was called again today with no response. MyChart message left last week. Pt instructed to reach out to scheduled follow up lipid/hepatic panel in 2 months and reach out with any questions or concerns.

## 2022-01-22 ENCOUNTER — Ambulatory Visit: Admit: 2022-01-22 | Payer: Medicare Other | Admitting: Neurosurgery

## 2022-01-22 SURGERY — POSTERIOR LUMBAR FUSION 1 LEVEL
Anesthesia: General | Site: Back

## 2022-01-30 ENCOUNTER — Encounter: Payer: Self-pay | Admitting: Vascular Surgery

## 2022-01-30 ENCOUNTER — Ambulatory Visit: Payer: Medicare Other | Admitting: Vascular Surgery

## 2022-01-30 ENCOUNTER — Encounter (HOSPITAL_COMMUNITY): Payer: Medicare Other

## 2022-01-30 VITALS — BP 141/73 | HR 75 | Temp 98.2°F | Resp 20 | Ht 65.0 in | Wt 215.3 lb

## 2022-01-30 DIAGNOSIS — I6523 Occlusion and stenosis of bilateral carotid arteries: Secondary | ICD-10-CM | POA: Diagnosis not present

## 2022-01-30 MED ORDER — ROSUVASTATIN CALCIUM 10 MG PO TABS: 10.0000 mg | ORAL_TABLET | Freq: Every day | ORAL | 5 refills | Status: DC

## 2022-01-30 NOTE — Progress Notes (Signed)
ASSESSMENT & PLAN   ASYMPTOMATIC GREATER THAN 70% LEFT CAROTID STENOSIS: This patient has a borderline greater than 70% left carotid stenosis.  Based on the peak systolic velocity the stenosis is likely greater than 80%.  However the end-diastolic velocity is only 90 which would suggest a less than 80% stenosis.  I explained that in a normal risk patient we would consider elective carotid endarterectomy in an asymptomatic patient with a stenosis of greater than 80%.  I have ordered a limited carotid duplex scan of the left side to help determine if the stenosis is greater than 80%.  We also will specifically look for presurgical evaluation anatomy to be sure that the stenosis is surgically accessible and try to avoid a CT angio of the neck.  She could not stay for the study today so we will will arrange this in the near future and I will make further recommendations pending these results.  I have explained that if the stenosis is greater than 80% that I would recommend elective left carotid endarterectomy in order to lower her risk of future stroke. I have reviewed the indications for carotid endarterectomy, that is to lower the risk of future stroke. I have also reviewed the potential complications of surgery, including but not limited to: bleeding, stroke (perioperative risk 1-2%), MI, nerve injury of other unpredictable medical problems. All of the patients questions were answered and they are agreeable to proceed with surgery.  We have also discussed the importance of tobacco cessation.  She is on aspirin.  She was not on a statin because she had some mild myalgias but is willing to try this again.  For this reason I have sent her a prescription for Crestor 10 mg daily.  I will make further recommendations pending the results of her limited carotid duplex scan.  Of note the aneurysm in the left internal carotid artery and the ophthalmic segment may be a contraindication to TCAR.  Thus I would favor  carotid endarterectomy if she needs treatment of this stenosis.   REASON FOR CONSULT:    Carotid disease.  The consult is requested by Dr. Elsie Stain.  HPI:   Michelle Park is a 71 y.o. female who had a known moderate left carotid stenosis which had progressed significantly and for this reason she was sent for vascular consultation.  She tells me that she had a year-long history of some numbness on the left side of the face which ultimately prompted an MRI in December 2022.  This did not show any acute findings.  She did have a 2.5 mm left ICA aneurysm in the ophthalmic segment.  She is left-handed.  She denies any history of stroke, TIAs, expressive or receptive aphasia, or amaurosis fugax.  She was being considered for back surgery which was actually scheduled for 01/22/2022.  However when the stenosis was found this was canceled.  She has seen Dr. Johnsie Cancel for preoperative cardiac evaluation.  I reviewed the note from her most recent visit with them on 12/26/2021.  She had a low risk cardiac study in August 2022.  Her echo in May 2022 showed an ejection fraction of 60 to 65%.  She had some aortic valve thickening.  I do not see a formal clearance for her back surgery from cardiology.  On my history, she denies any recent chest pain.  She does admit to dyspnea on exertion.  Past Medical History:  Diagnosis Date   Allergic rhinitis due to pollen  Anxiety    Bronchitis    hx of   Chronic back pain    COPD (chronic obstructive pulmonary disease) (HCC)    Depression    Dyspnea    with activity only   Episodic mood disorder (HCC)    Fibromyalgia    GERD (gastroesophageal reflux disease)    Headache    frequent   Heart murmur    Hyperlipidemia    Hypertension    Restless leg syndrome    Spinal stenosis     Family History  Problem Relation Age of Onset   Heart disease Mother    Alzheimer's disease Mother    Cancer Sister    Colon cancer Neg Hx    Esophageal cancer Neg Hx     Inflammatory bowel disease Neg Hx    Liver disease Neg Hx    Pancreatic cancer Neg Hx    Rectal cancer Neg Hx    Stomach cancer Neg Hx    Breast cancer Neg Hx     SOCIAL HISTORY: Social History   Tobacco Use   Smoking status: Every Day    Packs/day: 1.00    Years: 51.00    Total pack years: 51.00    Types: Cigarettes    Start date: 21    Passive exposure: Current (Daughter)   Smokeless tobacco: Never  Substance Use Topics   Alcohol use: Not Currently    Alcohol/week: 0.0 standard drinks of alcohol    Comment: rare glass of wine    Allergies  Allergen Reactions   Aspirin Other (See Comments)    Tolerated coated aspirin, prev with GI upset/throat irritation with higher dose- not an allergy.     Atorvastatin     REACTION: causes leg pain   Chantix [Varenicline]     GI upset.     Rosuvastatin Other (See Comments)    Myalgias    Current Outpatient Medications  Medication Sig Dispense Refill   ALPRAZolam (XANAX) 0.5 MG tablet Take 1 tablet by mouth twice daily as needed 60 tablet 1   aspirin EC 81 MG tablet Take 81 mg by mouth daily. Swallow whole.     calcium-vitamin D (OSCAL WITH D) 500-200 MG-UNIT per tablet Take 1 tablet by mouth 3 (three) times daily with meals. 1500 mg per day for low bone densit     Cholecalciferol (VITAMIN D) 125 MCG (5000 UT) CAPS Take 1 tablet by mouth daily.     escitalopram (LEXAPRO) 10 MG tablet Take 1 tablet by mouth once daily 90 tablet 1   fenofibrate 160 MG tablet Take 1 tablet (160 mg total) by mouth daily after supper. 90 tablet 3   losartan (COZAAR) 100 MG tablet Take 1 tablet (100 mg total) by mouth daily. 90 tablet 3   Multiple Vitamin (MULTIVITAMIN WITH MINERALS) TABS tablet Take 1 tablet by mouth daily after lunch. Centrum     omeprazole (PRILOSEC) 20 MG capsule Take 1 capsule (20 mg total) by mouth daily. 90 capsule 3   traMADol (ULTRAM) 50 MG tablet Take 50 mg by mouth every 6 (six) hours as needed.     Zinc 15 MG CAPS Take 1  capsule by mouth daily.     Evolocumab (REPATHA SURECLICK) 644 MG/ML SOAJ Inject 140 mg into the skin every 14 (fourteen) days. (Patient not taking: Reported on 01/30/2022) 6 mL 3   No current facility-administered medications for this visit.    REVIEW OF SYSTEMS:  '[X]'$  denotes positive finding, '[ ]'$  denotes negative  finding Cardiac  Comments:  Chest pain or chest pressure:    Shortness of breath upon exertion: x   Short of breath when lying flat:    Irregular heart rhythm: x       Vascular    Pain in calf, thigh, or hip brought on by ambulation:    Pain in feet at night that wakes you up from your sleep:     Blood clot in your veins:    Leg swelling:         Pulmonary    Oxygen at home:    Productive cough:     Wheezing:         Neurologic    Sudden weakness in arms or legs:     Sudden numbness in arms or legs:     Sudden onset of difficulty speaking or slurred speech:    Temporary loss of vision in one eye:     Problems with dizziness:  x       Gastrointestinal    Blood in stool:     Vomited blood:         Genitourinary    Burning when urinating:     Blood in urine:        Psychiatric    Major depression:         Hematologic    Bleeding problems:    Problems with blood clotting too easily:        Skin    Rashes or ulcers:        Constitutional    Fever or chills:    -  PHYSICAL EXAM:   Vitals:   01/30/22 1329 01/30/22 1338  BP: 138/89 (!) 141/73  Pulse: 75   Resp: 20   Temp: 98.2 F (36.8 C)   TempSrc: Temporal   SpO2: 96%   Weight: 215 lb 4.8 oz (97.7 kg)   Height: '5\' 5"'$  (1.651 m)    Body mass index is 35.83 kg/m. GENERAL: The patient is a well-nourished female, in no acute distress. The vital signs are documented above. CARDIAC: There is a regular rate and rhythm.  VASCULAR: She has bilateral carotid bruits. On the right side, she has a palpable femoral, posterior tibial, and dorsalis pedis pulse. On the left side, she has a palpable femoral,  posterior tibial pulse.  I do not palpate a dorsalis pedis pulse. PULMONARY: There is good air exchange bilaterally without wheezing or rales. ABDOMEN: Soft and non-tender with normal pitched bowel sounds.  MUSCULOSKELETAL: There are no major deformities. NEUROLOGIC: No focal weakness or paresthesias are detected. SKIN: There are no ulcers or rashes noted. PSYCHIATRIC: The patient has a normal affect.  DATA:    CAROTID DUPLEX: I have reviewed the carotid duplex scan that was done elsewhere on 12/31/2021.  On the right side there was a less than 49% stenosis.  The right vertebral artery was patent with antegrade flow.  On the left side there was a greater than 70% stenosis.  Peak systolic velocity was 967 cm/s with an end-diastolic velocity of 90 cm/s.  The left vertebral artery was patent with antegrade flow.  The stenosis on the left had progressed since the study back in 2021 when she had a 40 to 59% left carotid stenosis.  MRI: This patient had an MRI in December 2022 because she had left facial numbness for a year.  This showed a chronic lacunar at the right thalamus.  There was also noted to be a 2.5 mm  left ICA aneurysm in the ophthalmic segment.  Deit[facility]

## 2022-02-05 ENCOUNTER — Other Ambulatory Visit: Payer: Self-pay | Admitting: *Deleted

## 2022-02-05 DIAGNOSIS — I6523 Occlusion and stenosis of bilateral carotid arteries: Secondary | ICD-10-CM

## 2022-02-07 ENCOUNTER — Ambulatory Visit (HOSPITAL_COMMUNITY)
Admission: RE | Admit: 2022-02-07 | Discharge: 2022-02-07 | Disposition: A | Discharge status: Home / Self Care | Payer: Medicare Other | Source: Ambulatory Visit | Attending: Family Medicine | Admitting: Family Medicine

## 2022-02-07 DIAGNOSIS — I6523 Occlusion and stenosis of bilateral carotid arteries: Secondary | ICD-10-CM | POA: Diagnosis not present

## 2022-02-14 ENCOUNTER — Telehealth: Payer: Self-pay

## 2022-02-14 NOTE — Telephone Encounter (Signed)
Pt called stating that she had a carotid US on Friday and had not had anyone call to f/u on the results.  Reviewed pt's chart, returned pt's call, no answer, lf vm stating that Dr Scot Dock was out of the office this week. He will contact her when he is back and acknowledged that she had seen the results in [place].

## 2022-02-26 ENCOUNTER — Other Ambulatory Visit: Payer: Self-pay

## 2022-02-26 DIAGNOSIS — I6523 Occlusion and stenosis of bilateral carotid arteries: Secondary | ICD-10-CM

## 2022-02-27 ENCOUNTER — Other Ambulatory Visit: Payer: Self-pay | Admitting: Family Medicine

## 2022-02-27 NOTE — Telephone Encounter (Signed)
Refill request for ALPRAZolam 0.5 MG Oral Tablet  LOV - 08/08/21 Next OV - 04/24/22 Last refill - 01/05/22 #60/1

## 2022-03-05 ENCOUNTER — Encounter: Payer: Self-pay | Admitting: Internal Medicine

## 2022-03-05 ENCOUNTER — Ambulatory Visit (INDEPENDENT_AMBULATORY_CARE_PROVIDER_SITE_OTHER): Payer: Medicare Other | Admitting: Internal Medicine

## 2022-03-05 DIAGNOSIS — Z91038 Other insect allergy status: Secondary | ICD-10-CM | POA: Diagnosis not present

## 2022-03-05 DIAGNOSIS — S46812A Strain of other muscles, fascia and tendons at shoulder and upper arm level, left arm, initial encounter: Secondary | ICD-10-CM

## 2022-03-05 NOTE — Progress Notes (Signed)
Subjective:    Patient ID: Michelle Park, female    DOB: 12-29-1950, 71 y.o.   MRN: [id number]  HPI Here due to insect sting on nose  Thinks it was a bee--black or brown (nest in breaker box that she had to reset something)--so may be wasp Stung at upper right nose yesterday afternoon Put cool compresses and took 2 benedryl 6pm and bedtime Wonders what else to do Still with swellig  Also having pain in left occiput--to base of skull Current Outpatient Medications on File Prior to Visit  Medication Sig Dispense Refill   ALPRAZolam (XANAX) 0.5 MG tablet Take 1 tablet by mouth twice daily as needed 60 tablet 1   aspirin EC 81 MG tablet Take 81 mg by mouth daily. Swallow whole.     calcium-vitamin D (OSCAL WITH D) 500-200 MG-UNIT per tablet Take 1 tablet by mouth 3 (three) times daily with meals. 1500 mg per day for low bone densit     Cholecalciferol (VITAMIN D) 125 MCG (5000 UT) CAPS Take 1 tablet by mouth daily.     escitalopram (LEXAPRO) 10 MG tablet Take 1 tablet by mouth once daily 90 tablet 1   fenofibrate 160 MG tablet Take 160 mg by mouth daily.     losartan (COZAAR) 100 MG tablet Take 1 tablet (100 mg total) by mouth daily. 90 tablet 3   Multiple Vitamin (MULTIVITAMIN WITH MINERALS) TABS tablet Take 1 tablet by mouth daily after lunch. Centrum     omeprazole (PRILOSEC) 20 MG capsule Take 1 capsule (20 mg total) by mouth daily. 90 capsule 3   traMADol (ULTRAM) 50 MG tablet Take 50 mg by mouth every 6 (six) hours as needed.     Zinc 15 MG CAPS Take 1 capsule by mouth daily.     No current facility-administered medications on file prior to visit.    Allergies  Allergen Reactions   Aspirin Other (See Comments)    Tolerated coated aspirin, prev with GI upset/throat irritation with higher dose- not an allergy.     Atorvastatin     REACTION: causes leg pain   Chantix [Varenicline]     GI upset.     Rosuvastatin Other (See Comments)    Myalgias    Past Medical History:   Diagnosis Date   Allergic rhinitis due to pollen    Anxiety    Bronchitis    hx of   Chronic back pain    COPD (chronic obstructive pulmonary disease) (HCC)    Depression    Dyspnea    with activity only   Episodic mood disorder (HCC)    Fibromyalgia    GERD (gastroesophageal reflux disease)    Headache    frequent   Heart murmur    Hyperlipidemia    Hypertension    Restless leg syndrome    Spinal stenosis     Past Surgical History:  Procedure Laterality Date   BACK SURGERY     2019 L spine Dr. Saintclair Halsted   ESOPHAGOGASTRODUODENOSCOPY     [place] and 1988   ingrown toenail     bil great toes   surgery   ROBOTIC ADRENALECTOMY Left 04/01/2019   Procedure: XI ROBOTIC LEFT ADRENALECTOMY;  Surgeon: Ralene Ok, MD;  Location: [facility];  Service: General;  Laterality: Left;   TONSILLECTOMY      Family History  Problem Relation Age of Onset   Heart disease  Mother    Alzheimer's disease Mother    Cancer Sister    Colon cancer Neg Hx    Esophageal cancer Neg Hx    Inflammatory bowel disease Neg Hx    Liver disease Neg Hx    Pancreatic cancer Neg Hx    Rectal cancer Neg Hx    Stomach cancer Neg Hx    Breast cancer Neg Hx     Social History   Socioeconomic History   Marital status: Divorced    Spouse name: Not on file   Number of children: 1   Years of education: Not on file   Highest education level: Not on file  Occupational History   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   1 daughter   Social Determinants of Radio broadcast assistant Strain: Not on file  Food Insecurity: Not on file  Transportation Needs: Not on file  Physical Activity: Not on file  Stress: Not on file  Social Connections: Not on file  Intimate Partner Violence: Not on file   Review of Systems No fever No mouth lesions--may have brief puffiness in upper lip No breathing problems    Objective:   Physical Exam Neck:     Comments: Tenderness over left trapezius  Mild restriction of ROM in all spheres Skin:    Comments: No findings at bite site but has mild redness and infraorbital swelling ---- left > right            Assessment & Plan:

## 2022-03-05 NOTE — Assessment & Plan Note (Signed)
Mild --likely wasp Discussed cetirizine or fexofenadine Can try cool compresses

## 2022-03-05 NOTE — Assessment & Plan Note (Signed)
Preceded the insect bite Mild findings Discussed trying heat wrap or topical balm

## 2022-03-05 NOTE — Patient Instructions (Signed)
You can try cetirizine '10mg'$  or fexofenadine 180 mg daily for the allergic reaction -----as well as cool compresses. You can still take the benedryl at bedtime

## 2022-03-06 ENCOUNTER — Telehealth: Payer: Self-pay | Admitting: Vascular Surgery

## 2022-03-06 NOTE — Telephone Encounter (Signed)
Spoke with pt and she declined CT scan as well office visit with Dr. Scot Dock.

## 2022-03-07 DIAGNOSIS — I779 Disorder of arteries and arterioles, unspecified: Secondary | ICD-10-CM | POA: Diagnosis not present

## 2022-03-11 ENCOUNTER — Other Ambulatory Visit (HOSPITAL_COMMUNITY): Payer: Medicare Other

## 2022-04-08 DIAGNOSIS — I6523 Occlusion and stenosis of bilateral carotid arteries: Secondary | ICD-10-CM | POA: Diagnosis not present

## 2022-04-08 DIAGNOSIS — I779 Disorder of arteries and arterioles, unspecified: Secondary | ICD-10-CM | POA: Diagnosis not present

## 2022-04-12 ENCOUNTER — Encounter: Payer: Self-pay | Admitting: Family Medicine

## 2022-04-12 DIAGNOSIS — I6529 Occlusion and stenosis of unspecified carotid artery: Secondary | ICD-10-CM | POA: Insufficient documentation

## 2022-04-16 ENCOUNTER — Other Ambulatory Visit: Payer: Self-pay | Admitting: Family Medicine

## 2022-04-16 DIAGNOSIS — I10 Essential (primary) hypertension: Secondary | ICD-10-CM

## 2022-04-16 DIAGNOSIS — M8580 Other specified disorders of bone density and structure, unspecified site: Secondary | ICD-10-CM

## 2022-04-17 ENCOUNTER — Other Ambulatory Visit: Payer: Medicare Other

## 2022-04-23 ENCOUNTER — Ambulatory Visit: Payer: Medicare Other

## 2022-04-24 ENCOUNTER — Encounter: Payer: Medicare Other | Admitting: Family Medicine

## 2022-04-25 ENCOUNTER — Other Ambulatory Visit (INDEPENDENT_AMBULATORY_CARE_PROVIDER_SITE_OTHER): Payer: Medicare Other

## 2022-04-25 DIAGNOSIS — M8580 Other specified disorders of bone density and structure, unspecified site: Secondary | ICD-10-CM | POA: Diagnosis not present

## 2022-04-25 DIAGNOSIS — I10 Essential (primary) hypertension: Secondary | ICD-10-CM

## 2022-04-25 LAB — COMPREHENSIVE METABOLIC PANEL
ALT: 15 U/L (ref 0–35)
AST: 15 U/L (ref 0–37)
Albumin: 4.1 g/dL (ref 3.5–5.2)
Alkaline Phosphatase: 35 U/L — ABNORMAL LOW (ref 39–117)
BUN: 26 mg/dL — ABNORMAL HIGH (ref 6–23)
CO2: 26 mEq/L (ref 19–32)
Calcium: 10.1 mg/dL (ref 8.4–10.5)
Chloride: 100 mEq/L (ref 96–112)
Creatinine, Ser: 1.02 mg/dL (ref 0.40–1.20)
GFR: 55.25 mL/min — ABNORMAL LOW (ref >60.00)
Glucose, Bld: 103 mg/dL — ABNORMAL HIGH (ref 70–99)
Potassium: 4.2 mEq/L (ref 3.5–5.1)
Sodium: 135 mEq/L (ref 135–145)
Total Bilirubin: 0.4 mg/dL (ref 0.2–1.2)
Total Protein: 6.7 g/dL (ref 6.0–8.3)

## 2022-04-25 LAB — CBC WITH DIFFERENTIAL/PLATELET
Basophils Absolute: 0.1 10*3/uL (ref 0.0–0.1)
Basophils Relative: 1.3 % (ref 0.0–3.0)
Eosinophils Absolute: 0.2 10*3/uL (ref 0.0–0.7)
Eosinophils Relative: 2.6 % (ref 0.0–5.0)
HCT: 38.4 % (ref 36.0–46.0)
Hemoglobin: 12.8 g/dL (ref 12.0–15.0)
Lymphocytes Relative: 33.7 % (ref 12.0–46.0)
Lymphs Abs: 2.5 10*3/uL (ref 0.7–4.0)
MCHC: 33.4 g/dL (ref 30.0–36.0)
MCV: 87.9 fl (ref 78.0–100.0)
Monocytes Absolute: 0.7 10*3/uL (ref 0.1–1.0)
Monocytes Relative: 9 % (ref 3.0–12.0)
Neutro Abs: 4 10*3/uL (ref 1.4–7.7)
Neutrophils Relative %: 53.4 % (ref 43.0–77.0)
Platelets: 453 10*3/uL — ABNORMAL HIGH (ref 150.0–400.0)
RBC: 4.37 Mil/uL (ref 3.87–5.11)
RDW: 14.8 % (ref 11.5–15.5)
WBC: 7.5 10*3/uL (ref 4.0–10.5)

## 2022-04-25 LAB — LIPID PANEL
Cholesterol: 202 mg/dL — ABNORMAL HIGH (ref 0–200)
HDL: 49.1 mg/dL (ref >39.00)
LDL Cholesterol: 127 mg/dL — ABNORMAL HIGH (ref 0–99)
NonHDL: 152.74
Total CHOL/HDL Ratio: 4
Triglycerides: 131 mg/dL (ref 0.0–149.0)
VLDL: 26.2 mg/dL (ref 0.0–40.0)

## 2022-04-25 LAB — VITAMIN D 25 HYDROXY (VIT D DEFICIENCY, FRACTURES): VITD: 64.22 ng/mL (ref 30.00–100.00)

## 2022-04-25 LAB — TSH: TSH: 1.71 u[IU]/mL (ref 0.35–5.50)

## 2022-04-28 ENCOUNTER — Other Ambulatory Visit: Payer: Self-pay | Admitting: Family Medicine

## 2022-04-28 NOTE — Telephone Encounter (Signed)
Refill request Alprazolam Last office visit 03/05/22 acute Last refill 02/28/22 #60/1

## 2022-05-01 ENCOUNTER — Ambulatory Visit (INDEPENDENT_AMBULATORY_CARE_PROVIDER_SITE_OTHER): Payer: Medicare Other | Admitting: Family Medicine

## 2022-05-01 ENCOUNTER — Encounter: Payer: Self-pay | Admitting: Family Medicine

## 2022-05-01 VITALS — BP 132/72 | HR 95 | Temp 97.8°F | Ht 65.0 in | Wt 211.0 lb

## 2022-05-01 DIAGNOSIS — Z0000 Encounter for general adult medical examination without abnormal findings: Secondary | ICD-10-CM | POA: Diagnosis not present

## 2022-05-01 DIAGNOSIS — E785 Hyperlipidemia, unspecified: Secondary | ICD-10-CM

## 2022-05-01 DIAGNOSIS — Z7189 Other specified counseling: Secondary | ICD-10-CM

## 2022-05-01 DIAGNOSIS — I6529 Occlusion and stenosis of unspecified carotid artery: Secondary | ICD-10-CM | POA: Diagnosis not present

## 2022-05-01 DIAGNOSIS — I10 Essential (primary) hypertension: Secondary | ICD-10-CM | POA: Diagnosis not present

## 2022-05-01 DIAGNOSIS — F39 Unspecified mood [affective] disorder: Secondary | ICD-10-CM

## 2022-05-01 DIAGNOSIS — F17200 Nicotine dependence, unspecified, uncomplicated: Secondary | ICD-10-CM | POA: Diagnosis not present

## 2022-05-01 MED ORDER — ESCITALOPRAM OXALATE 20 MG PO TABS: 20.0000 mg | ORAL_TABLET | Freq: Every day | ORAL | 1 refills | Status: DC

## 2022-05-01 MED ORDER — LOSARTAN POTASSIUM 100 MG PO TABS: 100.0000 mg | ORAL_TABLET | Freq: Every day | ORAL | 3 refills | Status: DC

## 2022-05-01 NOTE — Patient Instructions (Addendum)
Reasonable to get through the vascular appointment.   Flu shot/GI eval/bone density conversation/consider nonstatin cholesterol med after that.   Take care.  Glad to see you.  Let me know if the higher dose of lexapro isn't helping.

## 2022-05-01 NOTE — Progress Notes (Signed)
I have personally reviewed the Medicare Annual Wellness questionnaire and have noted 1. The patient's medical and social history 2. Their use of alcohol, tobacco or illicit drugs 3. Their current medications and supplements 4. The patient's functional ability including ADL's, fall risks, home safety risks and hearing or visual             impairment. 5. Diet and physical activities 6. Evidence for depression or mood disorders  The patients weight, height, BMI have been recorded in the chart and visual acuity is per eye clinic.  I have made referrals, counseling and provided education to the patient based review of the above and I have provided the pt with a written personalized care plan for preventive services.  Provider list updated- see scanned forms.  Routine anticipatory guidance given to patient.  See health maintenance. The possibility exists that previously documented standard health maintenance information may have been brought forward from a previous encounter into this note.  If needed, that same information has been updated to reflect the current situation based on today's encounter.    Flu encouraged, declined. Shingles discussed with patient, she had Zostavax in 2016. PNA discussed with patient. Tetanus 2022 COVID-vaccine encouraged. Colon cancer screening deferred given ongoing vascular evaluation.  She can address this after she has her carotid evaluation. Breast cancer screening 2022. Bone density test 2023, discussed with patient. Advance directive-daughter designated if patient were incapacitated. Cognitive function addressed- see scanned forms- and if abnormal then additional documentation follows.   In addition to [facility] Wellness, follow up visit for the below conditions:  Hypertension:    Using medication without problems or lightheadedness: no Chest pain with exertion:no Edema:no Short of breath:no Statin intolerant.  D/w pt about options.    Mood d/w pt.   Taking BZD BID with relief.  Still on lexapro.    She had to defer her back tx due to carotid eval.    Smoking cessation d/w pt.    PMH and SH reviewed  Meds, vitals, and allergies reviewed.   ROS: Per HPI.  Unless specifically indicated otherwise in HPI, the patient denies:  General: fever. Eyes: acute vision changes ENT: sore throat Cardiovascular: chest pain Respiratory: SOB GI: vomiting GU: dysuria Musculoskeletal: acute back pain Derm: acute rash Neuro: acute motor dysfunction Psych: worsening mood Endocrine: polydipsia Heme: bleeding Allergy: hayfever  GEN: nad, alert and oriented HEENT: ncat NECK: supple w/o LA, B bruit noted.  CV: rrr. SEM noted at baseline.  PULM: ctab, no inc wob ABD: soft, +bs EXT: no edema SKIN: well perfused.

## 2022-05-02 NOTE — Assessment & Plan Note (Signed)
She is going to follow-up with a vascular clinic.  Routine cautions given to patient.  I will await consult note.

## 2022-05-02 NOTE — Assessment & Plan Note (Signed)
Advance directive- daughter designated if patient were incapacitated.   

## 2022-05-02 NOTE — Assessment & Plan Note (Signed)
Flu encouraged, declined. Shingles discussed with patient, she had Zostavax in 2016. PNA discussed with patient. Tetanus 2022 COVID-vaccine encouraged. Colon cancer screening deferred given ongoing vascular evaluation.  She can address this after she has her carotid evaluation. Breast cancer screening 2022. Bone density test 2023, discussed with patient. Advance directive-daughter designated if patient were incapacitated. Cognitive function addressed- see scanned forms- and if abnormal then additional documentation follows.

## 2022-05-02 NOTE — Assessment & Plan Note (Signed)
Continue fenofibrate.  There are other nonstatin medication she could take.  See after visit summary.  Discussed.

## 2022-05-02 NOTE — Assessment & Plan Note (Signed)
Would continue Lexapro with as needed benzodiazepine.  Routine cautions given to patient.

## 2022-05-02 NOTE — Assessment & Plan Note (Signed)
Smoking cessation d/w pt.

## 2022-05-02 NOTE — Assessment & Plan Note (Signed)
Continue losartan. 

## 2022-06-02 ENCOUNTER — Other Ambulatory Visit: Payer: Self-pay | Admitting: Family Medicine

## 2022-06-23 ENCOUNTER — Other Ambulatory Visit: Payer: Self-pay | Admitting: Family Medicine

## 2022-06-23 ENCOUNTER — Ambulatory Visit: Payer: Medicare Other | Admitting: Cardiovascular Disease

## 2022-06-24 NOTE — Telephone Encounter (Signed)
Last office visit 05/01/22 for [place].  Last refilled 04/29/22 for #60 with 1 refill.  No future appointments.

## 2022-07-17 ENCOUNTER — Telehealth: Payer: Self-pay | Admitting: Pharmacist

## 2022-07-17 NOTE — Telephone Encounter (Signed)
Went to renew PA for Michelle Park. No longer on med list. Called pt to see if she is till taking. LVM for her to call back.

## 2022-07-24 ENCOUNTER — Other Ambulatory Visit: Payer: Self-pay | Admitting: Family Medicine

## 2022-08-01 DIAGNOSIS — H34232 Retinal artery branch occlusion, left eye: Secondary | ICD-10-CM | POA: Diagnosis not present

## 2022-08-01 DIAGNOSIS — Z7982 Long term (current) use of aspirin: Secondary | ICD-10-CM | POA: Diagnosis not present

## 2022-08-01 DIAGNOSIS — I10 Essential (primary) hypertension: Secondary | ICD-10-CM | POA: Diagnosis not present

## 2022-08-01 DIAGNOSIS — I6522 Occlusion and stenosis of left carotid artery: Secondary | ICD-10-CM | POA: Insufficient documentation

## 2022-08-01 DIAGNOSIS — R011 Cardiac murmur, unspecified: Secondary | ICD-10-CM | POA: Diagnosis not present

## 2022-08-01 DIAGNOSIS — K219 Gastro-esophageal reflux disease without esophagitis: Secondary | ICD-10-CM | POA: Diagnosis not present

## 2022-08-01 DIAGNOSIS — F17210 Nicotine dependence, cigarettes, uncomplicated: Secondary | ICD-10-CM | POA: Diagnosis not present

## 2022-08-01 DIAGNOSIS — H04123 Dry eye syndrome of bilateral lacrimal glands: Secondary | ICD-10-CM | POA: Diagnosis not present

## 2022-08-01 DIAGNOSIS — I639 Cerebral infarction, unspecified: Secondary | ICD-10-CM | POA: Diagnosis not present

## 2022-08-01 DIAGNOSIS — H539 Unspecified visual disturbance: Secondary | ICD-10-CM | POA: Diagnosis not present

## 2022-08-01 DIAGNOSIS — H02831 Dermatochalasis of right upper eyelid: Secondary | ICD-10-CM | POA: Diagnosis not present

## 2022-08-01 DIAGNOSIS — R7303 Prediabetes: Secondary | ICD-10-CM | POA: Diagnosis not present

## 2022-08-01 DIAGNOSIS — I6523 Occlusion and stenosis of bilateral carotid arteries: Secondary | ICD-10-CM | POA: Diagnosis not present

## 2022-08-01 DIAGNOSIS — Z79899 Other long term (current) drug therapy: Secondary | ICD-10-CM | POA: Diagnosis not present

## 2022-08-01 DIAGNOSIS — F32A Depression, unspecified: Secondary | ICD-10-CM | POA: Diagnosis not present

## 2022-08-01 DIAGNOSIS — Z961 Presence of intraocular lens: Secondary | ICD-10-CM | POA: Diagnosis not present

## 2022-08-01 DIAGNOSIS — Z720 Tobacco use: Secondary | ICD-10-CM | POA: Diagnosis not present

## 2022-08-01 DIAGNOSIS — H02834 Dermatochalasis of left upper eyelid: Secondary | ICD-10-CM | POA: Diagnosis not present

## 2022-08-01 DIAGNOSIS — H1045 Other chronic allergic conjunctivitis: Secondary | ICD-10-CM | POA: Diagnosis not present

## 2022-08-01 DIAGNOSIS — E785 Hyperlipidemia, unspecified: Secondary | ICD-10-CM | POA: Diagnosis not present

## 2022-08-02 DIAGNOSIS — F17210 Nicotine dependence, cigarettes, uncomplicated: Secondary | ICD-10-CM | POA: Diagnosis not present

## 2022-08-02 DIAGNOSIS — H539 Unspecified visual disturbance: Secondary | ICD-10-CM | POA: Diagnosis not present

## 2022-08-02 DIAGNOSIS — F32A Depression, unspecified: Secondary | ICD-10-CM | POA: Diagnosis not present

## 2022-08-02 DIAGNOSIS — I10 Essential (primary) hypertension: Secondary | ICD-10-CM | POA: Diagnosis not present

## 2022-08-02 DIAGNOSIS — Z79899 Other long term (current) drug therapy: Secondary | ICD-10-CM | POA: Diagnosis not present

## 2022-08-02 DIAGNOSIS — R7303 Prediabetes: Secondary | ICD-10-CM | POA: Diagnosis not present

## 2022-08-02 DIAGNOSIS — H34232 Retinal artery branch occlusion, left eye: Secondary | ICD-10-CM | POA: Diagnosis not present

## 2022-08-02 DIAGNOSIS — Z7982 Long term (current) use of aspirin: Secondary | ICD-10-CM | POA: Diagnosis not present

## 2022-08-02 DIAGNOSIS — K219 Gastro-esophageal reflux disease without esophagitis: Secondary | ICD-10-CM | POA: Diagnosis not present

## 2022-08-02 DIAGNOSIS — E785 Hyperlipidemia, unspecified: Secondary | ICD-10-CM | POA: Diagnosis not present

## 2022-08-02 DIAGNOSIS — R011 Cardiac murmur, unspecified: Secondary | ICD-10-CM | POA: Diagnosis not present

## 2022-08-02 DIAGNOSIS — I6522 Occlusion and stenosis of left carotid artery: Secondary | ICD-10-CM | POA: Diagnosis not present

## 2022-08-06 ENCOUNTER — Telehealth: Payer: Self-pay

## 2022-08-06 ENCOUNTER — Telehealth: Payer: Self-pay | Admitting: *Deleted

## 2022-08-06 DIAGNOSIS — I639 Cerebral infarction, unspecified: Secondary | ICD-10-CM

## 2022-08-06 NOTE — Telephone Encounter (Addendum)
   Telephone encounter was:  Successful.  08/06/2022 Name: WYNELLE DREIER MRN: [id number] DOB: 1951/03/21  CAYLEEN BENJAMIN is a 72 y.o. year old female who is a primary care patient of Tonia Ghent, MD . The community resource team was consulted for assistance with Transportation Needs   Care guide performed the following interventions: Spoke with patient did conference call to [employer] transportation spoke with Tommi Rumps who stated that the patient was out of rides and would need to speak with customer service.  Patient disconnected during the call I attempted to call her again and left a message on voicemail for her to return my call.  Follow Up Plan:   I will attempt to call patient again tomorrow 08/07/22.  [place] Resource Care Guide   [email or web address]  ?? [phone]   Website: [email or web address]  [email or web address]

## 2022-08-06 NOTE — Patient Outreach (Signed)
  Care Coordination [facility] Note Transition Care Management Follow-up Telephone Call Date of discharge and from where: [phone] How have you been since you were released from the hospital? Not really good. My jaw hurts a little and I have some pain in my ears. I had a little cold but it is better.  Any questions or concerns? Yes Per patient The Dr at [facility] said he was ordering a heart ultrasound at [facility] clinic. I checked and they didn't know anything about it. They said everything was completed. I don't have transportation to my local Dr. My reflux is coming back since they stopped my reflux medication at [facility].  Items Reviewed: Did the pt receive and understand the discharge instructions provided? Yes  Medications obtained and verified? Yes  Other? Yes  Patient will talk with Dr Damita Dunnings regarding acid reflux medication Any new allergies since your discharge? No  Dietary orders reviewed? No Do you have support at home? Yes   Home Care and Equipment/Supplies: Were home health services ordered? no If so, what is the name of the agency? N/a  Has the agency set up a time to come to the patient's home? not applicable Were any new equipment or medical supplies ordered?  No What is the name of the medical supply agency? N/a Were you able to get the supplies/equipment? not applicable Do you have any questions related to the use of the equipment or supplies? No  Functional Questionnaire: (I = Independent and D = Dependent) ADLs: I  Bathing/Dressing- I  Meal Prep- I  Eating- I  Maintaining continence- I  Transferring/Ambulation- I  Managing Meds- I  Follow up appointments reviewed:  PCP Hospital f/u appt confirmed? Yes  Dr Damita Dunnings [id number] 12:00 . [facility] f/u appt confirmed? Yes Dr Danelle Berry [phone] 9:30. Are transportation arrangements needed? Yes  If their condition worsens, is the pt aware to call PCP or go to the Emergency Dept.? Yes Was the patient provided  with contact information for the PCP's office or ED? Yes Was to pt encouraged to call back with questions or concerns? 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 RN instructed patient on how to initiate [employer] meal Plan.     Encounter Outcome:  Pt. Visit Completed

## 2022-08-07 ENCOUNTER — Telehealth: Payer: Self-pay

## 2022-08-07 ENCOUNTER — Telehealth: Payer: Self-pay | Admitting: Family Medicine

## 2022-08-07 MED ORDER — LOPERAMIDE HCL 2 MG PO TABS: 2.0000 mg | ORAL_TABLET | Freq: Four times a day (QID) | ORAL | Status: DC | PRN

## 2022-08-07 NOTE — Telephone Encounter (Signed)
   Telephone encounter was:  Unsuccessful.  08/07/2022 Name: Michelle Park MRN: [id number] DOB: 05-10-1951  Unsuccessful outbound call made today to assist with:  Transportation Needs   Outreach Attempt:  2nd Attempt  A HIPAA compliant voice message was left requesting a return call.  Instructed patient to call back at [phone].  [place] Resource Care Guide   ??[name]  ?? [phone]   Website: [email or web address]  [email or web address]

## 2022-08-07 NOTE — Telephone Encounter (Signed)
I wouldn't suspect her diarrhea to be med related.  If fever or passing mucous, needs eval asap.  If no fever and not passing mucous, could try otc imodium prn for diarrhea.    Does the leg pain feel the same as when she was taking atorvastatin?  If so, she could cut crestor in half and see if that helps.   If either sx are getting worse, then needs recheck sooner rather than later.  Thanks.

## 2022-08-07 NOTE — Telephone Encounter (Signed)
Spoke with patient and she states pain is different then when she was on atorvastatin. States this pain goes from her hip down her leg; very painful when she walks on the right side. She has been taking tylenol every 6 hrs but does not help.

## 2022-08-07 NOTE — Telephone Encounter (Signed)
Pt called requesting a call back stated since being home from the hospital she's having Diarrhea don't know if its from the new medication . Also pain in legs and hip . Wants to know if something can be called in for pain . Already have a appointment schedule Please advise # [phone]

## 2022-08-07 NOTE — Telephone Encounter (Signed)
   Telephone encounter was:  Unsuccessful.  08/07/2022 Name: MENNIE SPILLER MRN: [id number] DOB: 1951/02/16  Unsuccessful outbound call made today to assist with:  Transportation Needs   Outreach Attempt:  2nd Attempt  A HIPAA compliant voice message was left requesting a return call.  Instructed patient to call back at [phone].  [place] Resource Care Guide   ??[email or web address]  ?? [phone]   Website: [email or web address]  [email or web address]

## 2022-08-07 NOTE — Addendum Note (Signed)
Addended by: Tonia Ghent on: 08/07/2022 05:07 PM   Modules accepted: Orders

## 2022-08-08 ENCOUNTER — Other Ambulatory Visit: Payer: Self-pay

## 2022-08-08 ENCOUNTER — Telehealth: Payer: Self-pay

## 2022-08-08 MED ORDER — PREDNISONE 10 MG PO TABS: ORAL_TABLET | ORAL | 0 refills | Status: DC

## 2022-08-08 NOTE — Telephone Encounter (Signed)
Does anyone have any open appointments today?  If so, please schedule.  This sounds like sciatica- I would start prednisone in the meantime.  I sent the rx.  If no slots here today/if not better then needs UC eval.  Thanks.

## 2022-08-08 NOTE — Telephone Encounter (Signed)
   Telephone encounter was:  Unsuccessful.  08/08/2022 Name: Michelle Park MRN: [id number] DOB: May 03, 1951  Unsuccessful outbound call made today to assist with:  Transportation Needs   Outreach Attempt:  3rd Attempt.  Referral closed unable to contact patient. Left message on voicemail for patient to return my call regarding transportation for 08/12/22 appointment.  A HIPAA compliant voice message was left requesting a return call.  Instructed patient to call back at [phone].  [place] Resource Care Guide   ??[email or web address]  ?? [phone]   Website: [email or web address]  [email or web address]

## 2022-08-08 NOTE — Addendum Note (Signed)
Addended by: Tonia Ghent on: 08/08/2022 08:05 AM   Modules accepted: Orders

## 2022-08-08 NOTE — Telephone Encounter (Signed)
Called patient and reviewed Dr. Josefine Class message, she will pick up prednisone from the pharmacy. UC precautions were given if she does not improve or worsens. She has appointment with Damita Dunnings on 08/12/22.

## 2022-08-08 NOTE — Telephone Encounter (Signed)
lmtcb

## 2022-08-11 ENCOUNTER — Telehealth: Payer: Self-pay

## 2022-08-11 NOTE — Progress Notes (Addendum)
Opened in error

## 2022-08-12 ENCOUNTER — Encounter: Payer: Self-pay | Admitting: Family Medicine

## 2022-08-12 ENCOUNTER — Ambulatory Visit (INDEPENDENT_AMBULATORY_CARE_PROVIDER_SITE_OTHER): Payer: Medicare Other | Admitting: Family Medicine

## 2022-08-12 VITALS — BP 118/68 | HR 84 | Temp 96.3°F | Ht 65.0 in | Wt 214.0 lb

## 2022-08-12 DIAGNOSIS — I6529 Occlusion and stenosis of unspecified carotid artery: Secondary | ICD-10-CM | POA: Diagnosis not present

## 2022-08-12 DIAGNOSIS — F419 Anxiety disorder, unspecified: Secondary | ICD-10-CM | POA: Diagnosis not present

## 2022-08-12 MED ORDER — PANTOPRAZOLE SODIUM 40 MG PO TBEC: 40.0000 mg | DELAYED_RELEASE_TABLET | Freq: Every day | ORAL | 3 refills | Status: DC

## 2022-08-12 MED ORDER — ALPRAZOLAM 0.5 MG PO TABS: 0.5000 mg | ORAL_TABLET | Freq: Three times a day (TID) | ORAL | 1 refills | Status: DC | PRN

## 2022-08-12 NOTE — Patient Instructions (Addendum)
Stop omeprazole and change to pantoprazole.   Ask the [facility] about getting a repeat echo.  Let me know if they want me to order it.   I sent the xanax refill.  Take care.  Glad to see you. I'll await your notes from [place].

## 2022-08-12 NOTE — Progress Notes (Unsigned)
Summary: Michelle Park is a 72 y.o. female with notable history of known L ICA stenosis, HTN, HLD, and tobacco use admitted to [facility] on 08/01/2022 with Left eye lower visual field loss c/f branch retinal artery occlusion. Today is Hospital Day: 2 with principal problem of Branch retinal artery occlusion of left eye.  Subjective:  Interval History: --Admitted overnight, case discussed with neurology. Patient seen earlier today --Patient reports some improvement in L eye vision. Denies HA or f/c. ROS notable for R shoulder pain --Later RN informed me patient reporting HA, jaw pain, & L shoulder pain   Physical Exam: General: In bed HEENT: PERRL, EOMI, anicteric sclerae CV: RRR no mrg, JVD absent Lungs: Clear Abdomen: SNTND Ext: WWP, no LEE, cap refill <3 Skin: No rashes Neuro: Alert, conversant, appropriate, follows commands. Speaking nondysarthric fluent English. All limb strength at least antigravity. 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 Workup notable for prediabetes (A1c 5.7), LDL 129, MRI brain without acute pathology, & CTA h/n with >70% stenosis of L carotid, high-grade stenosis of R external carotid, & possibly  significant stenoses of innominate artery origin, of L common carotid artery, L subclavian artery. Vascualr surgery consulted, planning for urgent CEA as outpatient. --Neurology consulted, appreciate recs ----Hyperbaric medicine at [facility] deemed her not a candidate. Trial of 15L O2 via NRB without significant improvement ----DAPT for at least 21d ----Stroke order set utilization: tele, neurochecks, patient education, patient tracker, etc ----Permissive HTN with BP monitoring and glucose monitoring for at least 48h ----TTE with bubble ----Visual rehab at discharge. OT recommends no driving until cleared by visual therapist --Resume crestor and start zetia  #Tobacco use - patient declined chantix or bupropion, NRT while here and on dc  #Depression/anxiety - continue SSRI and benzo #GERD- continue PPI  ========================= Not smoking and using a patch, '21mg'$  nicotine.   D/w pt about taking pantoprazole instead of omeprazole with plavix.    She has CEA pending.    Needs echo.  Anxiety d/w pt.    She had mild episodic of nosebleed x1, resolved.  D/w pt.

## 2022-08-13 DIAGNOSIS — R0609 Other forms of dyspnea: Secondary | ICD-10-CM | POA: Insufficient documentation

## 2022-08-13 DIAGNOSIS — I350 Nonrheumatic aortic (valve) stenosis: Secondary | ICD-10-CM | POA: Insufficient documentation

## 2022-08-13 DIAGNOSIS — F419 Anxiety disorder, unspecified: Secondary | ICD-10-CM | POA: Insufficient documentation

## 2022-08-13 DIAGNOSIS — Z87898 Personal history of other specified conditions: Secondary | ICD-10-CM | POA: Insufficient documentation

## 2022-08-13 DIAGNOSIS — R7303 Prediabetes: Secondary | ICD-10-CM | POA: Insufficient documentation

## 2022-08-13 NOTE — Assessment & Plan Note (Signed)
Continue Lexapro with Xanax 3 times daily.  Sedation caution.  Prescription sent.  Okay for outpatient follow-up.

## 2022-08-13 NOTE — Assessment & Plan Note (Signed)
With plan for CEA in the near future.  Continue Plavix.  Change to pantoprazole so she does not have the interaction with omeprazole.  Continue statin.  Rationale discussed with patient.  I asked her to check with the [facility] about getting her echo set up.  She can let me know if I need to order it, if [facility] is not going to address it.  Okay for outpatient follow-up.  Encourage smoking cessation.

## 2022-08-14 ENCOUNTER — Encounter: Payer: Self-pay | Admitting: Nurse Practitioner

## 2022-08-14 ENCOUNTER — Ambulatory Visit (INDEPENDENT_AMBULATORY_CARE_PROVIDER_SITE_OTHER): Payer: Medicare Other | Admitting: Nurse Practitioner

## 2022-08-14 VITALS — BP 128/88 | HR 86 | Temp 98.8°F | Ht 65.0 in | Wt 214.0 lb

## 2022-08-14 DIAGNOSIS — W5501XA Bitten by cat, initial encounter: Secondary | ICD-10-CM

## 2022-08-14 DIAGNOSIS — S61552A Open bite of left wrist, initial encounter: Secondary | ICD-10-CM | POA: Diagnosis not present

## 2022-08-14 MED ORDER — AMOXICILLIN-POT CLAVULANATE 875-125 MG PO TABS: 1.0000 | ORAL_TABLET | Freq: Two times a day (BID) | ORAL | 0 refills | Status: DC

## 2022-08-14 NOTE — Assessment & Plan Note (Signed)
Cat bite that happened 48 hours ago.  Patient's last tetanus shot was in 2022 so up-to-date.  Will start patient on Augmentin 875-125 mg twice daily for 7 days.  Did draw with a skin marker outline borders of redness for patient to monitor antibiotic therapy effectiveness.  She will reach out if she does not improve or the redness starts spreading.  Did offer patient to start that rabies immunoglobulin's and vaccine she politely declined stating "cat does not have rabies" did discuss that if the cat had rabies and she contracts that it is a deadly disease she acknowledged.

## 2022-08-14 NOTE — Patient Instructions (Signed)
Nice to see you today Start the antibiotics Cool compresses to help with swelling and pain. Tylenol as needed for pain  Follow up if no improvement

## 2022-08-14 NOTE — Progress Notes (Signed)
   Acute Office Visit  Subjective:     Patient ID: Michelle Park, female    DOB: [birth year], 72 y.o.   MRN: [id number]  Chief Complaint  Patient presents with   Animal Bite    Cat, Left hand, x48hrs ago; cleaned with soap and water; red/swollen/painful     Patient is in today for Animal bite  States that she was getting the cats bowel on Tuesday and the cat felt cornered and she got attacked. Bitten on the left wrist. States that the cat is feral. States that she does not think he has rabies. Patient is complaining of tenderness and redness. No discharge from the wound   Review of Systems  Constitutional:  Negative for chills and fever.  Skin:        "+" lesion   Neurological:  Negative for weakness.        Objective:    BP 128/88   Pulse 86   Temp 98.8 F (37.1 C) (Oral)   Ht '5\' 5"'$  (1.651 m)   Wt 214 lb (97.1 kg)   SpO2 96%   BMI 35.61 kg/m    Physical Exam Vitals and nursing note reviewed.  Constitutional:      Appearance: Normal appearance.  Cardiovascular:     Rate and Rhythm: Normal rate and regular rhythm.     Heart sounds: Normal heart sounds.  Pulmonary:     Effort: Pulmonary effort is normal.     Breath sounds: Normal breath sounds.  Musculoskeletal:        General: Tenderness present.  Skin:    General: Skin is warm.     Findings: Erythema and lesion present.  Neurological:     Mental Status: She is alert.     No results found for any visits on 08/14/22.      Assessment & Plan:   Problem List Items Addressed This Visit       Other   Cat bite - Primary    Cat bite that happened 48 hours ago.  Patient's last tetanus shot was in 2022 so up-to-date.  Will start patient on Augmentin 875-125 mg twice daily for 7 days.  Did draw with a skin marker outline borders of redness for patient to monitor antibiotic therapy effectiveness.  She will reach out if she does not improve or the redness starts spreading.  Did offer patient to start that  rabies immunoglobulin's and vaccine she politely declined stating "cat does not have rabies" did discuss that if the cat had rabies and she contracts that it is a deadly disease she acknowledged.      Relevant Medications   amoxicillin-clavulanate (AUGMENTIN) 875-125 MG tablet    Meds ordered this encounter  Medications   amoxicillin-clavulanate (AUGMENTIN) 875-125 MG tablet    Sig: Take 1 tablet by mouth 2 (two) times daily for 7 days.    Dispense:  14 tablet    Refill:  0    Order Specific Question:   Supervising Provider    Answer:   TOWER, MARNE A [[id number]    Return if symptoms worsen or fail to improve.  Romilda Garret, NP

## 2022-08-16 ENCOUNTER — Telehealth: Payer: Self-pay | Admitting: Internal Medicine

## 2022-08-16 NOTE — Telephone Encounter (Signed)
This very polite lady called the on-call service as instructed to do or not improving pain in her wrist cat bite for the last couple days despite taking Augmentin.  I reviewed the possible causes of need to go to the emergency room such as any swelling or fluctuance or pus coming out of the wound and she denied that.  Although it can be quite painful she said that she can manage it with Tylenol that she does not need emergency room for pain control and that it is maybe even a little better although still about the same.  She denied any new symptoms such as losing function of her hand.  It seems her concern was mostly to do with an upcoming elective carotid endarterectomy which is fully clogged she reports and has already had 2 strokes.  She asked if she should put it off at night thought maybe if she has active infection should be pushed back but I think if she continues taking the Augmentin and it will slowly get better-I encouraged her that to come into the office on Monday and see her primary care or one of the other providers if she is not able to and will be happy to look at it in person and decide if any changes should be made to the treatment plan

## 2022-08-18 ENCOUNTER — Telehealth: Payer: Self-pay

## 2022-08-18 NOTE — Telephone Encounter (Signed)
I just saw the on call note from the weekend.  I have to defer to the surgery team about the plan to proceed with or delay surgery.  Has she been rechecked in the meantime?  What is her status now?

## 2022-08-18 NOTE — Telephone Encounter (Signed)
Called pt to follow up on Access nurse call from 08/16/22.  Pt reports that hand is no better and would like to see Michelle Park.  No appointments available in practice today offered to look at open appointments at [facility] office.  Pt declined being seen at [facility] office.  She reported that she spoke with a provider from [facility] over the weekend that stated she could be seen in that office today 08/18/22.  Pt was frustrated that her surgery was cancelled and she was not able to be seen by Michelle Park.  Pt hung up on caller before apology could be made.     [place] Night - Client TELEPHONE ADVICE RECORD AccessNurse Patient Name: Michelle Park Gender: Female DOB: Mar 10, 1951 Age: 72 Y 28 D Return Phone Number: [phone] (Primary) Address: City/ State/ Zip: [place] [address] Client [employer] Night - Client Client Site [place] Provider Romilda Garret- NP Contact Type Call Who Is Calling Patient / Member / Family / Caregiver Call Type Triage / Clinical Relationship To Patient Self Return Phone Number [phone] (Primary) Chief Complaint Animal Bite Reason for Call Request to Speak to a Physician Initial Comment Caller states she has an animal bite and was told to call back if it didn't get better. Her hand and fingers are swollen. CBWN: Caller is upset, needs to know if she needs to cancel surgery, stated that she is unable to move hand. No 2nd number, verified primary. Additional Comment No 2nd number. Translation No Nurse Assessment Nurse: Laurance Flatten, RN, Geni Bers Date/Time (Eastern Time): 08/16/2022 5:37:09 PM Confirm and document reason for call. If symptomatic, describe symptoms. ---Caller stated she was seen in the office on Thursday due to an animal bite and was started on an antibiotic and has been taking it. Caller stated she was told to call back if  symptoms to not improve. Caller stated her symptoms have not improved and wants to talk with someone on call. Does the patient have any new or worsening symptoms? ---Yes Will a triage be completed? ---Yes Related visit to physician within the last 2 weeks? ---Yes Does the PT have any chronic conditions? (i.e. diabetes, asthma, this includes High risk factors for pregnancy, etc.) ---Yes List chronic conditions. ---HTN, Carotid artery disease Is this a behavioral health or substance abuse call? ---No Guidelines Guideline Title Affirmed Question Affirmed Notes Nurse Date/Time (Eastern Time) Wound Infection on Antibiotic Follow-up Call SEVERE pain in the wound Laurance Flatten, RN, Geni Bers 08/16/2022 5:41:12 PM PLEASE NOTE: All timestamps contained within this report are represented as [place] Standard Time. CONFIDENTIALTY NOTICE: This fax transmission is intended only for the addressee. It contains information that is legally privileged, confidential or otherwise protected from use or disclosure. If you are not the intended recipient, you are strictly prohibited from reviewing, disclosing, copying using or disseminating any of this information or taking any action in reliance on or regarding this information. If you have received this fax in error, please notify us immediately by telephone so that we can arrange for its return to [place]. Phone: [phone], Toll-Free: [phone], Fax: [phone] Page: 2 of 2 Call Id: [id number] [place]. Time Eilene Ghazi Time) Disposition Final User 08/16/2022 5:23:42 PM Send to [place], RN, Barnetta Chapel 08/16/2022 5:33:07 PM Send To Call Back Waiting For Nurse McGehee, April 08/16/2022 5:34:42 PM Send To Clinical Follow Up Olene Floss, RNLattie Haw 08/16/2022 5:43:50 PM Called On-Call Provider Laurance Flatten, RN, Geni Bers  08/16/2022 5:50:04 PM Go to ED Now Yes Laurance Flatten, RN, Geni Bers 08/16/2022 5:52:14 PM Downgraded Outcome Per Physician Laurance Flatten, RN, Geni Bers Reason:  Physician downgraded Final Disposition 08/16/2022 5:50:04 PM Go to ED Now Yes Laurance Flatten, RN, Althea Grimmer Disagree/Comply Comply Caller Understands Yes PreDisposition InappropriateToAsk Care Advice Given Per Guideline GO TO ED NOW: * You need to be seen in the Emergency Department. * Go to the ED at ___________ [place] now. Drive carefully. PAIN MEDICINES: * For pain relief, you can take either acetaminophen, ibuprofen, or naproxen. * They are over-the-counter (OTC) pain drugs. You can buy them at the drugstore. * ACETAMINOPHEN - REGULAR STRENGTH TYLENOL: Take 650 mg (two 325 mg pills) by mouth every 4 to 6 hours as needed. Each Regular Strength Tylenol pill has 325 mg of acetaminophen. The most you should take is 10 pills a day (3,250 mg total). Note: In [place], the maximum is 12 pills a day (3,900 mg total). * ACETAMINOPHEN - EXTRA STRENGTH TYLENOL: Take 1,000 mg (two 500 mg pills) every 6 to 8 hours as needed. Each Extra Strength Tylenol pill has 500 mg of acetaminophen. The most you should take is 6 pills a day (3,000 mg total). Note: In [place], the maximum is 8 pills a day (4,000 mg total). Referrals GO TO FACILITY UNDECIDED Paging DoctorName Phone DateTime Result/ Outcome Message Type Notes Berniece Pap- MD [phone] 08/16/2022 5:43:50 PM Called On Call Provider - Reached Doctor Paged Berniece Pap- MD 08/16/2022 5:46:18 PM Spoke with On Call - General Message Result Spoke with doctor and he requested warm transfer with the caller.

## 2022-08-19 ENCOUNTER — Encounter (HOSPITAL_BASED_OUTPATIENT_CLINIC_OR_DEPARTMENT_OTHER): Payer: Self-pay | Admitting: Emergency Medicine

## 2022-08-19 ENCOUNTER — Ambulatory Visit (INDEPENDENT_AMBULATORY_CARE_PROVIDER_SITE_OTHER)
Admission: RE | Admit: 2022-08-19 | Discharge: 2022-08-19 | Disposition: A | Discharge status: Home / Self Care | Payer: Medicare Other | Source: Ambulatory Visit | Attending: Family Medicine | Admitting: Family Medicine

## 2022-08-19 ENCOUNTER — Other Ambulatory Visit: Payer: Self-pay

## 2022-08-19 ENCOUNTER — Emergency Department (HOSPITAL_BASED_OUTPATIENT_CLINIC_OR_DEPARTMENT_OTHER)
Admission: EM | Admit: 2022-08-19 | Discharge: 2022-08-19 | Disposition: A | Discharge status: Home / Self Care | Payer: Medicare Other | Attending: Emergency Medicine | Admitting: Emergency Medicine

## 2022-08-19 ENCOUNTER — Ambulatory Visit (INDEPENDENT_AMBULATORY_CARE_PROVIDER_SITE_OTHER): Payer: Medicare Other | Admitting: Family Medicine

## 2022-08-19 ENCOUNTER — Telehealth: Payer: Self-pay | Admitting: Family Medicine

## 2022-08-19 ENCOUNTER — Encounter: Payer: Self-pay | Admitting: Family Medicine

## 2022-08-19 DIAGNOSIS — S61552D Open bite of left wrist, subsequent encounter: Secondary | ICD-10-CM

## 2022-08-19 DIAGNOSIS — L03114 Cellulitis of left upper limb: Secondary | ICD-10-CM | POA: Insufficient documentation

## 2022-08-19 DIAGNOSIS — W5501XD Bitten by cat, subsequent encounter: Secondary | ICD-10-CM

## 2022-08-19 DIAGNOSIS — Z7982 Long term (current) use of aspirin: Secondary | ICD-10-CM | POA: Diagnosis not present

## 2022-08-19 DIAGNOSIS — L03119 Cellulitis of unspecified part of limb: Secondary | ICD-10-CM

## 2022-08-19 DIAGNOSIS — Z7902 Long term (current) use of antithrombotics/antiplatelets: Secondary | ICD-10-CM | POA: Insufficient documentation

## 2022-08-19 DIAGNOSIS — R2232 Localized swelling, mass and lump, left upper limb: Secondary | ICD-10-CM | POA: Diagnosis present

## 2022-08-19 DIAGNOSIS — W5501XA Bitten by cat, initial encounter: Secondary | ICD-10-CM

## 2022-08-19 HISTORY — DX: Cerebral infarction, unspecified: I63.9

## 2022-08-19 LAB — CBC WITH DIFFERENTIAL/PLATELET
Basophils Absolute: 0.1 10*3/uL (ref 0.0–0.1)
Basophils Relative: 0.9 % (ref 0.0–3.0)
Eosinophils Absolute: 0.2 10*3/uL (ref 0.0–0.7)
Eosinophils Relative: 2.2 % (ref 0.0–5.0)
HCT: 35.4 % — ABNORMAL LOW (ref 36.0–46.0)
Hemoglobin: 12 g/dL (ref 12.0–15.0)
Lymphocytes Relative: 23.5 % (ref 12.0–46.0)
Lymphs Abs: 2.7 10*3/uL (ref 0.7–4.0)
MCHC: 33.8 g/dL (ref 30.0–36.0)
MCV: 87.5 fl (ref 78.0–100.0)
Monocytes Absolute: 1 10*3/uL (ref 0.1–1.0)
Monocytes Relative: 8.6 % (ref 3.0–12.0)
Neutro Abs: 7.5 10*3/uL (ref 1.4–7.7)
Neutrophils Relative %: 64.8 % (ref 43.0–77.0)
Platelets: 548 10*3/uL — ABNORMAL HIGH (ref 150.0–400.0)
RBC: 4.05 Mil/uL (ref 3.87–5.11)
RDW: 14.3 % (ref 11.5–15.5)
WBC: 11.5 10*3/uL — ABNORMAL HIGH (ref 4.0–10.5)

## 2022-08-19 MED ORDER — OXYCODONE HCL 5 MG PO TABS: 5.0000 mg | ORAL_TABLET | Freq: Four times a day (QID) | ORAL | 0 refills | Status: DC | PRN

## 2022-08-19 MED ORDER — DOXYCYCLINE HYCLATE 100 MG PO TABS: 100.0000 mg | ORAL_TABLET | Freq: Two times a day (BID) | ORAL | 0 refills | Status: DC

## 2022-08-19 MED ORDER — AMOXICILLIN-POT CLAVULANATE 875-125 MG PO TABS: 1.0000 | ORAL_TABLET | Freq: Two times a day (BID) | ORAL | 0 refills | Status: DC

## 2022-08-19 NOTE — Telephone Encounter (Signed)
If acutely worse then needs to go to ER.

## 2022-08-19 NOTE — Telephone Encounter (Signed)
Patient is coming in for appt with Dr. Damita Dunnings this morning. Will discuss then.

## 2022-08-19 NOTE — ED Notes (Signed)
Pt. Refused to wear BP cuff. Pt. Upset that she hasn't seen a provider yet. Explained to patient reason for waiting.

## 2022-08-19 NOTE — Telephone Encounter (Signed)
Patient called in and stated she seen Dr. Damita Dunnings today for a cat bite. She stated that her hand has gotten worse since she left her appointment. She stated that her hand is swollen all the way down to her fingers. She stated that he wanted her to call if it got it worse. She can be reached at [phone]. Thank you!

## 2022-08-19 NOTE — Patient Instructions (Signed)
Go to the lab on the way out.   If you have mychart we'll likely use that to update you.    Keep taking augmentin and add on doxy at the same time.  If you have a fever or more pain or more swelling/redness, then go to the ER.  Take care.  Glad to see you.

## 2022-08-19 NOTE — Telephone Encounter (Signed)
Spoke with patient and advised to go to the ER. Patient agreed to go to one; she is not sure which one she wants to go to but she will go to one.

## 2022-08-19 NOTE — ED Triage Notes (Addendum)
Left hand swelling, pain. Cat bite to area last week. Worse today. Seen by PCP who advised ed eval for IV abt, (failed oral amox)  Bloodwork and xray done by PCP.

## 2022-08-19 NOTE — Progress Notes (Signed)
F/u for cat bite.  Stray cat.  On abx.  Prev declined rabies treatment.  D/w pt.  Cat is still acting normally per patient report.    L hand still sore, not worse but not resolved.  No known fevers.  Had a sweat last night.  Finger ROM is some better not but not back to normal.    She had to postpone her mother surgery.  It isn't rescheduled yet.    Meds, vitals, and allergies reviewed.   ROS: Per HPI unless specifically indicated in ROS section   Nad Ncat She has puffiness/tenderness on the dorsum of the left hand but not on the palmar side.  She has intact distal range of motion, fingers are distally neurovascularly intact.  She does not have spreading erythema tracking up the arm or down the fingers.

## 2022-08-19 NOTE — ED Provider Notes (Signed)
[facility] EMERGENCY DEPT Provider Note   CSN: [id number] Arrival date & time: 08/19/22  1745     History  Chief Complaint  Patient presents with   Hand Problem    Michelle Park is a 71 y.o. female.  Patient is a 72 year old female presenting for swelling and redness of the left hand.  Patient was previously diagnosed with left hand cellulitis by her primary care team and started on amoxicillin.  Patient states she is taking amoxicillin for 5 days with no improvement of symptoms.  States that she saw the physician today and was sent home with doxycycline and has taken 1 tablet.  Patient admits to worsening swelling and pain in the left hand.  Initial wound was a puncture wound from a cat on the dorsum wrist.  The history is provided by the patient. No language interpreter was used.       Home Medications Prior to Admission medications   Medication Sig Start Date End Date Taking? Authorizing Provider  acetaminophen (TYLENOL) 325 MG tablet Take 325 mg by mouth. 08/02/22   [provider]  ALPRAZolam (XANAX) 0.5 MG tablet Take 1 tablet (0.5 mg total) by mouth 3 (three) times daily as needed. 08/12/22   Tonia Ghent, MD  amoxicillin-clavulanate (AUGMENTIN) 875-125 MG tablet Take 1 tablet by mouth 2 (two) times daily. 08/19/22   Tonia Ghent, MD  aspirin EC 81 MG tablet Take 81 mg by mouth daily. Swallow whole.    [provider]  calcium-vitamin D (OSCAL WITH D) 500-200 MG-UNIT per tablet Take 1 tablet by mouth 3 (three) times daily with meals. 1500 mg per day for low bone densit    [provider]  clopidogrel (PLAVIX) 75 MG tablet Take 75 mg by mouth daily. 08/03/22 08/03/23  [provider]  doxycycline (VIBRA-TABS) 100 MG tablet Take 1 tablet (100 mg total) by mouth 2 (two) times daily. 08/19/22   Tonia Ghent, MD  escitalopram (LEXAPRO) 20 MG tablet Take 1 tablet (20 mg total) by mouth daily. 05/01/22   Tonia Ghent, MD   ezetimibe (ZETIA) 10 MG tablet Take 1 tablet by mouth daily. 08/03/22 08/03/23  [provider]  fenofibrate 160 MG tablet TAKE 1 TABLET BY MOUTH ONCE DAILY AFTER SUPPER 06/02/22   Tonia Ghent, MD  losartan (COZAAR) 100 MG tablet Take 1 tablet (100 mg total) by mouth daily. 05/01/22   Tonia Ghent, MD  Multiple Vitamin (MULTIVITAMIN WITH MINERALS) TABS tablet Take 1 tablet by mouth daily after lunch. Centrum    [provider]  nicotine (NICODERM CQ - DOSED IN MG/24 HOURS) 21 mg/24hr patch Place onto the skin. 08/03/22   [provider]  oxyCODONE (ROXICODONE) 5 MG immediate release tablet Take 1 tablet (5 mg total) by mouth every 6 (six) hours as needed for up to 6 doses for severe pain. 8/67/61   Campbell Stall P, DO  pantoprazole (PROTONIX) 40 MG tablet Take 1 tablet (40 mg total) by mouth daily. 08/12/22   Tonia Ghent, MD  rosuvastatin (CRESTOR) 5 MG tablet Take 1 tablet by mouth at bedtime. 08/02/22 08/02/23  [provider]      Allergies    Aspirin, Atorvastatin, and Chantix [varenicline]    Review of Systems   Review of Systems  Constitutional:  Negative for chills and fever.  HENT:  Negative for ear pain and sore throat.   Eyes:  Negative for pain and visual disturbance.  Respiratory:  Negative for cough and shortness of breath.   Cardiovascular:  Negative for chest pain and palpitations.  Gastrointestinal:  Negative for abdominal pain and vomiting.  Genitourinary:  Negative for dysuria and hematuria.  Musculoskeletal:  Negative for arthralgias and back pain.  Skin:  Negative for color change and rash.  Neurological:  Negative for seizures and syncope.  All other systems reviewed and are negative.   Physical Exam Updated Vital Signs BP 125/64 (BP Location: Right Arm)   Pulse 72   Temp 97.6 F (36.4 C)   Resp 18   SpO2 100%  Physical Exam Vitals and nursing note reviewed.  Constitutional:      General: She is not in acute  distress.    Appearance: She is well-developed.  HENT:     Head: Normocephalic and atraumatic.  Eyes:     Conjunctiva/sclera: Conjunctivae normal.  Cardiovascular:     Rate and Rhythm: Normal rate and regular rhythm.     Heart sounds: No murmur heard. Pulmonary:     Effort: Pulmonary effort is normal. No respiratory distress.     Breath sounds: Normal breath sounds.  Abdominal:     Palpations: Abdomen is soft.     Tenderness: There is no abdominal tenderness.  Musculoskeletal:        General: No swelling.     Cervical back: Neck supple.  Skin:    General: Skin is warm and dry.     Capillary Refill: Capillary refill takes less than 2 seconds.     Findings: Rash present.     Comments: Erythema, swelling, and warmth of the left hand extending from the distal fingers to the left wrist.  Less than 1 cm superficial skin wound likely from cat bite puncture.  No purulent drainage.  Neurological:     Mental Status: She is alert.  Psychiatric:        Mood and Affect: Mood normal.     ED Results / Procedures / Treatments   Labs (all labs ordered are listed, but only abnormal results are displayed) Labs Reviewed - No data to display  EKG None  Radiology No results found.  Procedures Procedures    Medications Ordered in ED Medications - No data to display  ED Course/ Medical Decision Making/ A&P                             Medical Decision Making Risk Prescription drug management.   72 year old female presenting for swelling and redness of the left hand.  Patient is alert and oriented x 3, no acute distress, afebrile, stable vital signs.  Physical exam demonstrates cellulitis of the left hand extending from the fingers extending to the wrist.  No signs of tenosynovitis.  Patient has range of motion and sensation and motor function intact.  Soft compartments. Pulses intact.    Chart review demonstrates patient has completed course of amoxicillin and was started on  doxycycline but has only taken 1 dose.  Interventions: Patient offered admission for failed outpatient therapy of cellulitis and treatment with IV antibiotics but declined at this time.  Recommend patient continues to take doxycycline for total of 24 to 48 hours along with elevation, ice, and pain control.  Recommend return to emergency department immediately if symptoms worsen in any way or if symptoms do not begin to improve after 24 to 48 hours of antibiotic therapy.  Patient agreeable to plan.   Patient in no distress and  overall condition improved here in the ED. Detailed discussions were had with the patient regarding current findings, and need for close f/u with PCP or on call doctor. The patient has been instructed to return immediately if the symptoms worsen in any way for re-evaluation. Patient verbalized understanding and is in agreement with current care plan. All questions answered prior to discharge.        Final Clinical Impression(s) / ED Diagnoses Final diagnoses:  Cellulitis of hand  Cat bite, initial encounter    Rx / DC Orders ED Discharge Orders          Ordered    oxyCODONE (ROXICODONE) 5 MG immediate release tablet  Every 6 hours PRN,   Status:  Discontinued        08/19/22 2033    oxyCODONE (ROXICODONE) 5 MG immediate release tablet  Every 6 hours PRN        08/19/22 2033              Campbell Stall P, DO 58/85/02 [phone]

## 2022-08-19 NOTE — Discharge Instructions (Signed)
Return to emergency department immediately for any worsening concerning signs or symptoms including worsening of swelling, pain in the fingers, or progression of redness.   Continue taking doxycycline.

## 2022-08-20 NOTE — Assessment & Plan Note (Signed)
Keep taking augmentin and add on doxy at the same time.  If a fever or more pain or more swelling/redness, then go to the ER.  See notes on labs and imaging.

## 2022-08-26 ENCOUNTER — Encounter: Payer: Self-pay | Admitting: Family Medicine

## 2022-08-26 ENCOUNTER — Telehealth: Payer: Self-pay | Admitting: *Deleted

## 2022-08-26 ENCOUNTER — Telehealth: Payer: Self-pay

## 2022-08-26 ENCOUNTER — Ambulatory Visit (INDEPENDENT_AMBULATORY_CARE_PROVIDER_SITE_OTHER): Payer: Medicare Other | Admitting: Family Medicine

## 2022-08-26 VITALS — BP 130/81 | HR 77 | Temp 98.0°F | Resp 16 | Ht 65.0 in | Wt 214.0 lb

## 2022-08-26 DIAGNOSIS — L03114 Cellulitis of left upper limb: Secondary | ICD-10-CM

## 2022-08-26 DIAGNOSIS — I10 Essential (primary) hypertension: Secondary | ICD-10-CM

## 2022-08-26 DIAGNOSIS — W5501XD Bitten by cat, subsequent encounter: Secondary | ICD-10-CM

## 2022-08-26 NOTE — Patient Instructions (Signed)
Continue losartan 1./2 tablet of 100 mg daily.  Elevated left hand above heart for swelling.  Complete antibiotics.  Keep follow up with ID.

## 2022-08-26 NOTE — Patient Outreach (Signed)
  Care Coordination TOC Note Transition Care Management Follow-up Telephone Call Date of discharge and from where: [facility] [phone] cellulitis of hand How have you been since you were released from the hospital? Doing better Any questions or concerns? No  Items Reviewed: Did the pt receive and understand the discharge instructions provided? Yes  Medications obtained and verified? No The pharmacy will be getting the medication in today. They were out of it.  Other? No  Any new allergies since your discharge? No  Dietary orders reviewed? No Do you have support at home? Yes   Home Care and Equipment/Supplies: Were home health services ordered? not applicable If so, what is the name of the agency? n  Has the agency set up a time to come to the patient's home? not applicable Were any new equipment or medical supplies ordered?  No What is the name of the medical supply agency? n Were you able to get the supplies/equipment? not applicable Do you have any questions related to the use of the equipment or supplies? No  Functional Questionnaire: (I = Independent and D = Dependent) ADLs: I  Bathing/Dressing- I  Meal Prep- I  Eating- I  Maintaining continence- I  Transferring/Ambulation- I  Managing Meds- I  Follow up appointments reviewed:  PCP Hospital f/u appt confirmed? Yes  Scheduled to see Dr Diona Browner  on 08/26/2022 2:00 . [facility] f/u appt confirmed? Yes  Scheduled to see Dr Darcus Austin [phone]  11:30  infectious disease Are transportation arrangements needed? No  If their condition worsens, is the pt aware to call PCP or go to the Emergency Dept.? Yes Was the patient provided with contact information for the PCP's office or ED? Yes Was to pt encouraged to call back with questions or concerns? Yes  SDOH assessments and interventions completed:   Yes SDOH Interventions Today    Flowsheet Row Most Recent Value  SDOH Interventions   Food Insecurity Interventions  Intervention Not Indicated  Housing Interventions Intervention Not Indicated  Transportation Interventions Intervention Not Indicated  [facility] transportation]       Care Coordination Interventions:  RN discussed [employer] meal Plan    Encounter Outcome:  Pt. Visit Completed    [place] [facility] [phone]

## 2022-08-26 NOTE — Assessment & Plan Note (Signed)
With associated cellulitis.  Tolerating antibiotics. Encouraged completion of antibiotics.  Did not check cbc today as too early  Has follow up with ID 2/5.

## 2022-08-26 NOTE — Telephone Encounter (Signed)
[facility] Night - Client Nonclinical Telephone Record  [facility] Client [facility] Night - Client Client Site [facility] - Night Provider Renford Dills - MD Contact Type Call Who Is Calling Patient / Member / Family / Caregiver Caller Name [name] [facility]) [place] Phone Number [phone] Patient Name Michelle Park [facility]) Patient DOB 09/25/1950 Call Type Message Only Information Provided Reason for Call Request to Schedule Office Appointment Initial Comment Caller wants an appt for a BP check and discuss medications. Patient request to speak to RN No Additional Comment provided hours Disp. Time Disposition Final User 08/26/2022 7:54:49 AM General Information Provided Yes Bettye Boeck Call Closed By: Bettye Boeck Transaction Date/Time: 08/26/2022 7:52:43 AM (ET

## 2022-08-26 NOTE — Telephone Encounter (Signed)
Patient has been scheduled

## 2022-08-26 NOTE — Assessment & Plan Note (Addendum)
Chronic  Lower BPs while in hospital... held losartan now BP well controlled back on losartan but at 1/2  100 mg tablet daily.  Follow up with PCP in 1 week for BP recheck to see if she can restart full tablet losartan. ( She does not have money to buy home cuff)

## 2022-08-26 NOTE — Progress Notes (Signed)
Patient ID: Michelle Park, female    DOB: 03/10/1951, 72 y.o.   MRN: [id number]  This visit was conducted in person.  BP 130/81   Pulse 77   Temp 98 F (36.7 C)   Resp 16   Ht '5\' 5"'$  (1.651 m)   Wt 214 lb (97.1 kg)   SpO2 99%   BMI 35.61 kg/m    CC:  Chief Complaint  Patient presents with   Blood Pressure Check    Subjective:   HPI: Michelle Park is a 72 y.o. female patient of Dr. Damita Dunnings with history of hypertension, COPD, carotid stenosis, aortic sclerosis, generalized anxiety, tobacco abuse, prediabetes and hyperlipidemia presenting on 08/26/2022 for Blood Pressure Check  Note she had a transitional care call today following hospital admission for cellulitis of hand following cat bite.  It looks like they made the hospital follow-up visit with me today.  Cat bite was 08/12/2021. Initially started on Augmentin when saw Michelle Park on 08/14/2022. On 1/16 she was seen by her PCP Dr. Damita Dunnings and antibiotics were broadened to doxycycline given she had continued symptoms. She was seen on 1/16 at [facility] ED for worsening pain and swelling in her left hand.  At that time per the note she declined IV antibiotics, they recommended to continue doxycycline and pain control with oxycodone.  She was admitted to [facility] on August 22, 2022 until discharge on August 24, 2022 Treated with 14-day course of Moxifloxacin  and doxycycline, had follow-up set up with infectious disease. Of note she missed carotid surgery because of the cellulitis and plans to follow-up with vascular surgery. Recommended follow-up CBC 1 to 2 weeks following discharge to reevaluate anemia.  Today, She reports she  had noted low blood pressures.  She has not bee able to check as she does snot have a monitor.  Swelling has improved. Pain improved. No fever. Has follow up with ID 09/08/2021.  No fever.  No flu like symptoms.  Hypertension:  Well-controlled in office today ... She had held meds for 3 days and  today took  1/2 tablet ( 50 mg ) losartan 100 mg p.o. daily BP Readings from Last 3 Encounters:  08/26/22 130/81  08/19/22 125/64  08/19/22 110/80  Using medication without problems or lightheadedness:  none Chest pain with exertion:none Edema:none Short of breath:none Average home BPs: Other issues:      Relevant past medical, surgical, family and social history reviewed and updated as indicated. Interim medical history since our last visit reviewed. Allergies and medications reviewed and updated. Outpatient Medications Prior to Visit  Medication Sig Dispense Refill   acetaminophen (TYLENOL) 325 MG tablet Take 325 mg by mouth.     ALPRAZolam (XANAX) 0.5 MG tablet Take 1 tablet (0.5 mg total) by mouth 3 (three) times daily as needed. 60 tablet 1   aspirin EC 81 MG tablet Take 81 mg by mouth daily. Swallow whole.     calcium-vitamin D (OSCAL WITH D) 500-200 MG-UNIT per tablet Take 1 tablet by mouth 3 (three) times daily with meals. 1500 mg per day for low bone densit     cholecalciferol (VITAMIN D3) 25 MCG (1000 UNIT) tablet Take 1,000 Units by mouth daily.     clopidogrel (PLAVIX) 75 MG tablet Take 75 mg by mouth daily.     doxycycline (VIBRA-TABS) 100 MG tablet Take 1 tablet (100 mg total) by mouth 2 (two) times daily. 14 tablet 0   escitalopram (LEXAPRO) 20 MG tablet  Take 1 tablet (20 mg total) by mouth daily. 90 tablet 1   ezetimibe (ZETIA) 10 MG tablet Take 1 tablet by mouth daily.     fenofibrate 160 MG tablet TAKE 1 TABLET BY MOUTH ONCE DAILY AFTER SUPPER 90 tablet 2   losartan (COZAAR) 100 MG tablet Take 1 tablet (100 mg total) by mouth daily. 90 tablet 3   moxifloxacin (AVELOX) 400 MG tablet Take by mouth.     Multiple Vitamin (MULTIVITAMIN WITH MINERALS) TABS tablet Take 1 tablet by mouth daily after lunch. Centrum     nicotine (NICODERM CQ - DOSED IN MG/24 HOURS) 21 mg/24hr patch Place onto the skin.     pantoprazole (PROTONIX) 40 MG tablet Take 1 tablet (40 mg total) by  mouth daily. 30 tablet 3   rosuvastatin (CRESTOR) 5 MG tablet Take 1 tablet by mouth at bedtime.     oxyCODONE (ROXICODONE) 5 MG immediate release tablet Take 1 tablet (5 mg total) by mouth every 6 (six) hours as needed for up to 6 doses for severe pain. 6 tablet 0   amoxicillin-clavulanate (AUGMENTIN) 875-125 MG tablet Take 1 tablet by mouth 2 (two) times daily. 10 tablet 0   No facility-administered medications prior to visit.     Per HPI unless specifically indicated in ROS section below Review of Systems  Constitutional:  Negative for fatigue and fever.  HENT:  Negative for congestion.   Eyes:  Negative for pain.  Respiratory:  Negative for cough and shortness of breath.   Cardiovascular:  Negative for chest pain, palpitations and leg swelling.  Gastrointestinal:  Negative for abdominal pain.  Genitourinary:  Negative for dysuria and vaginal bleeding.  Musculoskeletal:  Negative for back pain.  Neurological:  Negative for syncope, light-headedness and headaches.  Psychiatric/Behavioral:  Negative for dysphoric mood.    Objective:  BP 130/81   Pulse 77   Temp 98 F (36.7 C)   Resp 16   Ht '5\' 5"'$  (1.651 m)   Wt 214 lb (97.1 kg)   SpO2 99%   BMI 35.61 kg/m   Wt Readings from Last 3 Encounters:  08/26/22 214 lb (97.1 kg)  08/19/22 214 lb (97.1 kg)  08/14/22 214 lb (97.1 kg)      Physical Exam Constitutional:      General: She is not in acute distress.    Appearance: Normal appearance. She is well-developed. She is not ill-appearing or toxic-appearing.  HENT:     Head: Normocephalic.     Right Ear: Hearing, tympanic membrane, ear canal and external ear normal. Tympanic membrane is not erythematous, retracted or bulging.     Left Ear: Hearing, tympanic membrane, ear canal and external ear normal. Tympanic membrane is not erythematous, retracted or bulging.     Nose: No mucosal edema or rhinorrhea.     Right Sinus: No maxillary sinus tenderness or frontal sinus tenderness.      Left Sinus: No maxillary sinus tenderness or frontal sinus tenderness.     Mouth/Throat:     Pharynx: Uvula midline.  Eyes:     General: Lids are normal. Lids are everted, no foreign bodies appreciated.     Conjunctiva/sclera: Conjunctivae normal.     Pupils: Pupils are equal, round, and reactive to light.  Neck:     Thyroid: No thyroid mass or thyromegaly.     Vascular: No carotid bruit.     Trachea: Trachea normal.  Cardiovascular:     Rate and Rhythm: Normal rate and regular rhythm.  Pulses: Normal pulses.     Heart sounds: S1 normal and S2 normal. Murmur heard.     Systolic murmur is present.     No friction rub. No gallop.  Pulmonary:     Effort: Pulmonary effort is normal. No tachypnea or respiratory distress.     Breath sounds: Normal breath sounds. No decreased breath sounds, wheezing, rhonchi or rales.  Abdominal:     General: Bowel sounds are normal.     Palpations: Abdomen is soft.     Tenderness: There is no abdominal tenderness.  Musculoskeletal:     Cervical back: Normal range of motion and neck supple.     Right lower leg: No edema.     Left lower leg: No edema.  Skin:    General: Skin is warm and dry.     Findings: No rash.  Neurological:     Mental Status: She is alert.  Psychiatric:        Mood and Affect: Mood is not anxious or depressed.        Speech: Speech normal.        Behavior: Behavior normal. Behavior is cooperative.        Thought Content: Thought content normal.        Judgment: Judgment normal.       Results for orders placed or performed in visit on 08/19/22  CBC with Differential/Platelet  Result Value Ref Range   WBC 11.5 (H) 4.0 - 10.5 K/uL   RBC 4.05 3.87 - 5.11 Mil/uL   Hemoglobin 12.0 12.0 - 15.0 g/dL   HCT 35.4 (L) 36.0 - 46.0 %   MCV 87.5 78.0 - 100.0 fl   MCHC 33.8 30.0 - 36.0 g/dL   RDW 14.3 11.5 - 15.5 %   Platelets 548.0 (H) 150.0 - 400.0 K/uL   Neutrophils Relative % 64.8 43.0 - 77.0 %   Lymphocytes Relative  23.5 12.0 - 46.0 %   Monocytes Relative 8.6 3.0 - 12.0 %   Eosinophils Relative 2.2 0.0 - 5.0 %   Basophils Relative 0.9 0.0 - 3.0 %   Neutro Abs 7.5 1.4 - 7.7 K/uL   Lymphs Abs 2.7 0.7 - 4.0 K/uL   Monocytes Absolute 1.0 0.1 - 1.0 K/uL   Eosinophils Absolute 0.2 0.0 - 0.7 K/uL   Basophils Absolute 0.1 0.0 - 0.1 K/uL    Assessment and Plan  Primary hypertension Assessment & Plan: Chronic  Lower BPs while in hospital... held losartan now BP well controlled back on losartan but at 1/2  100 mg tablet daily.  Follow up with PCP in 1 week for BP recheck to see if she can restart full tablet losartan. ( She does not have money to buy home cuff)    Cat bite, subsequent encounter Assessment & Plan: With associated cellulitis.  Tolerating antibiotics. Encouraged completion of antibiotics.  Did not check cbc today as too early  Has follow up with ID 2/5.   Cellulitis of left hand    Return in about 1 week (around 09/02/2022) for  BP and cellulitis follow up with PCP.Marland Kitchen   Eliezer Lofts, MD

## 2022-08-27 ENCOUNTER — Telehealth: Payer: Self-pay | Admitting: Family Medicine

## 2022-08-27 NOTE — Telephone Encounter (Signed)
Please make sure patient has inpatient f/u after admission for cellulitis.  Thanks.

## 2022-08-28 ENCOUNTER — Telehealth: Payer: Self-pay | Admitting: Family Medicine

## 2022-08-28 NOTE — Telephone Encounter (Signed)
If more pain/worse/spreading redness in spite of antibiotics, then rec ER evalm now.  Thanks.

## 2022-08-28 NOTE — Telephone Encounter (Signed)
Patient was already seen by Dr. Diona Browner on 08/26/22 and has another appt with you on 09/02/22

## 2022-08-28 NOTE — Telephone Encounter (Signed)
Noted. Thanks. I see that now.

## 2022-08-28 NOTE — Telephone Encounter (Signed)
Called patient reviewed she declined to go to ED. She states that she just wanted to know if there was something she could do at home. Advised patient multiple times Dr. Damita Dunnings recommendations. She continued to she she has been to ED and on two antibiotics and she was not going to emergency room. In attempt to wrap up call advised that I would send message to Dr. Damita Dunnings that information given and patient refused ED. Patient became agitated and states "don't you do that, don't tell him I refuse Ed. " Advised patient that I have to document what information I have given and patient response. Was there anything she would like to add to message. Patient continued to tell me "don't you tell him I'm not going to ED." Again informed patient that I have to document our conversation and the outcome. I have also let patient know all red words. Advised patient that if any questions to reach out to our office but Dr. Josefine Class recommendation is to be seen at ED. Patient was in agreement to this and call was ended.

## 2022-08-28 NOTE — Telephone Encounter (Signed)
Patient called in and stated that she is experiencing left hand joint pain and stated that it is painful to bend. She wants to know what she should do now. She stated she is taking Tylenol. Please advise. Thank you!

## 2022-08-29 IMAGING — MG MM DIGITAL SCREENING BILAT W/ TOMO AND CAD
8 series · 8 of 24 positions shown · non-contrast
Comparison: Previous exam(s).

CLINICAL DATA: Screening.

EXAM:
DIGITAL SCREENING BILATERAL MAMMOGRAM WITH TOMOSYNTHESIS AND CAD
TECHNIQUE: Bilateral screening digital craniocaudal and mediolateral oblique
mammograms were obtained. Bilateral screening digital breast
tomosynthesis was performed. The images were evaluated with
computer-aided detection.

[R MLO synth-2D]
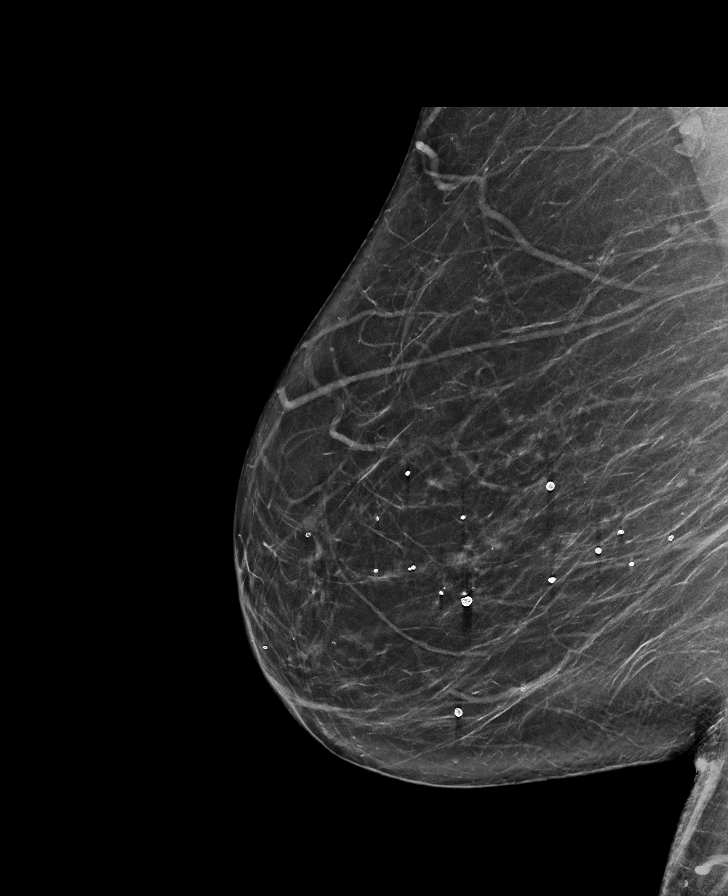

[L CC synth-2D]
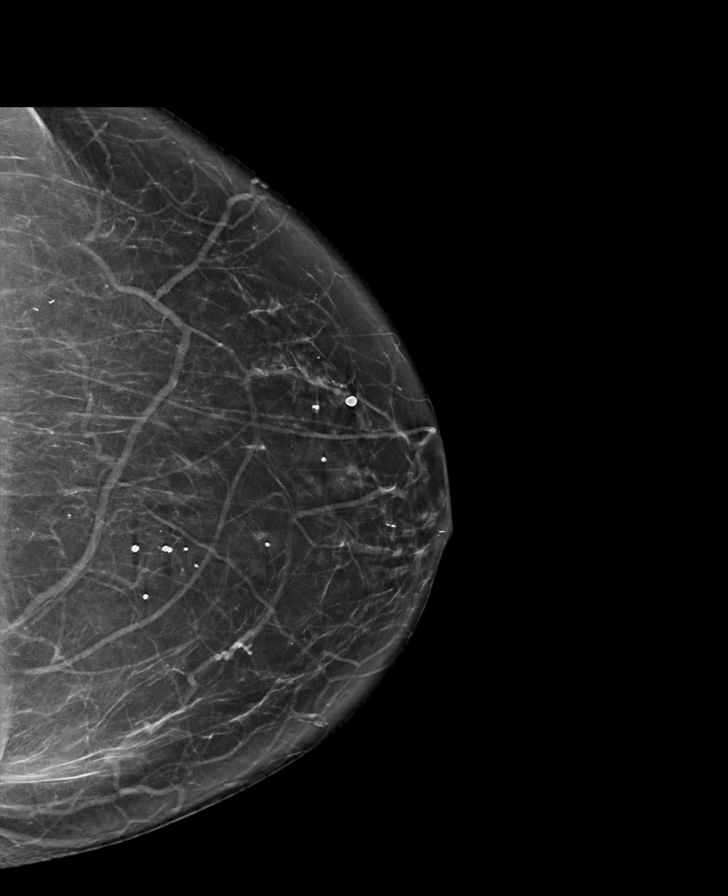

[R CC synth-2D]
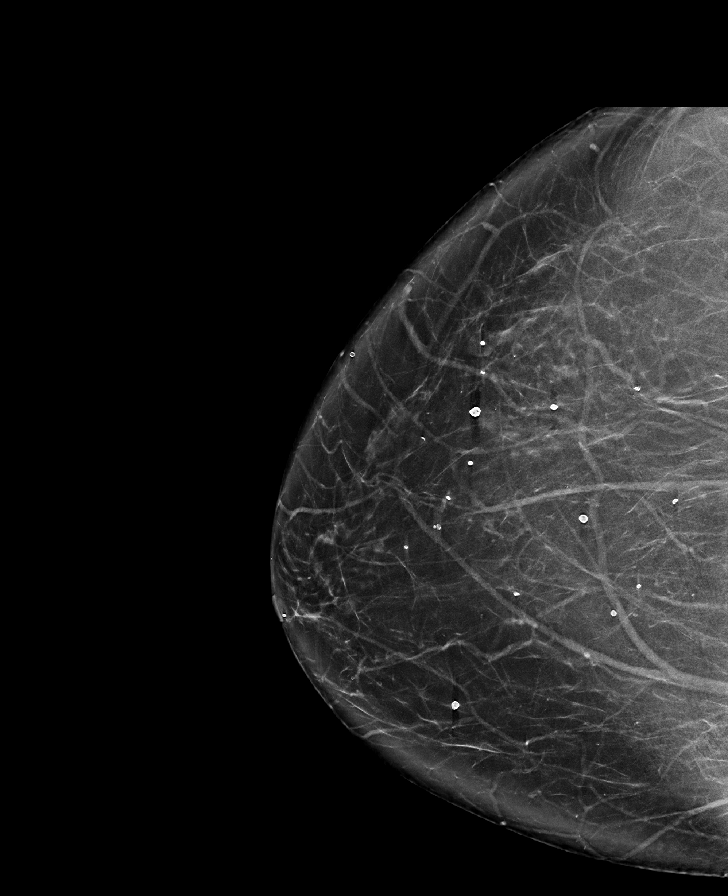

[L MLO synth-2D]
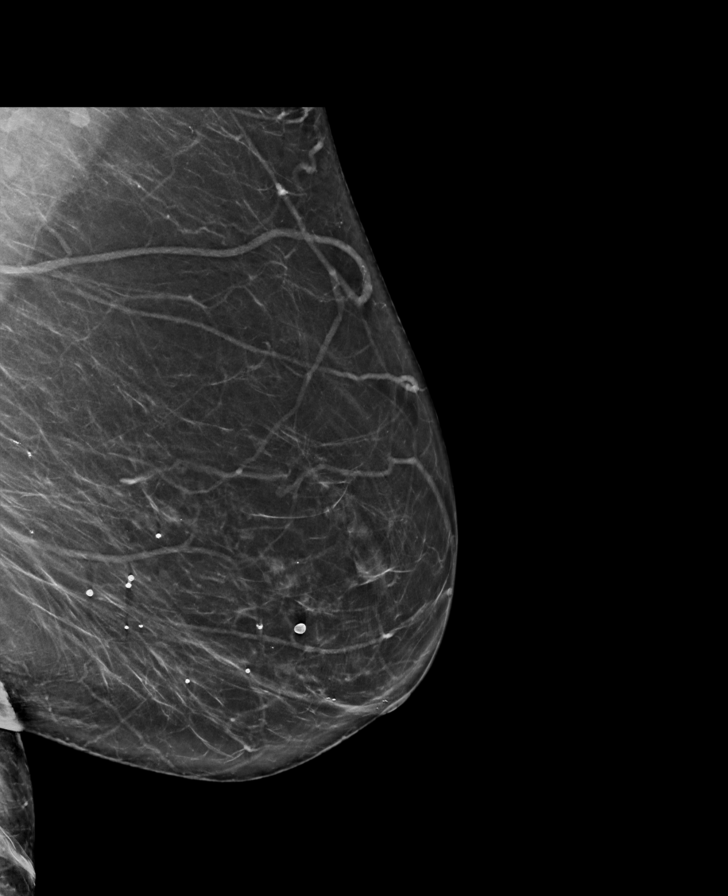

[R MLO tomo · tomo slice 38/75.0]
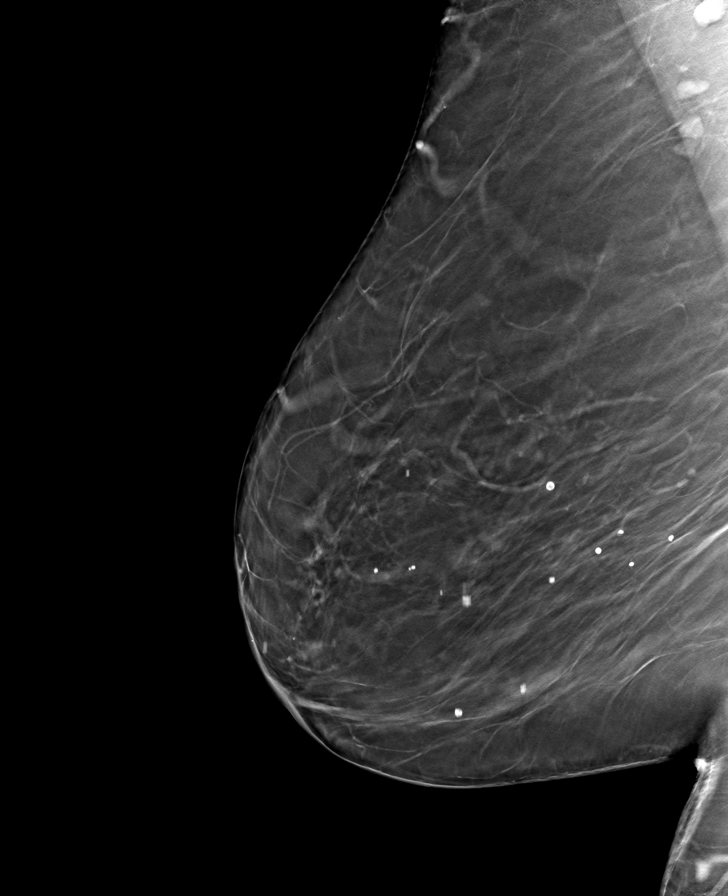

[L MLO tomo · tomo slice 39/76.0]
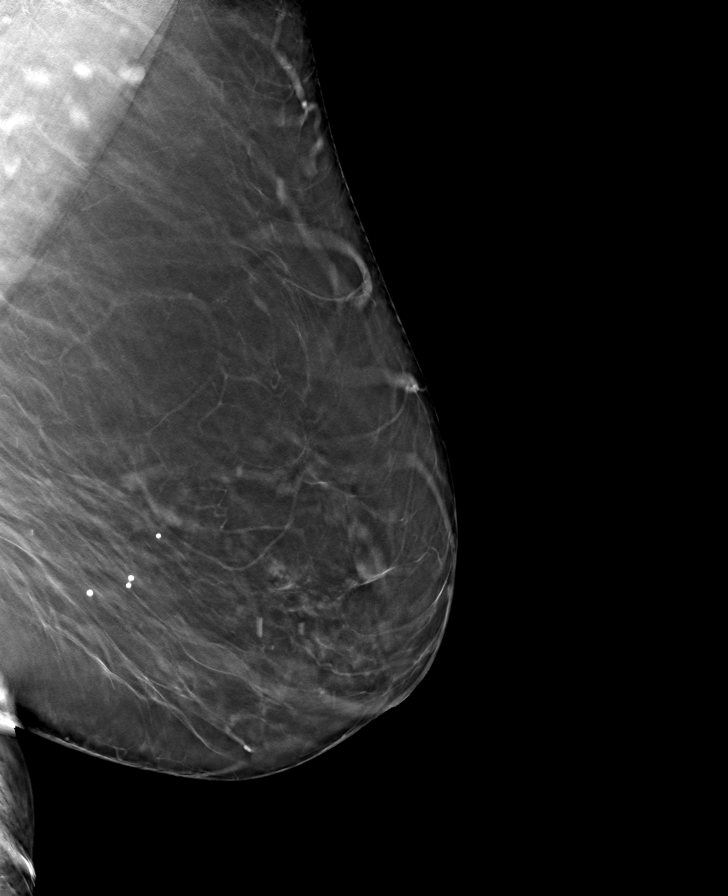

[R CC tomo · tomo slice 37/73.0]
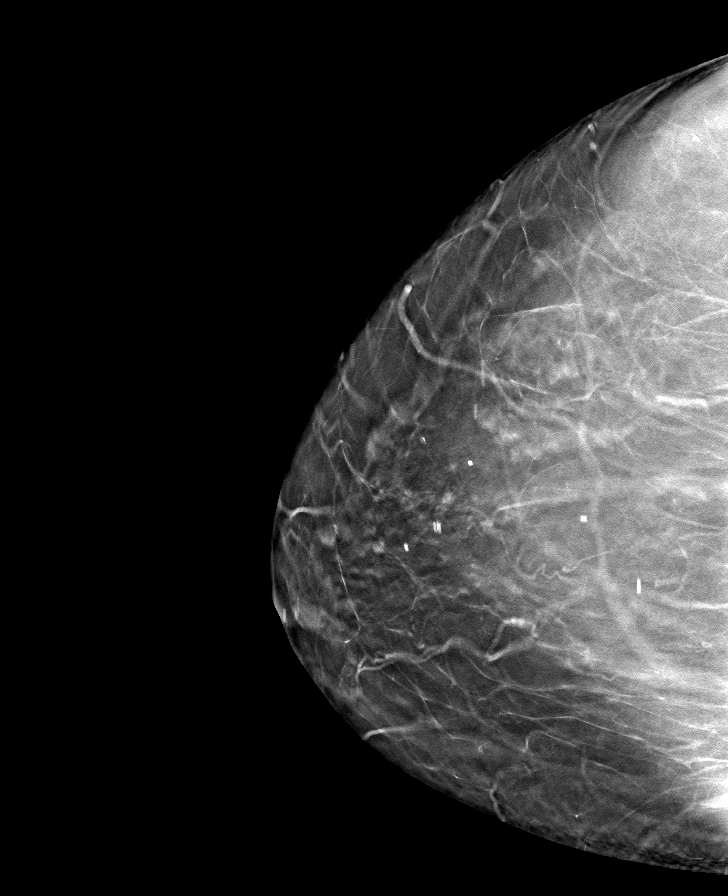

[L CC tomo · tomo slice 35/68.0]
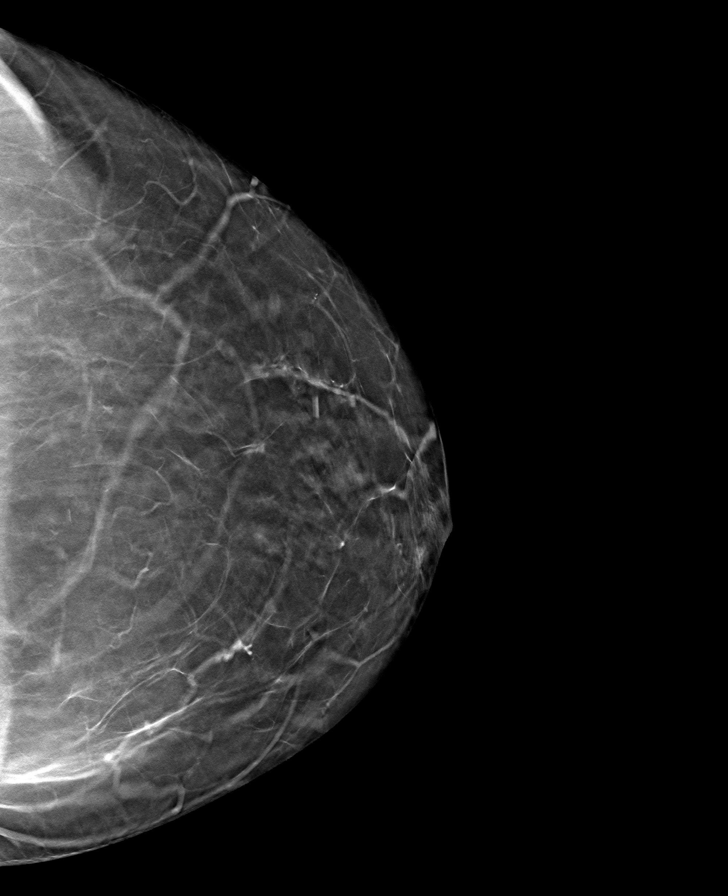

[8 of 24 positions shown; findings below may reference images not displayed]

ACR Breast Density Category b: There are scattered areas of
fibroglandular density.
FINDINGS: There are no findings suspicious for malignancy.
IMPRESSION: No mammographic evidence of malignancy. A result letter of this
screening mammogram will be mailed directly to the patient.

RECOMMENDATION:
Screening mammogram in one year. (Code:51-O-LD2)

BI-RADS CATEGORY  1: Negative.

## 2022-08-29 NOTE — Telephone Encounter (Signed)
Noted.  I'll defer to patient.

## 2022-09-02 ENCOUNTER — Ambulatory Visit (INDEPENDENT_AMBULATORY_CARE_PROVIDER_SITE_OTHER): Payer: Medicare Other | Admitting: Internal Medicine

## 2022-09-02 ENCOUNTER — Ambulatory Visit: Payer: Medicare Other | Admitting: Family Medicine

## 2022-09-02 ENCOUNTER — Encounter: Payer: Self-pay | Admitting: Internal Medicine

## 2022-09-02 VITALS — BP 126/80 | HR 70 | Temp 97.8°F | Ht 65.0 in | Wt 211.0 lb

## 2022-09-02 DIAGNOSIS — I10 Essential (primary) hypertension: Secondary | ICD-10-CM

## 2022-09-02 DIAGNOSIS — L03114 Cellulitis of left upper limb: Secondary | ICD-10-CM | POA: Diagnosis not present

## 2022-09-02 MED ORDER — LOSARTAN POTASSIUM 100 MG PO TABS: 50.0000 mg | ORAL_TABLET | Freq: Every day | ORAL | 3 refills | Status: DC

## 2022-09-02 NOTE — Progress Notes (Signed)
Subjective:    Patient ID: Michelle Park, female    DOB: 08-14-1950, 72 y.o.   MRN: [id number]  HPI Here due to ongoing concerns about low bood pressure  She has cut down on the losartan--now taking '50mg'$  Had been off BP med while in the hospital for the cellulitis Now back on since coming home No dizziness or syncope No chest pain Some sense of racing heart first thing in the morning---not for long (feels better after her coffee) No edema  Still on the 2 antibiotics for the cellulitis in left hand This is better Going back to [facility]  Current Outpatient Medications on File Prior to Visit  Medication Sig Dispense Refill   acetaminophen (TYLENOL) 325 MG tablet Take 325 mg by mouth.     ALPRAZolam (XANAX) 0.5 MG tablet Take 1 tablet (0.5 mg total) by mouth 3 (three) times daily as needed. 60 tablet 1   aspirin EC 81 MG tablet Take 81 mg by mouth daily. Swallow whole.     calcium-vitamin D (OSCAL WITH D) 500-200 MG-UNIT per tablet Take 1 tablet by mouth 3 (three) times daily with meals. 1500 mg per day for low bone densit     cholecalciferol (VITAMIN D3) 25 MCG (1000 UNIT) tablet Take 1,000 Units by mouth daily.     clopidogrel (PLAVIX) 75 MG tablet Take 75 mg by mouth daily.     doxycycline (VIBRA-TABS) 100 MG tablet Take 1 tablet (100 mg total) by mouth 2 (two) times daily. 14 tablet 0   escitalopram (LEXAPRO) 20 MG tablet Take 1 tablet (20 mg total) by mouth daily. 90 tablet 1   ezetimibe (ZETIA) 10 MG tablet Take 1 tablet by mouth daily.     fenofibrate 160 MG tablet TAKE 1 TABLET BY MOUTH ONCE DAILY AFTER SUPPER 90 tablet 2   losartan (COZAAR) 100 MG tablet Take 1 tablet (100 mg total) by mouth daily. 90 tablet 3   moxifloxacin (AVELOX) 400 MG tablet Take by mouth.     Multiple Vitamin (MULTIVITAMIN WITH MINERALS) TABS tablet Take 1 tablet by mouth daily after lunch. Centrum     nicotine (NICODERM CQ - DOSED IN MG/24 HOURS) 21 mg/24hr patch Place onto the skin.     pantoprazole  (PROTONIX) 40 MG tablet Take 1 tablet (40 mg total) by mouth daily. 30 tablet 3   rosuvastatin (CRESTOR) 5 MG tablet Take 1 tablet by mouth at bedtime.     No current facility-administered medications on file prior to visit.    Allergies  Allergen Reactions   Aspirin Other (See Comments)    Tolerated coated aspirin, prev with GI upset/throat irritation with higher dose- not an allergy.     Atorvastatin     REACTION: causes leg pain   Chantix [Varenicline]     GI upset.      Past Medical History:  Diagnosis Date   Allergic rhinitis due to pollen    Anxiety    Bronchitis    hx of   Chronic back pain    COPD (chronic obstructive pulmonary disease) (HCC)    Depression    Dyspnea    with activity only   Episodic mood disorder (HCC)    Fibromyalgia    GERD (gastroesophageal reflux disease)    Headache    frequent   Heart murmur    Hyperlipidemia    Hypertension    Restless leg syndrome    Spinal stenosis    Stroke ([place])  Past Surgical History:  Procedure Laterality Date   BACK SURGERY     2019 L spine Dr. Saintclair Halsted   ESOPHAGOGASTRODUODENOSCOPY     EYE SURGERY     cataract bil   FOOT SURGERY  1979 and 1988   ingrown toenail     bil great toes   surgery   ROBOTIC ADRENALECTOMY Left 04/01/2019   Procedure: XI ROBOTIC LEFT ADRENALECTOMY;  Surgeon: Ralene Ok, MD;  Location: [facility]S;  Service: General;  Laterality: Left;   TONSILLECTOMY      Family History  Problem Relation Age of Onset   Heart disease Mother    Alzheimer's disease Mother    Cancer Sister    Colon cancer Neg Hx    Esophageal cancer Neg Hx    Inflammatory bowel disease Neg Hx    Liver disease Neg Hx    Pancreatic cancer Neg Hx    Rectal cancer Neg Hx    Stomach cancer Neg Hx    Breast cancer Neg Hx     Social History   Socioeconomic History   Marital status: Divorced    Spouse name: Not on file   Number of children: 1   Years of education: Not on file   Highest education level: Not  on file  Occupational History   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   1 daughter   Social Determinants of Health   Financial Resource Strain: Not on file  Food Insecurity: No Food Insecurity (08/26/2022)   Hunger Vital Sign    Worried About Running Out of Food in the Last Year: Never true    Ran Out of Food in the Last Year: Never true  Transportation Needs: No Transportation Needs (08/26/2022)   PRAPARE - Hydrologist (Medical): No    Lack of Transportation (Non-Medical): No  Physical Activity: Not on file  Stress: Not on file  Social Connections: Not on file  Intimate Partner Violence: Not on file   Review of Systems Needs to reschedule carotid surgery that was postponed Sleeping fair--but needs xanax nightly    Objective:   Physical Exam Constitutional:      Appearance: Normal appearance.  Cardiovascular:     Rate and Rhythm: Normal rate and regular rhythm.     Heart sounds:     No gallop.     Comments: Gr 3/6 aortic systolic murmur Pulmonary:     Effort: Pulmonary effort is normal.     Breath sounds: Normal breath sounds. No wheezing or rales.  Musculoskeletal:     Cervical back: Neck supple.     Right lower leg: No edema.     Left lower leg: No edema.  Lymphadenopathy:     Cervical: No cervical adenopathy.  Skin:    Comments: Puffiness and slightly pink  left hand dorsum No open areas now Not  warm---only slightly tender  Neurological:     Mental Status: She is alert.            Assessment & Plan:

## 2022-09-02 NOTE — Assessment & Plan Note (Signed)
After cat bite Required hospitalization and now home Finishing out doxy 100 bid and moxifloxacin 400 daily Going back to ID soon

## 2022-09-02 NOTE — Assessment & Plan Note (Signed)
BP Readings from Last 3 Encounters:  09/02/22 126/80  08/26/22 130/81  08/19/22 125/64   BP fine on losartan '50mg'$  daily Will continue this for now--and reevaluate over time

## 2022-09-03 ENCOUNTER — Telehealth: Payer: Self-pay | Admitting: *Deleted

## 2022-09-03 ENCOUNTER — Telehealth: Payer: Self-pay | Admitting: Family Medicine

## 2022-09-03 MED ORDER — CLOPIDOGREL BISULFATE 75 MG PO TABS: 75.0000 mg | ORAL_TABLET | Freq: Every day | ORAL | 1 refills | Status: DC

## 2022-09-03 MED ORDER — ROSUVASTATIN CALCIUM 5 MG PO TABS: 5.0000 mg | ORAL_TABLET | Freq: Every day | ORAL | 1 refills | Status: DC

## 2022-09-03 MED ORDER — EZETIMIBE 10 MG PO TABS: 10.0000 mg | ORAL_TABLET | Freq: Every day | ORAL | 1 refills | Status: DC

## 2022-09-03 NOTE — Addendum Note (Signed)
Addended by: Tonia Ghent on: 09/03/2022 04:08 PM   Modules accepted: Orders

## 2022-09-03 NOTE — Telephone Encounter (Signed)
All three rxs are in EMR as historical; okay to refill?

## 2022-09-03 NOTE — Telephone Encounter (Signed)
PLEASE NOTE: All timestamps contained within this report are represented as [place] Standard Time. CONFIDENTIALTY NOTICE: This fax transmission is intended only for the addressee. It contains information that is legally privileged, confidential or otherwise protected from use or disclosure. If you are not the intended recipient, you are strictly prohibited from reviewing, disclosing, copying using or disseminating any of this information or taking any action in reliance on or regarding this information. If you have received this fax in error, please notify us immediately by telephone so that we can arrange for its return to [place]. Phone: [phone], Toll-Free: [phone], Fax: [phone] Page: 1 of 1 Call Id: [id number] [facility] - Client Nonclinical Telephone Record  [facility] Client [facility] - Client Client Site [place] Provider Renford Dills - MD Contact Type Call Who Is Calling Physician / Provider / Hospital Call Type Provider Call Message Only Reason for Call Request to send message to Office Initial Comment Caller states his name is Dr. Maryjo Rochester with [facility], the patient is needing a medication refill. Patient has been reaching out to him. Patient should also be seen if she has not for a follow-up. Dr. Maryjo Rochester requested this be sent as a message to the office. Darrelyn Morro 1951/02/13 Phone # to follow up with patient [phone] Request was sent from [facility] in [place], medication pitagrel for plavis, azatemine and [employer]. Disp. Time Disposition Final User 09/03/2022 7:55:31 AM General Information Provided Yes Orrin Brigham Call Closed By: Orrin Brigham Transaction Date/Time: 09/03/2022 7:49:38 AM (ET)

## 2022-09-03 NOTE — Telephone Encounter (Signed)
Prescription Request  09/03/2022  Is this a "Controlled Substance" medicine? No  LOV: 08/19/2022  What is the name of the medication or equipment? clopidogrel (PLAVIX) 75 MG tablet /  rosuvastatin (CRESTOR) 5 MG tablet ezetimibe (ZETIA) 10 MG tablet  Have you contacted your pharmacy to request a refill? No   Which pharmacy would you like this sent to?  [address] [facility], [place] [address] Phone: [phone] Fax: [phone]    Patient notified that their request is being sent to the clinical staff for review and that they should receive a response within 2 business days.   Please advise at Mobile [phone] (mobile)

## 2022-09-03 NOTE — Telephone Encounter (Signed)
Patient needs crestor and plavix refilled but is entered in EMR as historical. Are you okay to refill? If so, please send to [facility] on file.

## 2022-09-03 NOTE — Telephone Encounter (Signed)
See other TE requesting refills.

## 2022-09-03 NOTE — Telephone Encounter (Signed)
I sent all 3. Thanks.

## 2022-09-04 HISTORY — PX: OTHER SURGICAL HISTORY: SHX169

## 2022-09-09 ENCOUNTER — Other Ambulatory Visit: Payer: Self-pay | Admitting: Family Medicine

## 2022-09-09 DIAGNOSIS — Z1231 Encounter for screening mammogram for malignant neoplasm of breast: Secondary | ICD-10-CM

## 2022-09-17 DIAGNOSIS — N183 Chronic kidney disease, stage 3 unspecified: Secondary | ICD-10-CM | POA: Insufficient documentation

## 2022-09-25 ENCOUNTER — Telehealth: Payer: Self-pay | Admitting: Family Medicine

## 2022-09-25 NOTE — Telephone Encounter (Signed)
Blanch Media from [employer] called asking for status of ppw they faxed in on 2/20? Call back # [phone]

## 2022-09-26 NOTE — Telephone Encounter (Signed)
I have not received any ppw on this patient. Called and left message on joyce vm.

## 2022-09-28 ENCOUNTER — Telehealth: Payer: Self-pay | Admitting: Family Medicine

## 2022-09-28 DIAGNOSIS — D509 Iron deficiency anemia, unspecified: Secondary | ICD-10-CM

## 2022-09-28 NOTE — Telephone Encounter (Signed)
Patient needs follow-up with GI regarding iron deficiency anemia.  I went ahead and put in the referral so she can schedule the appointment when feasible.  I realize she just had her carotid surgery but I think it makes sense to go ahead and get the referral started.  Thanks.

## 2022-09-29 ENCOUNTER — Telehealth: Payer: Self-pay | Admitting: Family Medicine

## 2022-09-29 ENCOUNTER — Telehealth: Payer: Self-pay | Admitting: *Deleted

## 2022-09-29 NOTE — Telephone Encounter (Signed)
Blanch Media from [employer] called in to know the status of grievance , medical records form that was fax the office . # [phone]

## 2022-09-29 NOTE — Transitions of Care (Post Inpatient/ED Visit) (Signed)
   09/29/2022  Name: Michelle Park MRN: [id number] DOB: Sep 17, 1950  Today's TOC FU Call Status: Today's TOC FU Call Status:: Unsuccessul Call (1st Attempt) Unsuccessful Call (1st Attempt) Date: 09/29/22  Attempted to reach the patient regarding the most recent Inpatient/ED visit.  Follow Up Plan: Additional outreach attempts will be made to reach the patient to complete the Transitions of Care (Post Inpatient/ED visit) call.   [facility] [phone]

## 2022-09-30 ENCOUNTER — Telehealth: Payer: Self-pay | Admitting: *Deleted

## 2022-09-30 NOTE — Telephone Encounter (Signed)
Spoke with management and requests have been sent to medical records not me. Medical records will handle this request.

## 2022-09-30 NOTE — Telephone Encounter (Signed)
I have tried to call Michelle Park again for the second time to let her know I have not received anything from her. Waiting on a call to discuss with her.

## 2022-09-30 NOTE — Telephone Encounter (Signed)
Called patient she would like to review more in person at follow up on Friday.

## 2022-09-30 NOTE — Telephone Encounter (Signed)
See other TE. Tried to call Blanch Media again this morning.

## 2022-09-30 NOTE — Transitions of Care (Post Inpatient/ED Visit) (Signed)
   09/30/2022  Name: Michelle Park MRN: [id number] DOB: 05-26-1951  Today's TOC FU Call Status: Today's TOC FU Call Status:: Successful TOC FU Call Competed  Transition Care Management Follow-up Telephone Call Date of Discharge: 09/25/22 Discharge Facility: Other ([facility]) Name of Other (Non[facility]) Discharge Facility: [facility] Type of Discharge: Inpatient Admission Primary Inpatient Discharge Diagnosis:: Carotid stenosis How have you been since you were released from the hospital?: Better (But I feel weak) Any questions or concerns?: No  Items Reviewed: Did you receive and understand the discharge instructions provided?: Yes Medications obtained and verified?: Yes (Medications Reviewed) Any new allergies since your discharge?: No Dietary orders reviewed?: NA Do you have support at home?: Yes People in Home: child(ren), adult Name of Support/Comfort Primary Source: Michelle Park daughter  Home Care and Equipment/Supplies: [facility] Ordered?: Yes Name of Stallings:: [place] set up a time to come to your home?: No (patient  cancelled [id number] But will call that back and set up a time) EMR reviewed for Home Health Orders: Orders present/patient has not received call (refer to CM for follow-up) Any new equipment or medical supplies ordered?: No  Functional Questionnaire: Do you need assistance with bathing/showering or dressing?: No Do you need assistance with meal preparation?: Yes Do you need assistance with eating?: No Do you have difficulty maintaining continence: No Do you need assistance with getting out of bed/getting out of a chair/moving?: No Do you have difficulty managing or taking your medications?: No  Folllow up appointments reviewed: PCP Follow-up appointment confirmed?: Yes Date of PCP follow-up appointment?: 10/03/22 Follow-up Provider: Dr Damita Dunnings [facility] Follow-up appointment confirmed?: Yes Date of  Specialist follow-up appointment?: 10/21/22 Follow-Up Specialty Provider:: Dr Levan Hurst surgeon Do you need transportation to your follow-up appointment?: No (Daughter will take patient. Patient  made aware of the [facility] transportation) Do you understand care options if your condition(s) worsen?: Yes-patient verbalized understanding  SDOH Interventions Today    Flowsheet Row Most Recent Value  SDOH Interventions   Food Insecurity Interventions Intervention Not Indicated  Housing Interventions Intervention Not Indicated  Transportation Interventions Intervention Not Indicated, Other (Comment)  [Patient stated that her daughter has to take off to take her to her appointments. RN made patient aware of the [facility] transportation system]      Interventions Today    Flowsheet Row Most Recent Value  General Interventions   General Interventions Discussed/Reviewed [employer], General Interventions Discussed  [RN discussed [employer] transportation]  Exercise Interventions   Exercise Discussed/Reviewed Exercise Discussed  [RN discussed the need for PT becuase patient was declining initially, but was stating she felt weak. RN discussed building strength.]       [facility] [phone]

## 2022-09-30 NOTE — Telephone Encounter (Signed)
Okay.  Thanks.

## 2022-10-03 ENCOUNTER — Inpatient Hospital Stay: Payer: Medicare Other | Admitting: Family Medicine

## 2022-10-07 ENCOUNTER — Other Ambulatory Visit: Payer: Self-pay | Admitting: Family Medicine

## 2022-10-08 NOTE — Telephone Encounter (Signed)
Refill request for ALPRAZolam 0.5 MG Oral Tablet   LOV - 09/02/22 Next OV - 10/10/22 Last refill - 08/12/22 #60/1

## 2022-10-08 NOTE — Telephone Encounter (Signed)
Sent. Thanks.   

## 2022-10-10 ENCOUNTER — Telehealth: Payer: Self-pay | Admitting: Family Medicine

## 2022-10-10 ENCOUNTER — Ambulatory Visit (INDEPENDENT_AMBULATORY_CARE_PROVIDER_SITE_OTHER): Payer: Medicare Other | Admitting: Family Medicine

## 2022-10-10 ENCOUNTER — Encounter: Payer: Self-pay | Admitting: Family Medicine

## 2022-10-10 VITALS — BP 108/58 | HR 88 | Temp 97.3°F | Wt 208.0 lb

## 2022-10-10 DIAGNOSIS — W5501XD Bitten by cat, subsequent encounter: Secondary | ICD-10-CM | POA: Diagnosis not present

## 2022-10-10 DIAGNOSIS — D649 Anemia, unspecified: Secondary | ICD-10-CM

## 2022-10-10 MED ORDER — LOSARTAN POTASSIUM 100 MG PO TABS: 100.0000 mg | ORAL_TABLET | Freq: Every day | ORAL | Status: DC

## 2022-10-10 NOTE — Patient Instructions (Signed)
Please schedule with GI when possible.  Go to the lab on the way out.   If you have mychart we'll likely use that to update you.    Take care.  Glad to see you.

## 2022-10-10 NOTE — Progress Notes (Unsigned)
Prev phone note reviewed, re: follow-up with GI regarding iron deficiency anemia.  Referral placed prev.  I encouraged GI follow up.  She is considering which clinic she wants to see.    Discussed rationale for GI follow up.  She is due for colonoscopy, even without h/o anemia.  Started having black stools after taking iron but not prior.  No red stools.  Recheck labs pending for today.  H/o cat bite.  Her hand symptoms have resolved in the meantime.  H/o CEA per outside clinic.   She reported feeling better off zetia.  Discussed.  She had fatigue while taking med (unclear if related to med or other issue, discussed).    Meds, vitals, and allergies reviewed.   ROS: Per HPI unless specifically indicated in ROS section   Nad Ncat Neck supple, no LA L side of neck bandaged.  No spreading erythema. Rrr, SEM noted.  Ctab Abd soft, not ttp.  Extremities well-perfused.   Hands symmetric inspection, normal grip.  No fluctuant mass or spreading erythema.  No sign of infection on the hands.  30 minutes were devoted to patient care in this encounter (this includes time spent reviewing the patient's file/history, interviewing and examining the patient, counseling/reviewing plan with patient).

## 2022-10-10 NOTE — Telephone Encounter (Signed)
Verbal orders have been given to [facility]

## 2022-10-10 NOTE — Telephone Encounter (Signed)
Home Health verbal orders Caller Name: [place] Name: [facility]  Callback number: [phone]  Requesting Skilled nursing  Frequency: 1x a week for 1 week  Please forward to [facility] pool or providers CMA

## 2022-10-10 NOTE — Telephone Encounter (Signed)
Please give the order.  Thanks.   

## 2022-10-11 LAB — CBC WITH DIFFERENTIAL/PLATELET
Absolute Monocytes: 739 cells/uL (ref 200–950)
Basophils Absolute: 92 cells/uL (ref 0–200)
Basophils Relative: 1.2 %
Eosinophils Absolute: 231 cells/uL (ref 15–500)
Eosinophils Relative: 3 %
HCT: 34.9 % — ABNORMAL LOW (ref 35.0–45.0)
Hemoglobin: 11.7 g/dL (ref 11.7–15.5)
Lymphs Abs: 1925 cells/uL (ref 850–3900)
MCH: 29.2 pg (ref 27.0–33.0)
MCHC: 33.5 g/dL (ref 32.0–36.0)
MCV: 87 fL (ref 80.0–100.0)
MPV: 9.1 fL (ref 7.5–12.5)
Monocytes Relative: 9.6 %
Neutro Abs: 4712 cells/uL (ref 1500–7800)
Neutrophils Relative %: 61.2 %
Platelets: 494 10*3/uL — ABNORMAL HIGH (ref 140–400)
RBC: 4.01 10*6/uL (ref 3.80–5.10)
RDW: 14.2 % (ref 11.0–15.0)
Total Lymphocyte: 25 %
WBC: 7.7 10*3/uL (ref 3.8–10.8)

## 2022-10-11 LAB — VITAMIN B12: Vitamin B-12: 501 pg/mL (ref 200–1100)

## 2022-10-11 LAB — BASIC METABOLIC PANEL
BUN: 23 mg/dL (ref 7–25)
CO2: 23 mmol/L (ref 20–32)
Calcium: 10.3 mg/dL (ref 8.6–10.4)
Chloride: 103 mmol/L (ref 98–110)
Creat: 0.97 mg/dL (ref 0.60–1.00)
Glucose, Bld: 103 mg/dL — ABNORMAL HIGH (ref 65–99)
Potassium: 4.5 mmol/L (ref 3.5–5.3)
Sodium: 136 mmol/L (ref 135–146)

## 2022-10-11 LAB — IRON: Iron: 72 ug/dL (ref 45–160)

## 2022-10-11 LAB — FERRITIN: Ferritin: 67 ng/mL (ref 16–288)

## 2022-10-12 DIAGNOSIS — D649 Anemia, unspecified: Secondary | ICD-10-CM | POA: Insufficient documentation

## 2022-10-12 NOTE — Assessment & Plan Note (Signed)
Clinically resolved.  

## 2022-10-12 NOTE — Assessment & Plan Note (Signed)
Discussed rationale for GI follow-up.  I think it makes sense to go ahead with a referral, to try to get her scheduled in the meantime instead of waiting until later this spring and then trying to get the appointment scheduled.  She will consider.  We can recheck labs today.  Continue iron in the meantime.  This point okay for outpatient follow-up.  Recent history discussed in detail.

## 2022-10-14 ENCOUNTER — Ambulatory Visit: Payer: Medicare Other

## 2022-10-21 ENCOUNTER — Telehealth: Payer: Self-pay | Admitting: Family Medicine

## 2022-10-21 NOTE — Telephone Encounter (Signed)
Ria Comment from [facility] called to make PCP aware that pt is refusing service . # [phone]

## 2022-10-21 NOTE — Telephone Encounter (Signed)
I'll defer to patient.  Thanks.  

## 2022-10-27 ENCOUNTER — Telehealth: Payer: Self-pay | Admitting: Family Medicine

## 2022-10-27 MED ORDER — ALPRAZOLAM 0.5 MG PO TABS: 0.5000 mg | ORAL_TABLET | Freq: Three times a day (TID) | ORAL | 1 refills | Status: DC | PRN

## 2022-10-27 NOTE — Telephone Encounter (Signed)
I sent rx for #90 with a refill.  It is not typical to send 90 day supply for this med.

## 2022-10-27 NOTE — Telephone Encounter (Signed)
LOV - 10/10/22 NOV - Not scheduled RF - 10/08/22 #60/0

## 2022-10-27 NOTE — Addendum Note (Signed)
Addended by: Tonia Ghent on: 10/27/2022 02:02 PM   Modules accepted: Orders

## 2022-10-27 NOTE — Telephone Encounter (Signed)
Prescription Request  10/27/2022  LOV: 10/10/2022  What is the name of the medication or equipment?  ALPRAZolam (XANAX) 0.5 MG tablet.Marland KitchenMarland KitchenPatient would like a 90 day supply sent in since she is currently taking 3 a day.   Have you contacted your pharmacy to request a refill? No   Which pharmacy would you like this sent to?  [place], [place] [address] Phone: [phone] Fax: [phone]    Patient notified that their request is being sent to the clinical staff for review and that they should receive a response within 2 business days.   Please advise at [place]

## 2022-11-09 ENCOUNTER — Other Ambulatory Visit: Payer: Self-pay | Admitting: Family Medicine

## 2022-11-09 DIAGNOSIS — I10 Essential (primary) hypertension: Secondary | ICD-10-CM

## 2022-11-12 ENCOUNTER — Encounter: Payer: Medicare Other | Admitting: Pharmacist

## 2022-11-19 ENCOUNTER — Telehealth: Payer: Self-pay | Admitting: Family Medicine

## 2022-11-19 MED ORDER — ESCITALOPRAM OXALATE 10 MG PO TABS: 10.0000 mg | ORAL_TABLET | Freq: Every day | ORAL | 0 refills | Status: DC

## 2022-11-19 NOTE — Telephone Encounter (Signed)
Verify alprazolam use and let me know.    If she is still using alprazolam once a week (or more) on average, then I wouldn't taper lexapro.    I sent the rx in the meantime but if she is still using alprazolam with any regularity we need to address a replacement for lexapro at an OV.    Thanks.

## 2022-11-19 NOTE — Telephone Encounter (Signed)
Pt has 2 lexapro left. Wants to reduce to 30 day  to ween herself off of medication.  Please call this in instead of the 90 day.

## 2022-11-20 ENCOUNTER — Ambulatory Visit
Admission: RE | Admit: 2022-11-20 | Discharge: 2022-11-20 | Disposition: A | Discharge status: Home / Self Care | Payer: Medicare Other | Source: Ambulatory Visit | Attending: Family Medicine | Admitting: Family Medicine

## 2022-11-20 DIAGNOSIS — Z1231 Encounter for screening mammogram for malignant neoplasm of breast: Secondary | ICD-10-CM

## 2022-11-20 NOTE — Telephone Encounter (Signed)
LMTCB

## 2022-11-21 NOTE — Telephone Encounter (Signed)
Patient is aware of medication dosing.

## 2022-11-21 NOTE — Telephone Encounter (Signed)
Called and spoke with patient she states she is taking 2-3 alprazolam daily. She picked up the prescription of Lexapro and will take in the meantime. She has OV scheduled 11/25/22 to address ongoing back pain and would like to discuss lexapro replacement at that time as well.

## 2022-11-21 NOTE — Telephone Encounter (Signed)
I would not taper lexapro in the meantime. Would continue  a day.  She can take 2 of the  tabs at a time.  Thanks.

## 2022-11-25 ENCOUNTER — Ambulatory Visit (INDEPENDENT_AMBULATORY_CARE_PROVIDER_SITE_OTHER): Payer: Medicare Other | Admitting: Family Medicine

## 2022-11-25 ENCOUNTER — Encounter: Payer: Self-pay | Admitting: Family Medicine

## 2022-11-25 VITALS — BP 136/80 | HR 109 | Temp 97.7°F | Ht 65.0 in | Wt 208.0 lb

## 2022-11-25 DIAGNOSIS — I6529 Occlusion and stenosis of unspecified carotid artery: Secondary | ICD-10-CM | POA: Diagnosis not present

## 2022-11-25 DIAGNOSIS — M549 Dorsalgia, unspecified: Secondary | ICD-10-CM | POA: Diagnosis not present

## 2022-11-25 DIAGNOSIS — D649 Anemia, unspecified: Secondary | ICD-10-CM | POA: Diagnosis not present

## 2022-11-25 DIAGNOSIS — G8929 Other chronic pain: Secondary | ICD-10-CM

## 2022-11-25 DIAGNOSIS — F419 Anxiety disorder, unspecified: Secondary | ICD-10-CM | POA: Diagnosis not present

## 2022-11-25 MED ORDER — ESCITALOPRAM OXALATE 20 MG PO TABS: 20.0000 mg | ORAL_TABLET | Freq: Every day | ORAL | 1 refills | Status: DC

## 2022-11-25 MED ORDER — HYDROCODONE-ACETAMINOPHEN 5-325 MG PO TABS: 1.0000 | ORAL_TABLET | Freq: Three times a day (TID) | ORAL | 0 refills | Status: DC | PRN

## 2022-11-25 MED ORDER — FENOFIBRATE 160 MG PO TABS: ORAL_TABLET | ORAL | 1 refills | Status: DC

## 2022-11-25 NOTE — Progress Notes (Unsigned)
Anxiety d/w pt.  If continued/enough sx to take xanax, then would need/recommend ongoing preventive tx, ie lexapro or other. We agreed to continue lexapro , rx sent.  She agreed with plan.  D/w pt about seeing GI- encouraged her to follow up on that.  Prev with GI referral placed.  She had called prev per patient report and is going to call again, d/w pt.    Discussed restarting fenofibrate, she stopped in the meantime.  Encouraged restart.  No ADE on med previously.  Hip/buttock and leg pain continue.  Prev MRI d/w pt.     IMPRESSION: 1. Multilevel degenerative changes of the lumbar spine as described above, worst at L4-5 where there is severe spinal canal stenosis, moderate right and severe left neural foraminal narrowing. 2. Mild spinal canal stenosis at L3-4 with moderate left neural foraminal narrowing, progressed since prior MRI. 3. Moderate right neural foraminal narrowing at L1-2 and L2-3.  She has less pain with sitting and leaning forward.  B hip pain.  Leg pain B with walking.  We talked about pain med options and opiate cautions.  Sedation caution.  Discussed getting neurosurgery eval done.  She would need pain contract etc if continued rx.    Meds, vitals, and allergies reviewed.   ROS: Per HPI unless specifically indicated in ROS section   Nad Ncat Neck supple, no LA Rrr SEM noted.  ctab Small blanching area of pink skin on the R upper chest wall.  No fluctuant mass.  I asked her to monitor that.   Prev scar noted on the lower back.  Not tender to palpation locally. Able to bear weight.  Distally neurovascular intact on gross exam.  30 minutes were devoted to patient care in this encounter (this includes time spent reviewing the patient's file/history, interviewing and examining the patient, counseling/reviewing plan with patient).

## 2022-11-25 NOTE — Patient Instructions (Addendum)
Please call the GI clinic about follow up.    Sedation caution with xanax and hydrocodone.  Total tylenol amount per dose needs to be less than .  Total daily dose of tylenol needs to be less than .   Call about seeing Dr. Wynetta Emery.   Take care.  Glad to see you.

## 2022-11-26 NOTE — Assessment & Plan Note (Signed)
Discussed restarting fenofibrate.

## 2022-11-26 NOTE — Assessment & Plan Note (Signed)
Continue Lexapro 20 mg a day.  Continue as needed Xanax.  Routine cautions given.  Sedation caution on Xanax.

## 2022-11-26 NOTE — Assessment & Plan Note (Signed)
Discussed neurosurgery evaluation.  Encouraged her to follow-up with neurosurgery.  Routine OB cautions discussed with patient.  This is a reasonable option in the short-term given her situation.  Opiate cautions discussed, especially with concurrent alprazolam use.  Tylenol cautions discussed.  See after visit summary.  She is aware of the risk versus benefit of opiate use.  Not sedated.  Will await neurosurgery evaluation.  She is aware she would need pain contract, etc. if she has continued opiate use to this clinic.

## 2022-11-26 NOTE — Assessment & Plan Note (Signed)
Encouraged GI follow-up.

## 2022-12-11 ENCOUNTER — Telehealth: Payer: Self-pay | Admitting: Family Medicine

## 2022-12-11 ENCOUNTER — Other Ambulatory Visit: Payer: Self-pay | Admitting: Family Medicine

## 2022-12-11 NOTE — Telephone Encounter (Signed)
Patient called in with questions regarding her last visit. She can be reached at [phone]. Thank you!

## 2022-12-12 MED ORDER — HYDROCODONE-ACETAMINOPHEN 5-325 MG PO TABS: 1.0000 | ORAL_TABLET | Freq: Three times a day (TID) | ORAL | 0 refills | Status: DC | PRN

## 2022-12-12 NOTE — Telephone Encounter (Signed)
Pt called in stated she has swollen Knuckles . Stated unable to come in for an appointment . Wants advise from PCP  # [phone]

## 2022-12-12 NOTE — Addendum Note (Signed)
Addended by: Joaquim Nam on: 12/12/2022 05:04 PM   Modules accepted: Orders

## 2022-12-12 NOTE — Telephone Encounter (Signed)
Called patient reviewed all information and repeated back to me. Will call if any questions.  Patient aware states she will get further scripts from neurosurgery after she is seen.  I have made follow up but she did not want to come in for 2 weeks for hand. Will call if any changes or wants to be seen sooner

## 2022-12-12 NOTE — Telephone Encounter (Signed)
Called patient. Wanted to know if she can get some more pain medication called in for hydrocodone. She does not see neurosurgeon until may 14th. She only received 3 days worth at last visit. If so would like to have sent into [facility]   Patient states that occasionally she will have selling in knuckles on left hand only. States it has started last night. Can be very tender. Area gets red and inflamed. She sates that has had some improvement with symptoms today after putting ice and taking tylenol. Wanted to know if she needs to do something further for treatment. Declines office appointment at this time. She states it can happen every 2-3 months and last for 3-4 days. Limits her ability to use left hand. She is left hand dominate.

## 2022-12-12 NOTE — Telephone Encounter (Signed)
I sent the hydrocodone rx.  She'll need pain contract, etc if continue rx through the clinic here.  Rec: in person eval re: hand.  I would need to check her hand to see about options, possible imaging/labs.  Thanks.

## 2022-12-14 NOTE — Telephone Encounter (Signed)
Noted. Thanks.

## 2022-12-15 ENCOUNTER — Ambulatory Visit (INDEPENDENT_AMBULATORY_CARE_PROVIDER_SITE_OTHER): Payer: Medicare Other | Admitting: Internal Medicine

## 2022-12-15 ENCOUNTER — Encounter: Payer: Self-pay | Admitting: Internal Medicine

## 2022-12-15 VITALS — BP 132/84 | HR 81 | Temp 97.9°F | Ht 65.0 in | Wt 210.0 lb

## 2022-12-15 DIAGNOSIS — M79601 Pain in right arm: Secondary | ICD-10-CM | POA: Insufficient documentation

## 2022-12-15 NOTE — Assessment & Plan Note (Signed)
Feels the pain throughout--but woke mostly with pain in hand Findings are in wrist--but not clear synovitis Doesn't seem to be radicular but has appt with neurosurgeon tomorrow anyway  Discussed heat, tylenol Can try topical diclofenac for several days

## 2022-12-15 NOTE — Patient Instructions (Signed)
You can try heat and several days of over the counter topical diclofenac three times  a day

## 2022-12-15 NOTE — Progress Notes (Signed)
Subjective:    Patient ID: Michelle Park, female    DOB: September 06, 1950, 72 y.o.   MRN: [id number]  HPI Here due to right arm pain  3 days ago--called in and spoke to nurse Felt her knuckles of left hand were swollen and she couldn't use her hand Tried ice/elevation/tylenol improved  Then went out yesterday for Mother's Day--was stressful Then later in the day noted pain in right arm--from wrist to shoulder No injury Tried tylenol Then awoke with excruciating burning pain ---3AM. Like "hot coals" going to fingers (not thumb) She has had this before---with fibromyalgia  Past diagnosis of fibromyalgia--wondered if it could be this  Current Outpatient Medications on File Prior to Visit  Medication Sig Dispense Refill   ALPRAZolam (XANAX) 0.5 MG tablet Take 1 tablet (0.5 mg total) by mouth 3 (three) times daily as needed. 90 tablet 1   aspirin EC 81 MG tablet Take 81 mg by mouth daily. Swallow whole.     calcium-vitamin D (OSCAL WITH D) 500-200 MG-UNIT per tablet Take 1 tablet by mouth 3 (three) times daily with meals. 1500 mg per day for low bone densit     cholecalciferol (VITAMIN D3) 25 MCG (1000 UNIT) tablet Take 1,000 Units by mouth daily.     clopidogrel (PLAVIX) 75 MG tablet Take 1 tablet (75 mg total) by mouth daily. 90 tablet 1   escitalopram (LEXAPRO) 20 MG tablet Take 1 tablet (20 mg total) by mouth daily. 90 tablet 1   fenofibrate 160 MG tablet TAKE 1 TABLET BY MOUTH ONCE DAILY AFTER SUPPER 90 tablet 1   HYDROcodone-acetaminophen (NORCO/VICODIN) 5-325 MG tablet Take 1 tablet by mouth 3 (three) times daily as needed for moderate pain (sedation caution). 15 tablet 0   losartan (COZAAR) 100 MG tablet Take 1 tablet (100 mg total) by mouth daily.     Multiple Vitamin (MULTIVITAMIN WITH MINERALS) TABS tablet Take 1 tablet by mouth daily after lunch. Centrum     pantoprazole (PROTONIX) 40 MG tablet Take 1 tablet by mouth once daily 30 tablet 2   rosuvastatin (CRESTOR) 5 MG tablet  Take 1 tablet (5 mg total) by mouth at bedtime. 90 tablet 1   No current facility-administered medications on file prior to visit.    Allergies  Allergen Reactions   Atorvastatin     REACTION: causes leg pain   Chantix [Varenicline]     GI upset.     Ibandronic Acid     Other Reaction(s): joint pain     Past Medical History:  Diagnosis Date   Allergic rhinitis due to pollen    Anxiety    Bronchitis    hx of   Chronic back pain    COPD (chronic obstructive pulmonary disease) (HCC)    Depression    Dyspnea    with activity only   Episodic mood disorder (HCC)    Fibromyalgia    GERD (gastroesophageal reflux disease)    Headache    frequent   Heart murmur    Hyperlipidemia    Hypertension    Restless leg syndrome    Spinal stenosis    Stroke [facility])     Past Surgical History:  Procedure Laterality Date   BACK SURGERY     2019 L spine Dr. Wynetta Emery   carotid artery surgery  09/2022   ESOPHAGOGASTRODUODENOSCOPY     EYE SURGERY     cataract bil   FOOT SURGERY  1979 and 1988   ingrown toenail  bil great toes   surgery   ROBOTIC ADRENALECTOMY Left 04/01/2019   Procedure: XI ROBOTIC LEFT ADRENALECTOMY;  Surgeon: Axel Filler, MD;  Location: [facility]S;  Service: General;  Laterality: Left;   TONSILLECTOMY      Family History  Problem Relation Age of Onset   Heart disease Mother    Alzheimer's disease Mother    Cancer Sister    Colon cancer Neg Hx    Esophageal cancer Neg Hx    Inflammatory bowel disease Neg Hx    Liver disease Neg Hx    Pancreatic cancer Neg Hx    Rectal cancer Neg Hx    Stomach cancer Neg Hx    Breast cancer Neg Hx     Social History   Socioeconomic History   Marital status: Divorced    Spouse name: Not on file   Number of children: 1   Years of education: Not on file   Highest education level: Not on file  Occupational History   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   1 daughter   Social Determinants of Health   Financial Resource Strain: Not on file  Food Insecurity: No Food Insecurity (09/30/2022)   Hunger Vital Sign    Worried About Running Out of Food in the Last Year: Never true    Ran Out of Food in the Last Year: Never true  Transportation Needs: No Transportation Needs (09/30/2022)   PRAPARE - Administrator, Civil Service (Medical): No    Lack of Transportation (Non-Medical): No  Physical Activity: Not on file  Stress: Not on file  Social Connections: Not on file  Intimate Partner Violence: Not on file   Review of Systems Limited walking due to spinal stenosis    Objective:   Physical Exam Constitutional:      Appearance: Normal appearance.  Musculoskeletal:     Comments: No swelling or tenderness in left fingers/hand Sig tenderness in right wrist but not fingers. Pain with ROM--but not clear synovitis Elbow and shoulder are quiet  Neurological:     Mental Status: She is alert.            Assessment & Plan:

## 2022-12-22 ENCOUNTER — Ambulatory Visit (INDEPENDENT_AMBULATORY_CARE_PROVIDER_SITE_OTHER): Payer: Medicare Other | Admitting: Family Medicine

## 2022-12-22 VITALS — BP 118/78 | HR 77 | Temp 97.6°F | Ht 65.0 in | Wt 205.0 lb

## 2022-12-22 DIAGNOSIS — M79643 Pain in unspecified hand: Secondary | ICD-10-CM

## 2022-12-22 DIAGNOSIS — M79602 Pain in left arm: Secondary | ICD-10-CM

## 2022-12-22 DIAGNOSIS — R1310 Dysphagia, unspecified: Secondary | ICD-10-CM | POA: Diagnosis not present

## 2022-12-22 DIAGNOSIS — M79601 Pain in right arm: Secondary | ICD-10-CM

## 2022-12-22 NOTE — Progress Notes (Unsigned)
She couldn't tolerate hydrocodone.   She is having diffuse arm pain and leg pain.  Back and legs hurt all the time.  Burning pain in the hands intermittently.  She can have hand pain w/o diffuse arm pain.  She can have episodic hand joint swelling.  Hand joints can get red, usually L>R.  All of this was going on for years, predates current statin use.    She was prev dx'd with fibromyalgia.    TM wnl OP wnl Neck w/o LA.   Dysphagia with water recently.  "Like I'm gagging."  She has GI f/u pending.  Dr. Harold Barban.  Need to update GI.   No choking on solids.    Meds, vitals, and allergies reviewed.   ROS: Per HPI unless specifically indicated in ROS section   Normal hand exam now.    Tangential in conversation.

## 2022-12-22 NOTE — Patient Instructions (Addendum)
Go to the lab on the way out.   If you have mychart we'll likely use that to update you.    Xray and labs tomorrow.   Take care.

## 2022-12-23 ENCOUNTER — Other Ambulatory Visit: Payer: Medicare Other

## 2022-12-23 ENCOUNTER — Other Ambulatory Visit: Payer: Self-pay | Admitting: Family Medicine

## 2022-12-23 NOTE — Telephone Encounter (Signed)
Refill request Alprazolam Last office visit 12/22/22 Last refill 10/27/22 #90/1

## 2022-12-24 DIAGNOSIS — R1310 Dysphagia, unspecified: Secondary | ICD-10-CM | POA: Insufficient documentation

## 2022-12-24 NOTE — Telephone Encounter (Signed)
Pt called in asking why medication had not been refilled yet when she called it in yesterday.  I explained that we need 48 hrs to turn around these requests.  Pt is upset that she called it in to her pharmacy yesterday and Dr. Para March did not sign it yesterday.  I again explained that the refill request had been sent to Dr. Para March and that since the medication is a controlled substance that Dr. Para March would have to send the refill.  Pt asked "will I have to wait until tomorrow I am out of medication?" I reported that I would let Dr. Para March know that she was out of medication.

## 2022-12-24 NOTE — Assessment & Plan Note (Signed)
Of unclear source.  Reasonable to check plain films of her neck.  See above.  At this point still okay for outpatient follow-up.

## 2022-12-24 NOTE — Assessment & Plan Note (Signed)
To liquids but not solids and I will ask for GI input.  I appreciate the help of all involved.

## 2022-12-24 NOTE — Assessment & Plan Note (Signed)
Of unclear source.  She can return for basic rheumatologic labs.

## 2022-12-24 NOTE — Telephone Encounter (Signed)
We need 48 hours for rx request.  She didn't request this refill at the OV.  Had she discussed it then, we could have potentially addressed it at the OV on 12/22/22.

## 2022-12-25 ENCOUNTER — Other Ambulatory Visit: Payer: Self-pay

## 2022-12-25 ENCOUNTER — Ambulatory Visit: Payer: Medicare Other | Admitting: Family Medicine

## 2022-12-25 ENCOUNTER — Telehealth: Payer: Self-pay

## 2022-12-25 ENCOUNTER — Telehealth: Payer: Self-pay | Admitting: Family Medicine

## 2022-12-25 DIAGNOSIS — D3500 Benign neoplasm of unspecified adrenal gland: Secondary | ICD-10-CM

## 2022-12-25 NOTE — Telephone Encounter (Signed)
-----   Message from Lemar Lofty., MD sent at 12/25/2022  5:34 AM EDT ----- GSD, Thanks for sending me this message. I have not seen her in years. In the last clinic visit that I had we actually got her scheduled for a repeat upper endoscopy and colonoscopy and then it looks like she canceled on 2 occasions. She has a follow-up in clinic in August but I am okay with the patient coming in directly for a EGD/colonoscopy if she is willing. I will have my team offer her this. Otherwise if we have an earlier follow-up with one of our APP's we can try to get her scheduled for that in the interim.  Treyshon Buchanon, Please reach out to this patient, offer her EGD/colonoscopy scheduling for workup of her symptoms as well as for our previously discussed needs in the setting of her Barrett's esophagus, gastric intestinal metaplasia, and colon cancer screening needs.  If she wants to wait to discuss this in clinic then she can wait for the currently scheduled visit with me or if there is an APP visit that is earlier, that is okay as well. If she is okay with moving directly towards the EGD/colonoscopy then go ahead and get her scheduled. Thanks. GM ----- Message ----- From: Joaquim Nam, MD Sent: 12/24/2022  11:27 PM EDT To: Lemar Lofty., MD

## 2022-12-25 NOTE — Telephone Encounter (Signed)
Patient needs a referral sent out to her endocrinologist Dr Elvera Lennox. She had a mass removed in 2020 and she need a referral to follow up with her because she hasn't seen her since 2021.

## 2022-12-25 NOTE — Telephone Encounter (Signed)
Left message on machine to call back  

## 2022-12-26 NOTE — Addendum Note (Signed)
Addended by: Joaquim Nam on: 12/26/2022 06:56 AM   Modules accepted: Orders

## 2022-12-26 NOTE — Telephone Encounter (Signed)
I put in the referral.  Thanks.  

## 2022-12-26 NOTE — Telephone Encounter (Signed)
I spoke with the pt and offered to make a sooner appt with app but pt declines and would like to see Dr Meridee Score only.  I offered a sooner app with Dr Meridee Score but she is unable to take the sooner appt because she can not come in the mornings.  She will keep appt as planned to discuss procedure.  (She prefers to see GM first)

## 2023-01-06 ENCOUNTER — Emergency Department (HOSPITAL_COMMUNITY): Payer: Medicare Other

## 2023-01-06 ENCOUNTER — Emergency Department (HOSPITAL_COMMUNITY)
Admission: EM | Admit: 2023-01-06 | Discharge: 2023-01-06 | Disposition: A | Discharge status: Home / Self Care | Payer: Medicare Other | Attending: Emergency Medicine | Admitting: Emergency Medicine

## 2023-01-06 ENCOUNTER — Telehealth: Payer: Self-pay | Admitting: Family Medicine

## 2023-01-06 ENCOUNTER — Other Ambulatory Visit: Payer: Self-pay

## 2023-01-06 DIAGNOSIS — G8929 Other chronic pain: Secondary | ICD-10-CM | POA: Diagnosis not present

## 2023-01-06 DIAGNOSIS — Z79899 Other long term (current) drug therapy: Secondary | ICD-10-CM | POA: Insufficient documentation

## 2023-01-06 DIAGNOSIS — M25551 Pain in right hip: Secondary | ICD-10-CM | POA: Insufficient documentation

## 2023-01-06 DIAGNOSIS — M5431 Sciatica, right side: Secondary | ICD-10-CM | POA: Diagnosis not present

## 2023-01-06 DIAGNOSIS — Z7982 Long term (current) use of aspirin: Secondary | ICD-10-CM | POA: Diagnosis not present

## 2023-01-06 MED ORDER — OXYCODONE-ACETAMINOPHEN 5-325 MG PO TABS
2.0000 | ORAL_TABLET | Freq: Once | ORAL | Status: AC
  Administered 2023-01-06: 2 via ORAL
  Filled 2023-01-06: qty 2

## 2023-01-06 MED ORDER — OXYCODONE-ACETAMINOPHEN 5-325 MG PO TABS: 1.0000 | ORAL_TABLET | Freq: Four times a day (QID) | ORAL | 0 refills | Status: DC | PRN

## 2023-01-06 MED ORDER — METHOCARBAMOL 500 MG PO TABS: 500.0000 mg | ORAL_TABLET | Freq: Two times a day (BID) | ORAL | 0 refills | Status: DC

## 2023-01-06 NOTE — ED Provider Triage Note (Signed)
Emergency Medicine Provider Triage Evaluation Note  Michelle Park , a 72 y.o. female  was evaluated in triage.  Pt complains of right hip pain since yesterday.  Has taken Tylenol without significant relief.  Denies any significant injury.  No other complaints..  Review of Systems  Positive: As above Negative: As above  Physical Exam  BP 131/80   Pulse 92   Temp 98.4 F (36.9 C) (Oral)   Resp 16   SpO2 96%  Gen:   Awake, no distress  Resp:  Normal effort  MSK:   Moves extremities without difficulty  Other:    Medical Decision Making  Medically screening exam initiated at 2:50 PM.  Appropriate orders placed.  Michelle Park was informed that the remainder of the evaluation will be completed by another provider, this initial triage assessment does not replace that evaluation, and the importance of remaining in the ED until their evaluation is complete.     Michelle [place], PA-C 01/06/23 1451

## 2023-01-06 NOTE — Telephone Encounter (Signed)
Patient is currently at the hospital per EMR

## 2023-01-06 NOTE — ED Provider Notes (Signed)
[place] EMERGENCY DEPARTMENT AT [facility] Provider Note   CSN: [id number] Arrival date & time: 01/06/23  1426     History  Chief Complaint  Patient presents with   Hip Pain    Michelle Park is a 72 y.o. female.  72 year old female with history of chronic hip pain, spinal stenosis presents due to worsening right posterior buttock pain.  Pain is atraumatic.  No loss of bowel or bladder function.  No reported history of foot drop.  Does have history of sciatica on the left side.  Pain is sharp and worse when she tries to ambulate.  Denies any new focal weakness.  Medication is used without relief.       Home Medications Prior to Admission medications   Medication Sig Start Date End Date Taking? Authorizing Provider  ALPRAZolam Prudy Feeler) 0.5 MG tablet Take 1 tablet by mouth three times daily as needed 12/24/22   Joaquim Nam, MD  aspirin EC 81 MG tablet Take 81 mg by mouth daily. Swallow whole.    [provider]  calcium-vitamin D (OSCAL WITH D) 500-200 MG-UNIT per tablet Take 1 tablet by mouth 3 (three) times daily with meals. 1500 mg per day for low bone densit    [provider]  cholecalciferol (VITAMIN D3) 25 MCG (1000 UNIT) tablet Take 1,000 Units by mouth daily.    [provider]  clopidogrel (PLAVIX) 75 MG tablet Take 1 tablet (75 mg total) by mouth daily. 09/03/22   Joaquim Nam, MD  escitalopram (LEXAPRO) 20 MG tablet Take 1 tablet (20 mg total) by mouth daily. 11/25/22   Joaquim Nam, MD  fenofibrate 160 MG tablet TAKE 1 TABLET BY MOUTH ONCE DAILY AFTER SUPPER 11/25/22   Joaquim Nam, MD  losartan (COZAAR) 100 MG tablet Take 1 tablet (100 mg total) by mouth daily. 10/10/22   Joaquim Nam, MD  Multiple Vitamin (MULTIVITAMIN WITH MINERALS) TABS tablet Take 1 tablet by mouth daily after lunch. Centrum    [provider]  pantoprazole (PROTONIX) 40 MG tablet Take 1 tablet by mouth once daily 12/12/22   Joaquim Nam, MD  rosuvastatin (CRESTOR) 5 MG tablet Take 1 tablet (5 mg total) by mouth at bedtime. 09/03/22   Joaquim Nam, MD      Allergies    Atorvastatin, Chantix [varenicline], Hydrocodone, and Ibandronic acid    Review of Systems   Review of Systems  All other systems reviewed and are negative.   Physical Exam Updated Vital Signs BP (!) 104/58   Pulse 74   Temp 97.8 F (36.6 C) (Oral)   Resp 18   SpO2 96%  Physical Exam Vitals and nursing note reviewed.  Constitutional:      General: She is not in acute distress.    Appearance: Normal appearance. She is well-developed. She is not toxic-appearing.  HENT:     Head: Normocephalic and atraumatic.  Eyes:     General: Lids are normal.     Conjunctiva/sclera: Conjunctivae normal.     Pupils: Pupils are equal, round, and reactive to light.  Neck:     Thyroid: No thyroid mass.     Trachea: No tracheal deviation.  Cardiovascular:     Rate and Rhythm: Normal rate and regular rhythm.     Heart sounds: Normal heart sounds. No murmur heard.    No gallop.  Pulmonary:     Effort: Pulmonary effort is normal. No respiratory distress.  Breath sounds: Normal breath sounds. No stridor. No decreased breath sounds, wheezing, rhonchi or rales.  Abdominal:     General: There is no distension.     Palpations: Abdomen is soft.     Tenderness: There is no abdominal tenderness. There is no rebound.  Musculoskeletal:        General: No tenderness. Normal range of motion.     Cervical back: Normal range of motion and neck supple.       Legs:  Skin:    General: Skin is warm and dry.     Findings: No abrasion or rash.  Neurological:     Mental Status: She is alert and oriented to person, place, and time. Mental status is at baseline.     GCS: GCS eye subscore is 4. GCS verbal subscore is 5. GCS motor subscore is 6.     Cranial Nerves: No cranial nerve deficit.     Sensory: No sensory deficit.     Motor: Motor function is intact.      Comments: Patient with normal dorsal and plantarflexion of right foot.  Pain at sciatic notch.  Suspect sciatica.    Psychiatric:        Attention and Perception: Attention normal.        Speech: Speech normal.        Behavior: Behavior normal.     ED Results / Procedures / Treatments   Labs (all labs ordered are listed, but only abnormal results are displayed) Labs Reviewed - No data to display  EKG None  Radiology DG Hip Unilat W or Wo Pelvis 2-3 Views Right  Result Date: 01/06/2023 CLINICAL DATA:  Chronic bilateral hip pain. Pain worsened yesterday. Unable to walk. EXAM: DG HIP (WITH OR WITHOUT PELVIS) 2-3V RIGHT COMPARISON:  None Available. FINDINGS: There is no acute fracture or dislocation. Femoroacetabular alignment is normal bilaterally. There is minimal degenerative change about the hips. The SI joints and symphysis pubis are intact. There is no erosive change. The soft tissues are unremarkable. IMPRESSION: No acute finding in the hips. Minimal degenerative change bilaterally. Electronically Signed   By: Lesia Hausen M.D.   On: 01/06/2023 15:37    Procedures Procedures    Medications Ordered in ED Medications  oxyCODONE-acetaminophen (PERCOCET/ROXICET) 5-325 MG per tablet 2 tablet (has no administration in time range)    ED Course/ Medical Decision Making/ A&P                             Medical Decision Making Risk Prescription drug management.  X-ray of right hip and pelvis negative for acute fracture per my interpretation.  No focal weakness appreciated. Given 2 Percocet here and prescription for same and follow-up with her doctor as needed        Final Clinical Impression(s) / ED Diagnoses Final diagnoses:  None    Rx / DC Orders ED Discharge Orders     None         Lorre Nick, MD 01/06/23 2002

## 2023-01-06 NOTE — ED Triage Notes (Signed)
Pt reports chronic bilateral hip pain but pain yesterday that is far more severe than usual. Denies injury. States unable to walk. Trying ice packs, heating pads, meds, and topical creams without relief.

## 2023-01-06 NOTE — Telephone Encounter (Signed)
Duly noted, I will await the ER report.  Thanks.

## 2023-01-06 NOTE — Telephone Encounter (Signed)
Pt called in wants PCP to know she is going to the hospital stated she can't walk because  of the Hip pain

## 2023-01-07 ENCOUNTER — Telehealth: Payer: Self-pay

## 2023-01-07 NOTE — Transitions of Care (Post Inpatient/ED Visit) (Signed)
01/07/2023  Name: Michelle Park MRN: [id number] DOB: 1951-02-04  Today's TOC FU Call Status: Today's TOC FU Call Status:: Successful TOC FU Call Competed TOC FU Call Complete Date: 01/07/23  Transition Care Management Follow-up Telephone Call Date of Discharge: 01/06/23 Discharge Facility: [facility] Type of Discharge: Emergency Department Reason for ED Visit: Other: (Sciatica of right side) How have you been since you were released from the hospital?: Better Any questions or concerns?: No  Items Reviewed: Did you receive and understand the discharge instructions provided?: Yes Medications obtained,verified, and reconciled?: Yes (Medications Reviewed) Any new allergies since your discharge?: No Dietary orders reviewed?: NA Do you have support at home?: Yes  Medications Reviewed Today: Medications Reviewed Today     Reviewed by Merleen Nicely, LPN (Licensed Practical Nurse) on 01/07/23 at 1439  Med List Status: <None>   Medication Order Taking? Sig Documenting Provider Last Dose Status Informant  ALPRAZolam (XANAX) 0.5 MG tablet [id number] Yes Take 1 tablet by mouth three times daily as needed Joaquim Nam, MD Taking Active   aspirin EC 81 MG tablet [id number] Yes Take 81 mg by mouth daily. Swallow whole. [provider] Taking Active   calcium-vitamin D (OSCAL WITH D) 500-200 MG-UNIT per tablet [id number] Yes Take 1 tablet by mouth 3 (three) times daily with meals. 1500 mg per day for low bone densit [provider] Taking Active Self  cholecalciferol (VITAMIN D3) 25 MCG (1000 UNIT) tablet [id number] Yes Take 1,000 Units by mouth daily. [provider] Taking Active   clopidogrel (PLAVIX) 75 MG tablet [id number] Yes Take 1 tablet (75 mg total) by mouth daily. Joaquim Nam, MD Taking Active   escitalopram (LEXAPRO) 20 MG tablet [id number] Yes Take 1 tablet (20 mg total) by mouth daily. Joaquim Nam, MD Taking Active   fenofibrate 160 MG  tablet [id number] Yes TAKE 1 TABLET BY MOUTH ONCE DAILY AFTER SUPPER Joaquim Nam, MD Taking Active   losartan (COZAAR) 100 MG tablet [id number] Yes Take 1 tablet (100 mg total) by mouth daily. Joaquim Nam, MD Taking Active   methocarbamol (ROBAXIN) 500 MG tablet [id number] Yes Take 1 tablet (500 mg total) by mouth 2 (two) times daily. Lorre Nick, MD Taking Active   Multiple Vitamin (MULTIVITAMIN WITH MINERALS) TABS tablet [id number] Yes Take 1 tablet by mouth daily after lunch. Centrum [provider] Taking Active Self  oxyCODONE-acetaminophen (PERCOCET/ROXICET) 5-325 MG tablet [id number] Yes Take 1 tablet by mouth every 6 (six) hours as needed for severe pain. Lorre Nick, MD Taking Active   pantoprazole (PROTONIX) 40 MG tablet [id number] Yes Take 1 tablet by mouth once daily Joaquim Nam, MD Taking Active   rosuvastatin (CRESTOR) 5 MG tablet [id number] Yes Take 1 tablet (5 mg total) by mouth at bedtime. Joaquim Nam, MD Taking Active             Home Care and Equipment/Supplies: Were Home Health Services Ordered?: No Any new equipment or medical supplies ordered?: No  Functional Questionnaire: Do you need assistance with bathing/showering or dressing?: No Do you need assistance with meal preparation?: No Do you need assistance with eating?: No Do you have difficulty maintaining continence: No Do you need assistance with getting out of bed/getting out of a chair/moving?: No Do you have difficulty managing or taking your medications?: No  Follow up appointments reviewed: PCP Follow-up appointment confirmed?: No (refused) MD Provider Line Number:[phone] Given: Yes Specialist Hospital Follow-up appointment confirmed?: No Reason Specialist  Follow-Up Not Confirmed: Patient has Specialist Provider Number and will Call for Appointment Do you need transportation to your follow-up appointment?: No Do you understand care options if your condition(s) worsen?:  Yes-patient verbalized understanding    SIGNATURE  Woodfin Ganja LPN [facility] Nurse Health Advisor Direct Dial [phone]

## 2023-02-04 ENCOUNTER — Other Ambulatory Visit: Payer: Self-pay | Admitting: Orthopedic Surgery

## 2023-02-04 DIAGNOSIS — M545 Low back pain, unspecified: Secondary | ICD-10-CM

## 2023-02-10 ENCOUNTER — Other Ambulatory Visit: Payer: Self-pay | Admitting: Family Medicine

## 2023-02-10 NOTE — Telephone Encounter (Signed)
Medication: Rosuvastatin 5 mg  Directions: Take 1 tablet po qhs  Last given: 09/03/22 Number refills: 1 Last o/v: 12/22/22 (Acute) Follow up: Note listed  Labs: 08/01/22 (Care Everywhere)  Patient last seen for annual visit in 04/2022. Last lipid panel by our office was 04/25/22 and 08/01/22 by [facility]. Please advise

## 2023-02-11 NOTE — Telephone Encounter (Signed)
Sent. Thanks.   

## 2023-02-15 ENCOUNTER — Ambulatory Visit
Admission: RE | Admit: 2023-02-15 | Discharge: 2023-02-15 | Disposition: A | Discharge status: Home / Self Care | Payer: Medicare Other | Source: Ambulatory Visit | Attending: Orthopedic Surgery | Admitting: Orthopedic Surgery

## 2023-02-15 DIAGNOSIS — M545 Low back pain, unspecified: Secondary | ICD-10-CM

## 2023-02-17 ENCOUNTER — Other Ambulatory Visit: Payer: Self-pay | Admitting: Family Medicine

## 2023-02-17 NOTE — Telephone Encounter (Signed)
Refill request for ALPRAZolam 0.5 MG Oral Tablet   LOV - 12/22/22 Next OV - not scheduled Last refill - 12/24/22 #90/1

## 2023-02-18 NOTE — Telephone Encounter (Signed)
Sent. Thanks.   

## 2023-03-05 ENCOUNTER — Other Ambulatory Visit: Payer: Self-pay | Admitting: Family Medicine

## 2023-03-16 ENCOUNTER — Other Ambulatory Visit: Payer: Self-pay | Admitting: Family Medicine

## 2023-03-16 NOTE — Telephone Encounter (Signed)
Refill request for ALPRAZolam 0.5 MG Oral Tablet   LOV - 12/22/22 Next OV - not scheduled Last refill - 02/18/23 #90/0

## 2023-03-17 ENCOUNTER — Ambulatory Visit: Payer: Medicare Other | Admitting: Gastroenterology

## 2023-03-22 ENCOUNTER — Telehealth: Payer: Self-pay | Admitting: Family Medicine

## 2023-03-22 NOTE — Telephone Encounter (Signed)
Please check with patient and see if she is going to come back for labs and xray.  If so, please schedule lab visit.  Thanks.

## 2023-03-23 NOTE — Telephone Encounter (Signed)
Call can't be completed at this time. Will also send my chart to have patient call office.

## 2023-03-26 NOTE — Telephone Encounter (Signed)
Letter sent to patient to call back and schedule lab appt

## 2023-03-26 NOTE — Telephone Encounter (Signed)
Called patient number not right. Sent my chart to patent. No call back please advise

## 2023-03-26 NOTE — Telephone Encounter (Signed)
The only other option I can think of is to send a hard copy letter.  Please send next week if she doesn't call back in the meantime.  Thanks.

## 2023-04-13 ENCOUNTER — Telehealth: Payer: Self-pay | Admitting: Family Medicine

## 2023-04-13 NOTE — Telephone Encounter (Signed)
Pt called stating her orthopedic provider, Dr. Claria Dice, stated he needed approval from Dr. Para March regarding if it was fine for the pt to stop the meds, clopidogrel (PLAVIX) 75 MG tablet before the start of some inj? Pt asked if Dr. Para March received anything regarding this matter from Dr. Juanda Chance office, [facility]? Call back # [phone]

## 2023-04-13 NOTE — Telephone Encounter (Signed)
Please call ortho and get details regarding injection and possible timeline for holding plavix.  Thanks.

## 2023-04-14 ENCOUNTER — Telehealth: Payer: Self-pay | Admitting: Family Medicine

## 2023-04-14 NOTE — Telephone Encounter (Signed)
Prescription Request  04/14/2023  LOV: 12/22/2022  What is the name of the medication or equipment? ALPRAZolam (XANAX) 0.5 MG tablet   Have you contacted your pharmacy to request a refill? No   Which pharmacy would you like this sent to?  [id number] - [place], [place] - [address] [place] [address] Phone: [phone] Fax: [phone]     Patient notified that their request is being sent to the clinical staff for review and that they should receive a response within 2 business days.   Please advise at Mobile [phone] (mobile)

## 2023-04-14 NOTE — Telephone Encounter (Signed)
Called office patient is scheduled for ESI she will need to hold Plavix for 5-7 days before. They will give instructions on when to start back after procedure.  Just need to fax back if ok to hold on cover sheet to [phone]

## 2023-04-14 NOTE — Telephone Encounter (Signed)
Would be appropriate to hold plavix (and aspirin if needed) for 5 days prior to [facility].  Please send a letter.

## 2023-04-14 NOTE — Telephone Encounter (Signed)
Spoke with patient and she stated she saw ortho today and it was decided they were not doing injections anymore; they are going to be doing surgery instead. Nothing further needed at this time.

## 2023-04-14 NOTE — Telephone Encounter (Signed)
LOV - 12/22/22 NOV - 05/05/23 RF - 03/17/23 #90/0

## 2023-04-15 MED ORDER — ALPRAZOLAM 0.5 MG PO TABS: 0.5000 mg | ORAL_TABLET | Freq: Three times a day (TID) | ORAL | 0 refills | Status: DC | PRN

## 2023-04-15 NOTE — Addendum Note (Signed)
Addended by: Joaquim Nam on: 04/15/2023 04:05 PM   Modules accepted: Orders

## 2023-04-15 NOTE — Telephone Encounter (Signed)
Sent. Thanks.   

## 2023-04-20 ENCOUNTER — Other Ambulatory Visit: Payer: Self-pay | Admitting: Orthopedic Surgery

## 2023-04-28 ENCOUNTER — Ambulatory Visit: Payer: Medicare Other | Admitting: Internal Medicine

## 2023-05-01 NOTE — Pre-Procedure Instructions (Signed)
Surgical Instructions   Your procedure is scheduled on Wednesday, October 9th. Report to [facility] Main Entrance "A" at 05:30 A.M., then check in with the Admitting office. Any questions or running late day of surgery: call [phone]  Questions prior to your surgery date: call [phone], Monday-Friday, 8am-4pm. If you experience any cold or flu symptoms such as cough, fever, chills, shortness of breath, etc. between now and your scheduled surgery, please notify us at the above number.     Remember:  Do not eat after midnight the night before your surgery  You may drink clear liquids until 04:30 AM the morning of your surgery.   Clear liquids allowed are: Water, Non-Citrus Juices (without pulp), Carbonated Beverages, Clear Tea, Black Coffee Only (NO MILK, CREAM OR POWDERED CREAMER of any kind), and Gatorade.  Patient Instructions  The night before surgery:  No food after midnight. ONLY clear liquids after midnight  The day of surgery (if you do NOT have diabetes):  Drink ONE (1) Pre-Surgery Clear Ensure by 04:30 AM the morning of surgery. Drink in one sitting. Do not sip.  This drink was given to you during your hospital  pre-op appointment visit.  Nothing else to drink after completing the  Pre-Surgery Clear Ensure.         If you have questions, please contact your surgeon's office.    Take these medicines the morning of surgery with A SIP OF WATER  escitalopram (LEXAPRO)  fenofibrate  methocarbamol (ROBAXIN)  pantoprazole (PROTONIX)    May take these medicines IF NEEDED: ALPRAZolam (XANAX)  oxyCODONE-acetaminophen (PERCOCET/ROXICET)     Hold PLAVIX for 5 days prior to surgery. Last dose 10/3.  Follow your surgeon's instructions on when to stop Asprin.  If no instructions were given by your surgeon then you will need to call the office to get those instructions.     One week prior to surgery, STOP taking any Aleve, Naproxen, Ibuprofen, Motrin, Advil, Goody's,  BC's, all herbal medications, fish oil, and non-prescription vitamins.                     Do NOT Smoke (Tobacco/Vaping) for 24 hours prior to your procedure.  If you use a CPAP at night, you may bring your mask/headgear for your overnight stay.   You will be asked to remove any contacts, glasses, piercing's, hearing aid's, dentures/partials prior to surgery. Please bring cases for these items if needed.    Patients discharged the day of surgery will not be allowed to drive home, and someone needs to stay with them for 24 hours.  SURGICAL WAITING ROOM VISITATION Patients may have no more than 2 support people in the waiting area - these visitors may rotate.   Pre-op nurse will coordinate an appropriate time for 1 ADULT support person, who may not rotate, to accompany patient in pre-op.  Children under the age of 92 must have an adult with them who is not the patient and must remain in the main waiting area with an adult.  If the patient needs to stay at the hospital during part of their recovery, the visitor guidelines for inpatient rooms apply.  Please refer to the [facility] website for the visitor guidelines for any additional information.   If you received a COVID test during your pre-op visit  it is requested that you wear a mask when out in public, stay away from anyone that may not be feeling well and notify your surgeon if you  develop symptoms. If you have been in contact with anyone that has tested positive in the last 10 days please notify you surgeon.      Pre-operative 5 CHG Bathing Instructions   You can play a key role in reducing the risk of infection after surgery. Your skin needs to be as free of germs as possible. You can reduce the number of germs on your skin by washing with CHG (chlorhexidine gluconate) soap before surgery. CHG is an antiseptic soap that kills germs and continues to kill germs even after washing.   DO NOT use if you have an allergy to  chlorhexidine/CHG or antibacterial soaps. If your skin becomes reddened or irritated, stop using the CHG and notify one of our RNs at [phone].   Please shower with the CHG soap starting 4 days before surgery using the following schedule:     Please keep in mind the following:  DO NOT shave, including legs and underarms, starting the day of your first shower.   You may shave your face at any point before/day of surgery.  Place clean sheets on your bed the day you start using CHG soap. Use a clean washcloth (not used since being washed) for each shower. DO NOT sleep with pets once you start using the CHG.   CHG Shower Instructions:  Wash your face and private area with normal soap. If you choose to wash your hair, wash first with your normal shampoo.  After you use shampoo/soap, rinse your hair and body thoroughly to remove shampoo/soap residue.  Turn the water OFF and apply about 3 tablespoons (45 ml) of CHG soap to a CLEAN washcloth.  Apply CHG soap ONLY FROM YOUR NECK DOWN TO YOUR TOES (washing for 3-5 minutes)  DO NOT use CHG soap on face, private areas, open wounds, or sores.  Pay special attention to the area where your surgery is being performed.  If you are having back surgery, having someone wash your back for you may be helpful. Wait 2 minutes after CHG soap is applied, then you may rinse off the CHG soap.  Pat dry with a clean towel  Put on clean clothes/pajamas   If you choose to wear lotion, please use ONLY the CHG-compatible lotions on the back of this paper.   Additional instructions for the day of surgery: DO NOT APPLY any lotions, deodorants, cologne, or perfumes.   Do not bring valuables to the hospital. [facility] is not responsible for any belongings/valuables. Do not wear nail polish, gel polish, artificial nails, or any other type of covering on natural nails (fingers and toes) Do not wear jewelry or makeup Put on clean/comfortable clothes.  Please brush your  teeth.  Ask your nurse before applying any prescription medications to the skin.     CHG Compatible Lotions   Aveeno Moisturizing lotion  Cetaphil Moisturizing Cream  Cetaphil Moisturizing Lotion  Clairol Herbal Essence Moisturizing Lotion, Dry Skin  Clairol Herbal Essence Moisturizing Lotion, Extra Dry Skin  Clairol Herbal Essence Moisturizing Lotion, Normal Skin  Curel Age Defying Therapeutic Moisturizing Lotion with Alpha Hydroxy  Curel Extreme Care Body Lotion  Curel Soothing Hands Moisturizing Hand Lotion  Curel Therapeutic Moisturizing Cream, Fragrance-Free  Curel Therapeutic Moisturizing Lotion, Fragrance-Free  Curel Therapeutic Moisturizing Lotion, Original Formula  Eucerin Daily Replenishing Lotion  Eucerin Dry Skin Therapy Plus Alpha Hydroxy Crme  Eucerin Dry Skin Therapy Plus Alpha Hydroxy Lotion  Eucerin Original Crme  Eucerin Original Lotion  Eucerin Plus Crme Eucerin Plus  Lotion  Eucerin TriLipid Replenishing Lotion  Keri Anti-Bacterial Hand Lotion  Keri Deep Conditioning Original Lotion Dry Skin Formula Softly Scented  Keri Deep Conditioning Original Lotion, Fragrance Free Sensitive Skin Formula  Keri Lotion Fast Absorbing Fragrance Free Sensitive Skin Formula  Keri Lotion Fast Absorbing Softly Scented Dry Skin Formula  Keri Original Lotion  Keri Skin Renewal Lotion Keri Silky Smooth Lotion  Keri Silky Smooth Sensitive Skin Lotion  Nivea Body Creamy Conditioning Oil  Nivea Body Extra Enriched Lotion  Nivea Body Original Lotion  Nivea Body Sheer Moisturizing Lotion Nivea Crme  Nivea Skin Firming Lotion  NutraDerm 30 Skin Lotion  NutraDerm Skin Lotion  NutraDerm Therapeutic Skin Cream  NutraDerm Therapeutic Skin Lotion  ProShield Protective Hand Cream  Provon moisturizing lotion  Please read over the following fact sheets that you were given.

## 2023-05-04 ENCOUNTER — Other Ambulatory Visit: Payer: Self-pay

## 2023-05-04 ENCOUNTER — Inpatient Hospital Stay (HOSPITAL_COMMUNITY)
Admission: RE | Admit: 2023-05-04 | Discharge: 2023-05-04 | Disposition: A | Discharge status: Home / Self Care | Payer: Medicare Other | Source: Ambulatory Visit | Attending: Orthopedic Surgery

## 2023-05-04 ENCOUNTER — Encounter (HOSPITAL_COMMUNITY): Payer: Self-pay

## 2023-05-04 VITALS — BP 129/64 | HR 74 | Temp 98.2°F | Resp 18 | Ht 65.0 in | Wt 215.8 lb

## 2023-05-04 DIAGNOSIS — Z01818 Encounter for other preprocedural examination: Secondary | ICD-10-CM

## 2023-05-04 DIAGNOSIS — Z01812 Encounter for preprocedural laboratory examination: Secondary | ICD-10-CM | POA: Insufficient documentation

## 2023-05-04 DIAGNOSIS — I350 Nonrheumatic aortic (valve) stenosis: Secondary | ICD-10-CM | POA: Diagnosis not present

## 2023-05-04 HISTORY — DX: Occlusion and stenosis of left carotid artery: I65.22

## 2023-05-04 HISTORY — DX: Syncope and collapse: R55

## 2023-05-04 HISTORY — DX: Other specified postprocedural states: Z98.890

## 2023-05-04 HISTORY — DX: Nonrheumatic aortic (valve) stenosis: I35.0

## 2023-05-04 HISTORY — DX: Iron deficiency: E61.1

## 2023-05-04 HISTORY — DX: Nausea with vomiting, unspecified: R11.2

## 2023-05-04 LAB — BASIC METABOLIC PANEL
Anion gap: 8 (ref 5–15)
BUN: 22 mg/dL (ref 8–23)
CO2: 25 mmol/L (ref 22–32)
Calcium: 9.7 mg/dL (ref 8.9–10.3)
Chloride: 98 mmol/L (ref 98–111)
Creatinine, Ser: 1.13 mg/dL — ABNORMAL HIGH (ref 0.44–1.00)
GFR, Estimated: 52 mL/min — ABNORMAL LOW (ref >60)
Glucose, Bld: 97 mg/dL (ref 70–99)
Potassium: 4.1 mmol/L (ref 3.5–5.1)
Sodium: 131 mmol/L — ABNORMAL LOW (ref 135–145)

## 2023-05-04 LAB — CBC
HCT: 37.7 % (ref 36.0–46.0)
Hemoglobin: 12.4 g/dL (ref 12.0–15.0)
MCH: 30.4 pg (ref 26.0–34.0)
MCHC: 32.9 g/dL (ref 30.0–36.0)
MCV: 92.4 fL (ref 80.0–100.0)
Platelets: 353 10*3/uL (ref 150–400)
RBC: 4.08 MIL/uL (ref 3.87–5.11)
RDW: 13.2 % (ref 11.5–15.5)
WBC: 6.6 10*3/uL (ref 4.0–10.5)
nRBC: 0 % (ref 0.0–0.2)

## 2023-05-04 LAB — SURGICAL PCR SCREEN
MRSA, PCR: NEGATIVE
Staphylococcus aureus: NEGATIVE

## 2023-05-04 LAB — TYPE AND SCREEN
ABO/RH(D): A POS
Antibody Screen: NEGATIVE

## 2023-05-04 NOTE — Progress Notes (Signed)
PCP - Dr. Crawford Givens Cardiologist - Dr. Charlton Haws Endocrinologist- Dr. Carlus Pavlov  PPM/ICD - denies   Chest x-ray - 11/24/20 EKG - 09/23/22- [facility]- records requested Stress Test - 04/03/21 ECHO - 08/13/22- [facility] Cardiac Cath - denies  Sleep Study - denies   DM- denies  ASA/Blood Thinner Instructions: Hold ASA and Plavix 5 days prior to surgery, per PCP. Last dose 10/3   ERAS Protcol - yes PRE-SURGERY Ensure given at PAT  COVID TEST- n/a   Anesthesia review: yes, cardiac hx; Pt instructed to reach out to Dr. Fabio Bering office ASAP regarding cardiac clearance for surgery. James aware of this pt  Patient denies shortness of breath, fever, cough and chest pain at PAT appointment   All instructions explained to the patient, with a verbal understanding of the material. Patient agrees to go over the instructions while at home for a better understanding.  The opportunity to ask questions was provided.

## 2023-05-05 ENCOUNTER — Encounter (HOSPITAL_COMMUNITY): Payer: Self-pay | Admitting: Physician Assistant

## 2023-05-05 ENCOUNTER — Encounter: Payer: Medicare Other | Admitting: Family Medicine

## 2023-05-05 NOTE — Progress Notes (Signed)
Anesthesia Chart Review:  72 year old female with pertinent history including current smoker with associated mild COPD, CVA, GERD/Barrett's esophagus, HTN, PONV, moderate aortic stenosis, left carotid stenosis s/p CEA at [facility] 09/2022.  Follows with vascular surgery at [facility] for history of carotid stenosis. Admission 08/01/2022-08/02/2022 for branch retinal artery occlusion attributed to L ICA stenosis. Presented to ED with 1 day of acute onset vision impairment to her left eye. 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  Vascular surgery recommended CEA.  She underwent left carotid endarterectomy on 09/23/2022.  She had follow-up on 01/27/2023 with carotid duplex showing patent left ICA s/p CEA, no hemodynamically significant stenosis in the right ICA.  She was recommended to follow-up in 6 months.  Patient reports she was instructed by her PCP Dr. Para March to hold aspirin and Plavix 5 days prior to surgery.  Preop labs reviewed, mild hyponatremia sodium 131, creatinine mildly elevated 1.13, otherwise unremarkable.    TTE 08/13/2022 (Care Everywhere): INTERPRETATION  NORMAL LEFT VENTRICULAR SYSTOLIC FUNCTION   WITH MILD LVH  NORMAL RIGHT VENTRICULAR SYSTOLIC FUNCTION  MILD VALVULAR REGURGITATION (See above)  NO PERICARDIAL EFFUSION  MILD-MODERATE AORTIC STENOSIS (mean gradient 18.1 mmHg, valve area 1.3 cm) NEGATIVE SALINE CONTRAST STUDY  NO PRIOR STUDY   Nuclear stress 04/03/2021:   The study is normal. The study is low risk.   No ST deviation was noted.   LV perfusion is normal. There is no evidence of ischemia. There is no evidence of infarction.   Nuclear stress EF: 65 %. The left ventricular ejection fraction is normal (55-65%). Left ventricular function is normal. End diastolic cavity size is normal. End  systolic cavity size is normal.

## 2023-05-06 ENCOUNTER — Telehealth: Payer: Self-pay | Admitting: *Deleted

## 2023-05-06 NOTE — Anesthesia Preprocedure Evaluation (Signed)
Anesthesia Evaluation    Airway        Dental   Pulmonary Current Smoker          Cardiovascular hypertension,      Neuro/Psych    GI/Hepatic   Endo/Other    Renal/GU      Musculoskeletal   Abdominal   Peds  Hematology   Anesthesia Other Findings   Reproductive/Obstetrics                              Anesthesia Physical Anesthesia Plan  ASA:   Anesthesia Plan:    Post-op Pain Management:    Induction:   PONV Risk Score and Plan:   Airway Management Planned:   Additional Equipment:   Intra-op Plan:   Post-operative Plan:   Informed Consent:   Plan Discussed with:   Anesthesia Plan Comments: (PAT note by Antionette Poles, PA-C: 72 year old female with pertinent history including current smoker with associated mild COPD, CVA, GERD/Barrett's esophagus, HTN, PONV, mild-moderate aortic stenosis, left carotid stenosis s/p CEA at [facility] 09/2022.  Follows with vascular surgery at [facility] for history of carotid stenosis. Admission 08/01/2022-08/02/2022 for branch retinal artery occlusion attributed to L ICA stenosis. Presented to ED with 1 day of acute onset vision impairment to her left eye. 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  Vascular surgery recommended CEA.  She underwent left carotid endarterectomy on 09/23/2022.  She had follow-up on 01/27/2023 with carotid duplex showing patent left ICA s/p CEA, no hemodynamically significant stenosis in the right ICA.  She was recommended to follow-up in 6 months.  Patient reports she was instructed by her PCP Dr. Para March to hold aspirin and Plavix 5 days prior to surgery.  Preop labs reviewed, mild hyponatremia sodium 131, creatinine mildly elevated 1.13, otherwise  unremarkable.  Reviewed history with anesthesiologist Dr. Ace Gins. Recent echo 08/2022 showed normal biventricular function with some progression of aortic stenosis, however still mild by mean gradient 18.1 mmHg. She had a prior low risk nucelar stress test 03/2021. He advised that in the absence of CV symptoms, she can proceed as planned. Recommended she reestablish with cardiology for longitudinal followup, but does not require any explicit clearances. This was relayed to surgeon's office, pt in process of re establishing with cardiology.  EKG 09/24/22 (care everywhere): NSR. Rate 63.  TTE 08/13/2022 (Care Everywhere): INTERPRETATION  NORMAL LEFT VENTRICULAR SYSTOLIC FUNCTION   WITH MILD LVH  NORMAL RIGHT VENTRICULAR SYSTOLIC FUNCTION  MILD VALVULAR REGURGITATION (See above)  NO PERICARDIAL EFFUSION  MILD-MODERATE AORTIC STENOSIS (mean gradient 18.1 mmHg, valve area 1.3 cm) NEGATIVE SALINE CONTRAST STUDY  NO PRIOR STUDY   Nuclear stress 04/03/2021:   The study is normal. The study is low risk.   No ST deviation was noted.   LV perfusion is normal. There is no evidence of ischemia. There is no evidence of infarction.   Nuclear stress EF: 65 %. The left ventricular ejection fraction is normal (55-65%). Left ventricular function is normal. End diastolic cavity size is normal. End systolic cavity size is normal.   )         Anesthesia Quick Evaluation

## 2023-05-06 NOTE — Telephone Encounter (Signed)
1st attempt at scheduling in-office appt for preop clearance. [email or web address]

## 2023-05-06 NOTE — Telephone Encounter (Signed)
Pt scheduled for 05/07/23 with Edd Fabian, NP

## 2023-05-06 NOTE — Telephone Encounter (Signed)
   Pre-operative Risk Assessment    Patient Name: Michelle Park  DOB: 1951-04-05 MRN: [id number]    DATE OF LAST VISIT: 12/26/21 VIN BHAGAT DATE OF NEXT VISIT: NONE  Request for Surgical Clearance    Procedure:   RIGHT-SIDED LUMBAR 4-5 TRANSFORAMINAL LUMBAR INTERBODY FUSION DECOMPRESSION WITH INSTRUMENTATION AND ALLOGRAFT  Date of Surgery:  Clearance 05/13/23 URGENT                                Surgeon:  DR. MARK DUMONSKI Surgeon's Group or Practice Name:  [facility] Phone number:  [phone] ATTN: Tobey Bride Fax number:  [phone]   Type of Clearance Requested:   - Medical ; ASA AND PLAVIX    Type of Anesthesia:  General    Additional requests/questions:    [name]   05/06/2023, 10:01 AM

## 2023-05-06 NOTE — Telephone Encounter (Signed)
   Name: Michelle Park  DOB: September 27, 1950  MRN: [id number]  Primary Cardiologist: Charlton Haws, MD  Chart reviewed as part of pre-operative protocol coverage. Because of Dallas Scorsone Fettig's past medical history and time since last visit, she will require a follow-up in-office visit in order to better assess preoperative cardiovascular risk.  Pre-op covering staff: - Please schedule appointment and call patient to inform them. If patient already had an upcoming appointment within acceptable timeframe, please add "pre-op clearance" to the appointment notes so provider is aware. - Please contact requesting surgeon's office via preferred method (i.e, phone, fax) to inform them of need for appointment prior to surgery.  Patient has a history of carotid stenosis, left ICA aneurysm and guidance will come from prescribing provider and not cardiology.     Napoleon Form, Leodis Rains, NP  05/06/2023, 10:09 AM

## 2023-05-07 ENCOUNTER — Ambulatory Visit: Payer: Medicare Other | Admitting: General Practice

## 2023-05-07 NOTE — Telephone Encounter (Signed)
Patient cancelled her pre op appointment and note indicates that she will call back to reschedule. Will route to requesting surgeons office to make them aware.

## 2023-05-08 ENCOUNTER — Other Ambulatory Visit: Payer: Self-pay | Admitting: Family Medicine

## 2023-05-08 ENCOUNTER — Telehealth: Payer: Self-pay | Admitting: Family Medicine

## 2023-05-08 DIAGNOSIS — Z1212 Encounter for screening for malignant neoplasm of rectum: Secondary | ICD-10-CM

## 2023-05-08 DIAGNOSIS — Z1211 Encounter for screening for malignant neoplasm of colon: Secondary | ICD-10-CM

## 2023-05-08 NOTE — Telephone Encounter (Signed)
Patient called in asking did Dr Para March receive the surgical clearance from form [facility] for her back surgery. She said that they faxed it over ,but haven't got a response back. They are needing it back today so that herr surgery doesn't get postponed.   Contact person in case it Hasnt been received: Tobey Bride [phone]

## 2023-05-08 NOTE — Telephone Encounter (Signed)
Form was faxed back yesterday with cover sheet stating that patient needs OV before form will be done. Also written that patient had appt with Dr. Para March for her AWV on 05/05/23 and this could have been handled then but patient cancelled appt and moved it to 06/02/23. Cardiology appt was also cancelled during this time and not rescheduled.   Called donna back at [facility] and advised her of all of this. She was very rude the entire time on the phone with me. Before hanging up Lupita Leash stated she would call the patient.

## 2023-05-08 NOTE — Telephone Encounter (Signed)
Lupita Leash from [facility] contacted the office regarding this message, wanted to know if we received form and if it was complete. States they need to know today if possible whether or not to hold patient's plavix. Please advise number below

## 2023-05-11 ENCOUNTER — Ambulatory Visit: Payer: Medicare Other | Admitting: Family Medicine

## 2023-05-11 ENCOUNTER — Other Ambulatory Visit: Payer: Self-pay | Admitting: Family Medicine

## 2023-05-11 NOTE — Telephone Encounter (Signed)
Patient has appt today with Dr. Para March scheduled at 10:30 am. Will do form if patient comes to appt.

## 2023-05-11 NOTE — Telephone Encounter (Signed)
Refill request for  ALPRAZolam 0.5 MG Oral Tablet   LOV - 12/22/22 Next OV - 05/14/23 Last refill - 04/15/23 #90/0

## 2023-05-12 NOTE — Telephone Encounter (Signed)
Sent. Thanks.   

## 2023-05-13 ENCOUNTER — Ambulatory Visit (HOSPITAL_COMMUNITY): Admission: RE | Admit: 2023-05-13 | Payer: Medicare Other | Source: Home / Self Care | Admitting: Orthopedic Surgery

## 2023-05-13 SURGERY — TRANSFORAMINAL LUMBAR INTERBODY FUSION (TLIF) WITH PEDICLE SCREW FIXATION 1 LEVEL
Anesthesia: General | Laterality: Right

## 2023-05-14 ENCOUNTER — Encounter: Payer: Self-pay | Admitting: Family Medicine

## 2023-05-14 ENCOUNTER — Ambulatory Visit (INDEPENDENT_AMBULATORY_CARE_PROVIDER_SITE_OTHER): Payer: Medicare Other | Admitting: Family Medicine

## 2023-05-14 VITALS — BP 116/70 | HR 82 | Temp 97.8°F | Ht 65.0 in | Wt 219.0 lb

## 2023-05-14 DIAGNOSIS — R011 Cardiac murmur, unspecified: Secondary | ICD-10-CM | POA: Diagnosis not present

## 2023-05-14 DIAGNOSIS — M549 Dorsalgia, unspecified: Secondary | ICD-10-CM | POA: Diagnosis not present

## 2023-05-14 DIAGNOSIS — I10 Essential (primary) hypertension: Secondary | ICD-10-CM

## 2023-05-14 DIAGNOSIS — I6529 Occlusion and stenosis of unspecified carotid artery: Secondary | ICD-10-CM

## 2023-05-14 DIAGNOSIS — G8929 Other chronic pain: Secondary | ICD-10-CM

## 2023-05-14 NOTE — Progress Notes (Signed)
Plan for Procedure:   RIGHT-SIDED LUMBAR 4-5 TRANSFORAMINAL LUMBAR INTERBODY FUSION DECOMPRESSION WITH INSTRUMENTATION AND ALLOGRAFT  Echo 08/13/2022.    NORMAL LEFT VENTRICULAR SYSTOLIC FUNCTION   WITH MILD LVH NORMAL RIGHT VENTRICULAR SYSTOLIC FUNCTION MILD VALVULAR REGURGITATION (See above) NO PERICARDIAL EFFUSION MILD-MODERATE AORTIC STENOSIS NEGATIVE SALINE CONTRAST STUDY  H/o lower sodium noted, repeat labs pending.    Prev joint pain resolved.  D/w about not getting prev labs/hand xray done.  She agreed.   Smoking.  D/w pt. encouraged cessation.  01/2523 carotid u/s Impression:   Right carotid artery:  Duplex imaging demonstrates no evidence of a  hemodynamically significant stenosis. The internal carotid artery  demonstrates a less than 50% diameter reduction. The ICA velocities are stable when compared to the prior exam.Heavily calcified plaque with  shadowing is noted. The common carotid artery demonstrates a <50% stenosis. The external carotid artery demonstrates a <50% stenosis.   The right vertebral artery demonstrates antegrade flow. The subclavian artery is patent with biphasic flow. Please see velocities above.   Left carotid artery:  Duplex imaging demonstrates no evidence of a hemodynamically significant stenosis. Patent carotid artery s/p CEA. No evidence of recurrent or residual stenosis. The internal carotid artery demonstrates no evidence of stenosis. The ICA velocities are stable when compared to the prior exam. No significant plaque is noted. The common carotid artery demonstrates a <50% stenosis. The external carotid artery demonstrates a <50% stenosis.   The left vertebral artery demonstrates antegrade flow. The subclavian artery is patent with biphasic flow. Please see velocities above.   Discussed with patient about planned upcoming surgery.  She has progressive pain to the point of opting for surgery. Waking in pain.  Discussed her findings above.  There is no  expectation that delaying surgery would improve the situation with her back.  Discussed that we cannot "clear" the patient for surgery because we can never make the risk of surgery zero, but if she is having progressive life altering pain then she can be at the point where surgery is indicated, meaning she is appropriately low risk.  There is no expectation that waiting would improve her vascular risk.  Discussed smoking cessation in the meantime.  No CP.   Minimally SOB with weight gain and relative deconditioning.  No BLE edema.    Discussed with patient that I need input from cardiology.    EKG discussed with patient at office visit.  No acute changes.  Meds, vitals, and allergies reviewed.   ROS: Per HPI unless specifically indicated in ROS section   GEN: nad, alert and oriented HEENT: ncat NECK: supple w/o LA CV: rrr. SEM noted  PULM: ctab, no inc wob ABD: soft, +bs EXT: no edema SKIN: well perfused.

## 2023-05-14 NOTE — Patient Instructions (Signed)
Go to the lab on the way out.   If you have mychart we'll likely use that to update you.    Take care.  Glad to see you. I'll update your surgery clinic.

## 2023-05-15 LAB — BASIC METABOLIC PANEL
BUN: 20 mg/dL (ref 6–23)
CO2: 25 meq/L (ref 19–32)
Calcium: 10 mg/dL (ref 8.4–10.5)
Chloride: 97 meq/L (ref 96–112)
Creatinine, Ser: 1.06 mg/dL (ref 0.40–1.20)
GFR: 52.37 mL/min — ABNORMAL LOW (ref >60.00)
Glucose, Bld: 88 mg/dL (ref 70–99)
Potassium: 4.4 meq/L (ref 3.5–5.1)
Sodium: 130 meq/L — ABNORMAL LOW (ref 135–145)

## 2023-05-17 ENCOUNTER — Telehealth: Payer: Self-pay | Admitting: Family Medicine

## 2023-05-17 NOTE — Assessment & Plan Note (Addendum)
History noted.  Smoking cessation encouraged to decrease her risk in the perioperative period and going forward.  Continue Crestor in the meantime.

## 2023-05-17 NOTE — Telephone Encounter (Signed)
I think this patient is going to be appropriately low risk for surgery given her situation with her back (she has a compelling indication for surgery given her level of pain and I do not expect delaying surgery will improve her risk) but I would still like your input.  Many thanks.

## 2023-05-17 NOTE — Assessment & Plan Note (Signed)
With aortic stenosis but no chest pain and this would not prevent her from going through with surgery.  Discussed.

## 2023-05-17 NOTE — Assessment & Plan Note (Addendum)
Discussed with patient about planned upcoming surgery.  She has progressive pain to the point of opting for surgery. Waking in pain.  Discussed her findings above.  There is no expectation that delaying surgery would improve the situation with her back.  Discussed that we cannot "clear" the patient for surgery because we can never make the risk of surgery zero, but if she is having progressive life altering pain then she can be at the point where surgery is indicated, meaning she is appropriately low risk.  There is no expectation that waiting would improve her vascular risk.  Discussed smoking cessation in the meantime.  No CP.   Minimally SOB with weight gain and relative deconditioning.  No BLE edema.    I will ask for input from cardiology but in the meantime it appears that she would be appropriately low risk for surgery.  I would hold aspirin and Plavix for 5 days before surgery.  See notes on labs regarding sodium follow-up.

## 2023-05-18 NOTE — Telephone Encounter (Signed)
Please have her check with cardiology about preop eval.  Thanks.

## 2023-05-19 ENCOUNTER — Encounter: Payer: Self-pay | Admitting: Family Medicine

## 2023-05-19 ENCOUNTER — Telehealth: Payer: Self-pay | Admitting: Family Medicine

## 2023-05-19 MED ORDER — LANSOPRAZOLE 30 MG PO CPDR: 30.0000 mg | DELAYED_RELEASE_CAPSULE | Freq: Every day | ORAL | 3 refills | Status: DC

## 2023-05-19 NOTE — Telephone Encounter (Signed)
Left message to return call to our office.  

## 2023-05-19 NOTE — Telephone Encounter (Signed)
Patient called to refill medication omeprazole (PRILOSEC) 20 MG capsule , and wanted to switch this medication to lansoprazole (prevacid) 30mg . Sent for a 90 day supply to preferred [facility]. Patient says this is a cheaper alternative for the one she takes and is the one that she was supposed to be taking all along

## 2023-05-19 NOTE — Telephone Encounter (Signed)
error 

## 2023-05-19 NOTE — Telephone Encounter (Signed)
Sent. Thanks.   

## 2023-05-21 NOTE — Telephone Encounter (Signed)
Called patient reviewed all information and repeated back to me. Will call if any questions.  ? ?
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
## 2023-05-31 ENCOUNTER — Other Ambulatory Visit: Payer: Self-pay | Admitting: Family Medicine

## 2023-06-01 ENCOUNTER — Ambulatory Visit: Payer: Medicare Other | Attending: General Practice | Admitting: General Practice

## 2023-06-01 ENCOUNTER — Encounter: Payer: Self-pay | Admitting: General Practice

## 2023-06-01 VITALS — BP 116/72 | HR 89 | Ht 65.0 in | Wt 216.6 lb

## 2023-06-01 DIAGNOSIS — I350 Nonrheumatic aortic (valve) stenosis: Secondary | ICD-10-CM | POA: Diagnosis not present

## 2023-06-01 DIAGNOSIS — E785 Hyperlipidemia, unspecified: Secondary | ICD-10-CM

## 2023-06-01 DIAGNOSIS — I10 Essential (primary) hypertension: Secondary | ICD-10-CM | POA: Diagnosis not present

## 2023-06-01 DIAGNOSIS — Z01810 Encounter for preprocedural cardiovascular examination: Secondary | ICD-10-CM

## 2023-06-01 DIAGNOSIS — R0609 Other forms of dyspnea: Secondary | ICD-10-CM

## 2023-06-01 DIAGNOSIS — I6523 Occlusion and stenosis of bilateral carotid arteries: Secondary | ICD-10-CM

## 2023-06-01 DIAGNOSIS — Z01818 Encounter for other preprocedural examination: Secondary | ICD-10-CM

## 2023-06-01 NOTE — Patient Instructions (Addendum)
Medication Instructions:  The current medical regimen is effective;  continue present plan and medications as directed. Please refer to the Current Medication list given to you today.  *If you need a refill on your cardiac medications before your next appointment, please call your pharmacy*  Lab Work: NONE  Follow-Up: At [facility], you and your health needs are our priority.  As part of our continuing mission to provide you with exceptional heart care, we have created designated Provider Care Teams.  These Care Teams include your primary Cardiologist (physician) and Advanced Practice Providers (APPs -  Physician Assistants and Nurse Practitioners) who all work together to provide you with the care you need, when you need it.  Your next appointment:   12 month(s)  Provider:   Charlton Haws, MD  or Edd Fabian, FNP        Please try to avoid these triggers: Do not use any products that have nicotine or tobacco in them. These include cigarettes, e-cigarettes, and chewing tobacco. If you need help quitting, ask your doctor. Eat heart-healthy foods. Talk with your doctor about the right eating plan for you. Exercise regularly as told by your doctor. Stay hydrated Do not drink alcohol, Caffeine or chocolate. Lose weight if you are overweight. Do not use drugs, including cannabis    Steps to Quit Smoking Smoking tobacco is the leading cause of preventable death. It can affect almost every organ in the body. Smoking puts you and people around you at risk for many serious, long-lasting (chronic) diseases. Quitting smoking can be hard, but it is one of the best things that you can do for your health. It is never too late to quit. Do not give up if you cannot quit the first time. Some people need to try many times to quit. Do your best to stick to your quit plan, and talk with your doctor if you have any questions or concerns. How do I get ready to quit? Pick a date to quit. Set a date  within the next 2 weeks to give you time to prepare. Write down the reasons why you are quitting. Keep this list in places where you will see it often. Tell your family, friends, and co-workers that you are quitting. Their support is important. Talk with your doctor about the choices that may help you quit. Find out if your health insurance will pay for these treatments. Know the people, places, things, and activities that make you want to smoke (triggers). Avoid them. What first steps can I take to quit smoking? Throw away all cigarettes at home, at work, and in your car. Throw away the things that you use when you smoke, such as ashtrays and lighters. Clean your car. Empty the ashtray. Clean your home, including curtains and carpets. What can I do to help me quit smoking? Talk with your doctor about taking medicines and seeing a counselor. You are more likely to succeed when you do both. If you are pregnant or breastfeeding: Talk with your doctor about counseling or other ways to quit smoking. Do not take medicine to help you quit smoking unless your doctor tells you to. Quit right away Quit smoking completely, instead of slowly cutting back on how much you smoke over a period of time. Stopping smoking right away may be more successful than slowly quitting. Go to counseling. In-person is best if this is an option. You are more likely to quit if you go to counseling sessions regularly. Take  medicine You may take medicines to help you quit. Some medicines need a prescription, and some you can buy over-the-counter. Some medicines may contain a drug called nicotine to replace the nicotine in cigarettes. Medicines may: Help you stop having the desire to smoke (cravings). Help to stop the problems that come when you stop smoking (withdrawal symptoms). Your doctor may ask you to use: Nicotine patches, gum, or lozenges. Nicotine inhalers or sprays. Non-nicotine medicine that you take by  mouth. Find resources Find resources and other ways to help you quit smoking and remain smoke-free after you quit. They include: Online chats with a Veterinary surgeon. Phone quitlines. Printed Materials engineer. Support groups or group counseling. Text messaging programs. Mobile phone apps. Use apps on your mobile phone or tablet that can help you stick to your quit plan. Examples of free services include Quit Guide from the CDC and smokefree.gov  What can I do to make it easier to quit?  Talk to your family and friends. Ask them to support and encourage you. Call a phone quitline, such as 1-800-QUIT-NOW, reach out to support groups, or work with a Veterinary surgeon. Ask people who smoke to not smoke around you. Avoid places that make you want to smoke, such as: Bars. Parties. Smoke-break areas at work. Spend time with people who do not smoke. Lower the stress in your life. Stress can make you want to smoke. Try these things to lower stress: Getting regular exercise. Doing deep-breathing exercises. Doing yoga. Meditating. What benefits will I see if I quit smoking? Over time, you may have: A better sense of smell and taste. Less coughing and sore throat. A slower heart rate. Lower blood pressure. Clearer skin. Better breathing. Fewer sick days. Summary Quitting smoking can be hard, but it is one of the best things that you can do for your health. Do not give up if you cannot quit the first time. Some people need to try many times to quit. When you decide to quit smoking, make a plan to help you succeed. Quit smoking right away, not slowly over a period of time. When you start quitting, get help and support to keep you smoke-free. This information is not intended to replace advice given to you by your health care provider. Make sure you discuss any questions you have with your health care provider. Document Revised: 07/12/2021 Document Reviewed: 07/12/2021 Elsevier Patient Education  2024  [employer].

## 2023-06-02 ENCOUNTER — Encounter: Payer: Medicare Other | Admitting: Family Medicine

## 2023-06-05 ENCOUNTER — Other Ambulatory Visit: Payer: Self-pay | Admitting: Family Medicine

## 2023-06-09 NOTE — Telephone Encounter (Signed)
Patient called to f/u on med refill request. Advised that pcp is not in office and message will be sent to provider who is covering for him. Please advise, patient say she is out of this medication

## 2023-06-10 ENCOUNTER — Ambulatory Visit: Payer: Medicare Other | Admitting: Gastroenterology

## 2023-06-10 ENCOUNTER — Other Ambulatory Visit: Payer: Self-pay | Admitting: Family Medicine

## 2023-06-16 ENCOUNTER — Other Ambulatory Visit: Payer: Self-pay | Admitting: Orthopedic Surgery

## 2023-06-26 ENCOUNTER — Other Ambulatory Visit: Payer: Self-pay | Admitting: Family Medicine

## 2023-07-06 ENCOUNTER — Other Ambulatory Visit: Payer: Self-pay | Admitting: Family Medicine

## 2023-07-07 ENCOUNTER — Encounter: Payer: Medicare Other | Admitting: Family Medicine

## 2023-07-08 NOTE — Progress Notes (Signed)
Surgical Instructions   Your procedure is scheduled on Wednesday, December 11th, 2024. Report to [facility] Main Entrance "A" at 6:30 A.M., then check in with the Admitting office. Any questions or running late day of surgery: call [phone]  Questions prior to your surgery date: call [phone], Monday-Friday, 8am-4pm. If you experience any cold or flu symptoms such as cough, fever, chills, shortness of breath, etc. between now and your scheduled surgery, please notify us at the above number.     Remember:  Do not eat after midnight the night before your surgery  You may drink clear liquids until 5:30 the morning of your surgery.   Clear liquids allowed are: Water, Non-Citrus Juices (without pulp), Carbonated Beverages, Clear Tea (no milk, honey, etc.), Black Coffee Only (NO MILK, CREAM OR POWDERED CREAMER of any kind), and Gatorade.  Patient Instructions  The night before surgery:  No food after midnight. ONLY clear liquids after midnight  The day of surgery (if you do NOT have diabetes):  Drink ONE (1) Pre-Surgery Clear Ensure by 5:30 the morning of surgery. Drink in one sitting. Do not sip.  This drink was given to you during your hospital  pre-op appointment visit.  Nothing else to drink after completing the  Pre-Surgery Clear Ensure.         If you have questions, please contact your surgeon's office.     Take these medicines the morning of surgery with A SIP OF WATER: Escitalopram (Lexapro) Lansoprazole (Prevacid)   May take these medicines IF NEEDED: Alprazolam (Xanax) Hydrocodone-acetaminophen (Norco)  Per your cardiologist instructions, Plavix should be helpd 5-7 days prior to your procedure.  Last dose should be Thursday, December 5th.  Per your cardiologist instructions, Aspirin should be held 7 days prior to your procedure so please stop taking your Aspirin.      One week prior to surgery, STOP taking Aleve, Naproxen, Ibuprofen, Motrin, Advil,  Goody's, BC's, all herbal medications, fish oil, and non-prescription vitamins.                     Do NOT Smoke (Tobacco/Vaping) for 24 hours prior to your procedure.  If you use a CPAP at night, you may bring your mask/headgear for your overnight stay.   You will be asked to remove any contacts, glasses, piercing's, hearing aid's, dentures/partials prior to surgery. Please bring cases for these items if needed.    Patients discharged the day of surgery will not be allowed to drive home, and someone needs to stay with them for 24 hours.  SURGICAL WAITING ROOM VISITATION Patients may have no more than 2 support people in the waiting area - these visitors may rotate.   Pre-op nurse will coordinate an appropriate time for 1 ADULT support person, who may not rotate, to accompany patient in pre-op.  Children under the age of 55 must have an adult with them who is not the patient and must remain in the main waiting area with an adult.  If the patient needs to stay at the hospital during part of their recovery, the visitor guidelines for inpatient rooms apply.  Please refer to the [facility] website for the visitor guidelines for any additional information.   If you received a COVID test during your pre-op visit  it is requested that you wear a mask when out in public, stay away from anyone that may not be feeling well and notify your surgeon if you develop symptoms. If you have been in  contact with anyone that has tested positive in the last 10 days please notify you surgeon.      Pre-operative 5 CHG Bathing Instructions   You can play a key role in reducing the risk of infection after surgery. Your skin needs to be as free of germs as possible. You can reduce the number of germs on your skin by washing with CHG (chlorhexidine gluconate) soap before surgery. CHG is an antiseptic soap that kills germs and continues to kill germs even after washing.   DO NOT use if you have an allergy to  chlorhexidine/CHG or antibacterial soaps. If your skin becomes reddened or irritated, stop using the CHG and notify one of our RNs at [phone].   Please shower with the CHG soap starting 4 days before surgery using the following schedule:     Please keep in mind the following:  DO NOT shave, including legs and underarms, starting the day of your first shower.   You may shave your face at any point before/day of surgery.  Place clean sheets on your bed the day you start using CHG soap. Use a clean washcloth (not used since being washed) for each shower. DO NOT sleep with pets once you start using the CHG.   CHG Shower Instructions:  Wash your face and private area with normal soap. If you choose to wash your hair, wash first with your normal shampoo.  After you use shampoo/soap, rinse your hair and body thoroughly to remove shampoo/soap residue.  Turn the water OFF and apply about 3 tablespoons (45 ml) of CHG soap to a CLEAN washcloth.  Apply CHG soap ONLY FROM YOUR NECK DOWN TO YOUR TOES (washing for 3-5 minutes)  DO NOT use CHG soap on face, private areas, open wounds, or sores.  Pay special attention to the area where your surgery is being performed.  If you are having back surgery, having someone wash your back for you may be helpful. Wait 2 minutes after CHG soap is applied, then you may rinse off the CHG soap.  Pat dry with a clean towel  Put on clean clothes/pajamas   If you choose to wear lotion, please use ONLY the CHG-compatible lotions on the back of this paper.   Additional instructions for the day of surgery: DO NOT APPLY any lotions, deodorants, cologne, or perfumes.   Do not bring valuables to the hospital. [facility] is not responsible for any belongings/valuables. Do not wear nail polish, gel polish, artificial nails, or any other type of covering on natural nails (fingers and toes) Do not wear jewelry or makeup Put on clean/comfortable clothes.  Please brush your  teeth.  Ask your nurse before applying any prescription medications to the skin.     CHG Compatible Lotions   Aveeno Moisturizing lotion  Cetaphil Moisturizing Cream  Cetaphil Moisturizing Lotion  Clairol Herbal Essence Moisturizing Lotion, Dry Skin  Clairol Herbal Essence Moisturizing Lotion, Extra Dry Skin  Clairol Herbal Essence Moisturizing Lotion, Normal Skin  Curel Age Defying Therapeutic Moisturizing Lotion with Alpha Hydroxy  Curel Extreme Care Body Lotion  Curel Soothing Hands Moisturizing Hand Lotion  Curel Therapeutic Moisturizing Cream, Fragrance-Free  Curel Therapeutic Moisturizing Lotion, Fragrance-Free  Curel Therapeutic Moisturizing Lotion, Original Formula  Eucerin Daily Replenishing Lotion  Eucerin Dry Skin Therapy Plus Alpha Hydroxy Crme  Eucerin Dry Skin Therapy Plus Alpha Hydroxy Lotion  Eucerin Original Crme  Eucerin Original Lotion  Eucerin Plus Crme Eucerin Plus Lotion  Eucerin TriLipid Replenishing Lotion  Keri Anti-Bacterial Hand Lotion  Keri Deep Conditioning Original Lotion Dry Skin Formula Softly Scented  Keri Deep Conditioning Original Lotion, Fragrance Free Sensitive Skin Formula  Keri Lotion Fast Absorbing Fragrance Free Sensitive Skin Formula  Keri Lotion Fast Absorbing Softly Scented Dry Skin Formula  Keri Original Lotion  Keri Skin Renewal Lotion Keri Silky Smooth Lotion  Keri Silky Smooth Sensitive Skin Lotion  Nivea Body Creamy Conditioning Oil  Nivea Body Extra Enriched Lotion  Nivea Body Original Lotion  Nivea Body Sheer Moisturizing Lotion Nivea Crme  Nivea Skin Firming Lotion  NutraDerm 30 Skin Lotion  NutraDerm Skin Lotion  NutraDerm Therapeutic Skin Cream  NutraDerm Therapeutic Skin Lotion  ProShield Protective Hand Cream  Provon moisturizing lotion  Please read over the following fact sheets that you were given.

## 2023-07-09 ENCOUNTER — Encounter: Payer: Medicare Other | Admitting: Family Medicine

## 2023-07-09 ENCOUNTER — Encounter (HOSPITAL_COMMUNITY)
Admission: RE | Admit: 2023-07-09 | Discharge: 2023-07-09 | Disposition: A | Discharge status: Home / Self Care | Payer: Medicare Other | Source: Ambulatory Visit | Attending: Orthopedic Surgery | Admitting: Orthopedic Surgery

## 2023-07-09 ENCOUNTER — Encounter (HOSPITAL_COMMUNITY): Payer: Self-pay

## 2023-07-09 ENCOUNTER — Other Ambulatory Visit: Payer: Self-pay

## 2023-07-09 VITALS — BP 152/71 | HR 81 | Temp 98.3°F | Resp 17 | Ht 65.0 in | Wt 214.0 lb

## 2023-07-09 DIAGNOSIS — E871 Hypo-osmolality and hyponatremia: Secondary | ICD-10-CM | POA: Insufficient documentation

## 2023-07-09 DIAGNOSIS — F411 Generalized anxiety disorder: Secondary | ICD-10-CM | POA: Insufficient documentation

## 2023-07-09 DIAGNOSIS — K219 Gastro-esophageal reflux disease without esophagitis: Secondary | ICD-10-CM | POA: Insufficient documentation

## 2023-07-09 DIAGNOSIS — Z8673 Personal history of transient ischemic attack (TIA), and cerebral infarction without residual deficits: Secondary | ICD-10-CM | POA: Insufficient documentation

## 2023-07-09 DIAGNOSIS — Z01812 Encounter for preprocedural laboratory examination: Secondary | ICD-10-CM | POA: Insufficient documentation

## 2023-07-09 DIAGNOSIS — I6529 Occlusion and stenosis of unspecified carotid artery: Secondary | ICD-10-CM | POA: Diagnosis not present

## 2023-07-09 DIAGNOSIS — F17210 Nicotine dependence, cigarettes, uncomplicated: Secondary | ICD-10-CM | POA: Insufficient documentation

## 2023-07-09 DIAGNOSIS — I350 Nonrheumatic aortic (valve) stenosis: Secondary | ICD-10-CM | POA: Diagnosis not present

## 2023-07-09 DIAGNOSIS — Z01818 Encounter for other preprocedural examination: Secondary | ICD-10-CM

## 2023-07-09 HISTORY — DX: Bell's palsy: G51.0

## 2023-07-09 LAB — TYPE AND SCREEN
ABO/RH(D): A POS
Antibody Screen: NEGATIVE

## 2023-07-09 LAB — BASIC METABOLIC PANEL
Anion gap: 8 (ref 5–15)
BUN: 16 mg/dL (ref 8–23)
CO2: 24 mmol/L (ref 22–32)
Calcium: 9.8 mg/dL (ref 8.9–10.3)
Chloride: 99 mmol/L (ref 98–111)
Creatinine, Ser: 1.05 mg/dL — ABNORMAL HIGH (ref 0.44–1.00)
GFR, Estimated: 56 mL/min — ABNORMAL LOW (ref >60)
Glucose, Bld: 100 mg/dL — ABNORMAL HIGH (ref 70–99)
Potassium: 4.3 mmol/L (ref 3.5–5.1)
Sodium: 131 mmol/L — ABNORMAL LOW (ref 135–145)

## 2023-07-09 LAB — CBC
HCT: 38 % (ref 36.0–46.0)
Hemoglobin: 12.3 g/dL (ref 12.0–15.0)
MCH: 28.8 pg (ref 26.0–34.0)
MCHC: 32.4 g/dL (ref 30.0–36.0)
MCV: 89 fL (ref 80.0–100.0)
Platelets: 374 10*3/uL (ref 150–400)
RBC: 4.27 MIL/uL (ref 3.87–5.11)
RDW: 14.5 % (ref 11.5–15.5)
WBC: 7.1 10*3/uL (ref 4.0–10.5)
nRBC: 0 % (ref 0.0–0.2)

## 2023-07-09 LAB — SURGICAL PCR SCREEN
MRSA, PCR: NEGATIVE
Staphylococcus aureus: NEGATIVE

## 2023-07-09 NOTE — Progress Notes (Signed)
SDW CALL  Patient was given pre-op instructions over the phone. The opportunity was given for the patient to ask questions. No further questions asked. Patient verbalized understanding of instructions given.   PCP - Dr. Crawford Givens Cardiologist - Edd Fabian Cardiology clearance note - 06/01/23  PPM/ICD - denies Device Orders - n/a Rep Notified - n/a  Chest x-ray - denies EKG - 05/14/23 Stress Test - 04/03/21 ECHO - 01/08/22 Cardiac Cath - denies  Sleep Study - denies CPAP - n/a  Fasting Blood Sugar - n/a   Last dose of GLP1 agonist-  n/a GLP1 instructions: n/a  Blood Thinner Instructions: Per cardiologist, Plavix to be held 5-7 days prior to procedure  Patient states that last dose is 12/5 Aspirin Instructions:  per cardiologist, Aspirin to be held 7 days prior to procedure and last dose 12/3.  ERAS Protcol - clears until 0530 PRE-SURGERY Ensure- to be completed by 0530  COVID TEST- n/a   Anesthesia review: yes-   Patient denies shortness of breath, fever, cough and chest pain over the phone call   All instructions explained to the patient, with a verbal understanding of the material. Patient agrees to go over the instructions while at home for a better understanding.

## 2023-07-10 NOTE — Anesthesia Preprocedure Evaluation (Signed)
Anesthesia Evaluation  Patient identified by MRN, date of birth, ID band Patient awake    Reviewed: Allergy & Precautions, NPO status , Patient's Chart, lab work & pertinent test results, reviewed documented beta blocker date and time   History of Anesthesia Complications (+) PONV and history of anesthetic complications  Airway Mallampati: II  TM Distance: >3 FB     Dental  (+) Chipped, Dental Advisory Given,    Pulmonary shortness of breath and with exertion, COPD, Current Smoker and Patient abstained from smoking.   Pulmonary exam normal breath sounds clear to auscultation       Cardiovascular hypertension, Pt. on medications + Peripheral Vascular Disease and + DOE  Normal cardiovascular exam+ Valvular Problems/Murmurs AS  Rhythm:Regular Rate:Normal  Left ICA stenosis S/P Left CEA 09/23/22  Nuclear Stress Test 04/03/21  The study is normal. The study is low risk.   No ST deviation was noted.   LV perfusion is normal. There is no evidence of ischemia. There is no evidence of infarction.   Nuclear stress EF: 65 %. The left ventricular ejection fraction is normal (55-65%). Left ventricular function is normal. End diastolic cavity size is normal. End systolic cavity size is normal.   Echo 01/08/22 1. Left ventricular ejection fraction, by estimation, is 55 to 60%. Left  ventricular ejection fraction by 3D volume is 58 %. The left ventricle has  normal function. The left ventricle has no regional wall motion  abnormalities. There is mild concentric  left ventricular hypertrophy. Left ventricular diastolic parameters are  consistent with Grade II diastolic dysfunction (pseudonormalization).  Elevated left atrial pressure. The average left ventricular global  longitudinal strain is -16.7 %. The global  longitudinal strain is abnormal.   2. Right ventricular systolic function is normal. The right ventricular  size is normal.  Tricuspid regurgitation signal is inadequate for assessing  PA pressure.   3. Left atrial size was mildly dilated.   4. The mitral valve is normal in structure. No evidence of mitral valve  regurgitation.   5. The aortic valve is tricuspid. There is mild calcification of the  aortic valve. There is mild thickening of the aortic valve. Aortic valve  regurgitation is mild. Mild aortic valve stenosis.   EKG 05/14/23 NSR, Normal    Neuro/Psych  Headaches PSYCHIATRIC DISORDERS Anxiety Depression    Restless legs syndrome  Neuromuscular disease CVA    GI/Hepatic Neg liver ROS,GERD  Medicated,,  Endo/Other  Hyperlipidemia Obesity  Renal/GU Renal InsufficiencyRenal disease  negative genitourinary   Musculoskeletal  (+) Arthritis , Osteoarthritis,  Fibromyalgia -Lumbar spinal stenosis Chronic low back pain   Abdominal  (+) + obese  Peds  Hematology  (+) Blood dyscrasia, anemia Plavix therapy- last dose 12/6   Anesthesia Other Findings   Reproductive/Obstetrics                             Anesthesia Physical Anesthesia Plan  ASA: 3  Anesthesia Plan: General   Post-op Pain Management: Dilaudid IV, Precedex and Tylenol PO (pre-op)*   Induction: Intravenous  PONV Risk Score and Plan: 4 or greater and Treatment may vary due to age or medical condition, Ondansetron, Dexamethasone and Midazolam  Airway Management Planned: Oral ETT  Additional Equipment: None  Intra-op Plan:   Post-operative Plan: Extubation in OR  Informed Consent: I have reviewed the patients History and Physical, chart, labs and discussed the procedure including the risks, benefits and alternatives for the  proposed anesthesia with the patient or authorized representative who has indicated his/her understanding and acceptance.     Dental advisory given  Plan Discussed with: CRNA and Anesthesiologist  Anesthesia Plan Comments: (PAT note by Antionette Poles, PA-C: 72 year old  female with pertinent history including postoperative nausea and vomiting, GERD, anxiety, pression, fibromyalgia, CVA, carotid stenosis s/p left CEA February 2024, headaches, current everyday smoker with associated COPD (not on any daily inhaled medications).  Follows with vascular surgeon Dr. Pricilla Holm at [facility] for history of left ICA stenosis.  She underwent left CEA 09/23/2022.  She was seen in follow-up on 01/27/2023 and duplex at that time demonstrated no residual stenosis.  She was recommended to follow-up in 6 months.  Seen by PCP Dr. Para March on 05/14/2023 for preop eval.  Per note, "She has progressive pain to the point of opting for surgery. Waking in pain.  Discussed her findings above.  There is no expectation that delaying surgery would improve the situation with her back.  Discussed that we cannot "clear" the patient for surgery because we can never make the risk of surgery zero, but if she is having progressive life altering pain then she can be at the point where surgery is indicated, meaning she is appropriately low risk.  There is no expectation that waiting would improve her vascular risk.  Discussed smoking cessation in the meantime.  No CP.   Minimally SOB with weight gain and relative deconditioning.  No BLE edema.  Discussed with patient that I need input from cardiology."  Follows with cardiology for history of mild to moderate aortic stenosis (mean gradient 18.1 mmHg by echo 08/2022).  She was last seen by Edd Fabian, NP on 06/01/2023 for preop evaluation.  Per note, "Chart reviewed as part of pre-operative protocol coverage. Given past medical history and time since last visit, based on ACC/AHA guidelines, BRISTOL GAUTHREAUX would be at acceptable risk for the planned procedure without further cardiovascular testing. Patient was advised that if she develops new symptoms prior to surgery to contact our office to arrange a follow-up appointment.  He verbalized understanding. Her RCRI is moderate  risk, 6.6% risk of major cardiac event.  She is able to complete greater than 4 METS of physical activity. Her aspirin may be held for 7 days prior to her procedure.  Her Plavix may be held for 5 to 7 days prior to her procedure.  Please resume aspirin and Plavix as soon as hemostasis is achieved."  Patient reports last dose Plavix 07/09/2023.  Preop labs reviewed, mild hyponatremia with sodium 131, otherwise unremarkable.  EKG 05/14/2023: NSR.  Rate 73.  Carotid duplex 01/27/2023 (Care Everywhere): Impression:   Right carotid artery: Duplex imaging demonstrates no evidence of a  hemodynamically significant stenosis. The internal carotid artery  demonstrates a less than 50% diameter reduction. The ICA velocities are  stable when compared to the prior exam.Heavily calcified plaque with  shadowing is noted. The common carotid artery demonstrates a <50%  stenosis. The external carotid artery demonstrates a <50% stenosis.   The right vertebral artery demonstrates antegrade flow. The subclavian  artery is patent with biphasic flow. Please see velocities above.   Left carotid artery: Duplex imaging demonstrates no evidence of a  hemodynamically significant stenosis. Patent carotid artery s/p CEA. No  evidence of recurrent or residual stenosis. The internal carotid artery  demonstrates no evidence of stenosis. The ICA velocities are stable when  compared to the prior exam. No significant plaque is noted. The common  carotid artery demonstrates a <50% stenosis. The external carotid artery  demonstrates a <50% stenosis.   TTE 08/13/2022 (Care Everywhere): INTERPRETATION  NORMAL LEFT VENTRICULAR SYSTOLIC FUNCTION  WITH MILD LVH  NORMAL RIGHT VENTRICULAR SYSTOLIC FUNCTION  MILD VALVULAR REGURGITATION (See above)  NO PERICARDIAL EFFUSION  MILD-MODERATE AORTIC STENOSIS  NEGATIVE SALINE CONTRAST STUDY  NO PRIOR STUDY   )        Anesthesia Quick Evaluation

## 2023-07-10 NOTE — Progress Notes (Signed)
Anesthesia Chart Review:  72 year old female with pertinent history including postoperative nausea and vomiting, GERD, anxiety, pression, fibromyalgia, CVA, carotid stenosis s/p left CEA February 2024, headaches, current everyday smoker with associated COPD (not on any daily inhaled medications).  Follows with vascular surgeon Dr. Pricilla Holm at [facility] for history of left ICA stenosis.  She underwent left CEA 09/23/2022.  She was seen in follow-up on 01/27/2023 and duplex at that time demonstrated no residual stenosis.  She was recommended to follow-up in 6 months.  Seen by PCP Dr. Para March on 05/14/2023 for preop eval.  Per note, "She has progressive pain to the point of opting for surgery. Waking in pain.  Discussed her findings above.  There is no expectation that delaying surgery would improve the situation with her back.  Discussed that we cannot "clear" the patient for surgery because we can never make the risk of surgery zero, but if she is having progressive life altering pain then she can be at the point where surgery is indicated, meaning she is appropriately low risk.  There is no expectation that waiting would improve her vascular risk.  Discussed smoking cessation in the meantime.  No CP.   Minimally SOB with weight gain and relative deconditioning.  No BLE edema.  Discussed with patient that I need input from cardiology."  Follows with cardiology for history of mild to moderate aortic stenosis (mean gradient 18.1 mmHg by echo 08/2022).  She was last seen by Edd Fabian, NP on 06/01/2023 for preop evaluation.  Per note, "Chart reviewed as part of pre-operative protocol coverage. Given past medical history and time since last visit, based on ACC/AHA guidelines, BABLI DERRING would be at acceptable risk for the planned procedure without further cardiovascular testing. Patient was advised that if she develops new symptoms prior to surgery to contact our office to arrange a follow-up appointment.  He  verbalized understanding. Her RCRI is moderate risk, 6.6% risk of major cardiac event.  She is able to complete greater than 4 METS of physical activity. Her aspirin may be held for 7 days prior to her procedure.  Her Plavix may be held for 5 to 7 days prior to her procedure.  Please resume aspirin and Plavix as soon as hemostasis is achieved."  Patient reports last dose Plavix 07/09/2023.  Preop labs reviewed, mild hyponatremia with sodium 131, otherwise unremarkable.  EKG 05/14/2023: NSR.  Rate 73.  Carotid duplex 01/27/2023 (Care Everywhere): Impression:   Right carotid artery:  Duplex imaging demonstrates no evidence of a  hemodynamically significant stenosis. The internal carotid artery  demonstrates a less than 50% diameter reduction. The ICA velocities are  stable when compared to the prior exam.Heavily calcified plaque with  shadowing is noted. The common carotid artery demonstrates a <50%  stenosis. The external carotid artery demonstrates a <50% stenosis.   The right vertebral artery demonstrates antegrade flow. The subclavian  artery is patent with biphasic flow. Please see velocities above.   Left carotid artery:  Duplex imaging demonstrates no evidence of a  hemodynamically significant stenosis. Patent carotid artery s/p CEA. No  evidence of recurrent or residual stenosis. The internal carotid artery  demonstrates no evidence of stenosis. The ICA velocities are stable when  compared to the prior exam. No significant plaque is noted. The common  carotid artery demonstrates a <50% stenosis. The external carotid artery  demonstrates a <50% stenosis.   TTE 08/13/2022 (Care Everywhere): INTERPRETATION  NORMAL LEFT VENTRICULAR SYSTOLIC FUNCTION   WITH MILD LVH  NORMAL RIGHT VENTRICULAR SYSTOLIC FUNCTION  MILD VALVULAR REGURGITATION (See above)  NO PERICARDIAL EFFUSION  MILD-MODERATE AORTIC STENOSIS  NEGATIVE SALINE CONTRAST STUDY  NO PRIOR STUDY     Zannie Cove [facility] Short Stay Center/Anesthesiology Phone [phone] 07/10/2023 10:35 AM

## 2023-07-14 ENCOUNTER — Encounter (HOSPITAL_COMMUNITY): Payer: Self-pay | Admitting: Orthopedic Surgery

## 2023-07-15 ENCOUNTER — Ambulatory Visit (HOSPITAL_COMMUNITY): Payer: Medicare Other | Admitting: Physician Assistant

## 2023-07-15 ENCOUNTER — Other Ambulatory Visit: Payer: Self-pay

## 2023-07-15 ENCOUNTER — Encounter (HOSPITAL_COMMUNITY): Payer: Self-pay | Admitting: Orthopedic Surgery

## 2023-07-15 ENCOUNTER — Observation Stay (HOSPITAL_COMMUNITY)
Admission: RE | Admit: 2023-07-15 | Discharge: 2023-07-16 | Disposition: A | Discharge status: Home / Self Care | Payer: Medicare Other | Attending: Orthopedic Surgery | Admitting: Orthopedic Surgery

## 2023-07-15 ENCOUNTER — Ambulatory Visit (HOSPITAL_COMMUNITY): Payer: Medicare Other

## 2023-07-15 ENCOUNTER — Encounter (HOSPITAL_COMMUNITY)
Admission: RE | Disposition: A | Discharge status: Home / Self Care | Payer: Self-pay | Source: Home / Self Care | Attending: Orthopedic Surgery

## 2023-07-15 DIAGNOSIS — M4316 Spondylolisthesis, lumbar region: Principal | ICD-10-CM | POA: Insufficient documentation

## 2023-07-15 DIAGNOSIS — Z7902 Long term (current) use of antithrombotics/antiplatelets: Secondary | ICD-10-CM | POA: Diagnosis not present

## 2023-07-15 DIAGNOSIS — Z7982 Long term (current) use of aspirin: Secondary | ICD-10-CM | POA: Diagnosis not present

## 2023-07-15 DIAGNOSIS — J449 Chronic obstructive pulmonary disease, unspecified: Secondary | ICD-10-CM | POA: Insufficient documentation

## 2023-07-15 DIAGNOSIS — M48061 Spinal stenosis, lumbar region without neurogenic claudication: Secondary | ICD-10-CM | POA: Insufficient documentation

## 2023-07-15 DIAGNOSIS — M5416 Radiculopathy, lumbar region: Secondary | ICD-10-CM | POA: Insufficient documentation

## 2023-07-15 DIAGNOSIS — I10 Essential (primary) hypertension: Secondary | ICD-10-CM | POA: Insufficient documentation

## 2023-07-15 DIAGNOSIS — Z8673 Personal history of transient ischemic attack (TIA), and cerebral infarction without residual deficits: Secondary | ICD-10-CM | POA: Diagnosis not present

## 2023-07-15 DIAGNOSIS — M4800 Spinal stenosis, site unspecified: Principal | ICD-10-CM | POA: Diagnosis present

## 2023-07-15 DIAGNOSIS — F17210 Nicotine dependence, cigarettes, uncomplicated: Secondary | ICD-10-CM | POA: Insufficient documentation

## 2023-07-15 HISTORY — PX: TRANSFORAMINAL LUMBAR INTERBODY FUSION (TLIF) WITH PEDICLE SCREW FIXATION 1 LEVEL: SHX6141

## 2023-07-15 SURGERY — TRANSFORAMINAL LUMBAR INTERBODY FUSION (TLIF) WITH PEDICLE SCREW FIXATION 1 LEVEL
Anesthesia: General | Site: Spine Lumbar | Laterality: Right

## 2023-07-15 MED ORDER — CHLORHEXIDINE GLUCONATE 0.12 % MT SOLN: 15.0000 mL | Freq: Once | OROMUCOSAL | Status: AC

## 2023-07-15 MED ORDER — DEXMEDETOMIDINE HCL IN NACL 80 MCG/20ML IV SOLN
INTRAVENOUS | Status: DC | PRN
  Administered 2023-07-15: 12 ug via INTRAVENOUS

## 2023-07-15 MED ORDER — KETAMINE HCL 50 MG/5ML IJ SOSY
PREFILLED_SYRINGE | INTRAMUSCULAR | Status: AC
  Filled 2023-07-15: qty 5

## 2023-07-15 MED ORDER — SODIUM CHLORIDE 0.9% FLUSH
10.0000 mL | Freq: Two times a day (BID) | INTRAVENOUS | Status: DC
  Administered 2023-07-15: 10 mL via INTRAVENOUS

## 2023-07-15 MED ORDER — HYDROMORPHONE HCL 1 MG/ML IJ SOLN
INTRAMUSCULAR | Status: DC | PRN
  Administered 2023-07-15 (×2): .5 mg via INTRAVENOUS

## 2023-07-15 MED ORDER — MENTHOL 3 MG MT LOZG: 1.0000 | LOZENGE | OROMUCOSAL | Status: DC | PRN

## 2023-07-15 MED ORDER — HYDROMORPHONE HCL 1 MG/ML IJ SOLN
INTRAMUSCULAR | Status: AC
  Filled 2023-07-15: qty 1

## 2023-07-15 MED ORDER — SODIUM CHLORIDE 0.9 % IV SOLN: INTRAVENOUS | Status: DC | PRN

## 2023-07-15 MED ORDER — FENTANYL CITRATE (PF) 250 MCG/5ML IJ SOLN
INTRAMUSCULAR | Status: AC
  Filled 2023-07-15: qty 5

## 2023-07-15 MED ORDER — ORAL CARE MOUTH RINSE: 15.0000 mL | Freq: Once | OROMUCOSAL | Status: AC

## 2023-07-15 MED ORDER — KETAMINE HCL 10 MG/ML IJ SOLN
INTRAMUSCULAR | Status: DC | PRN
  Administered 2023-07-15: 10 mg via INTRAVENOUS
  Administered 2023-07-15: 20 mg via INTRAVENOUS
  Administered 2023-07-15: 10 mg via INTRAVENOUS

## 2023-07-15 MED ORDER — POVIDONE-IODINE 7.5 % EX SOLN: Freq: Once | CUTANEOUS | Status: DC

## 2023-07-15 MED ORDER — OXYCODONE-ACETAMINOPHEN 5-325 MG PO TABS
1.0000 | ORAL_TABLET | ORAL | Status: DC | PRN
  Administered 2023-07-15 (×3): 1 via ORAL
  Administered 2023-07-15 – 2023-07-16 (×3): 2 via ORAL
  Filled 2023-07-15 (×3): qty 2
  Filled 2023-07-15: qty 1
  Filled 2023-07-15: qty 2
  Filled 2023-07-15: qty 1

## 2023-07-15 MED ORDER — ZOLPIDEM TARTRATE 5 MG PO TABS: 5.0000 mg | ORAL_TABLET | Freq: Every evening | ORAL | Status: DC | PRN

## 2023-07-15 MED ORDER — THROMBIN 20000 UNITS EX SOLR
CUTANEOUS | Status: AC
  Filled 2023-07-15: qty 20000

## 2023-07-15 MED ORDER — HYDROMORPHONE HCL 1 MG/ML IJ SOLN: 0.2500 mg | INTRAMUSCULAR | Status: DC | PRN

## 2023-07-15 MED ORDER — PHENYLEPHRINE 80 MCG/ML (10ML) SYRINGE FOR IV PUSH (FOR BLOOD PRESSURE SUPPORT)
PREFILLED_SYRINGE | INTRAVENOUS | Status: DC | PRN
  Administered 2023-07-15: 160 ug via INTRAVENOUS

## 2023-07-15 MED ORDER — METHOCARBAMOL 500 MG PO TABS
500.0000 mg | ORAL_TABLET | Freq: Four times a day (QID) | ORAL | Status: DC | PRN
  Administered 2023-07-15 – 2023-07-16 (×2): 500 mg via ORAL
  Filled 2023-07-15 (×2): qty 1

## 2023-07-15 MED ORDER — OXYCODONE HCL 5 MG/5ML PO SOLN: 5.0000 mg | Freq: Once | ORAL | Status: DC | PRN

## 2023-07-15 MED ORDER — FENTANYL CITRATE (PF) 250 MCG/5ML IJ SOLN
INTRAMUSCULAR | Status: DC | PRN
  Administered 2023-07-15: 100 ug via INTRAVENOUS
  Administered 2023-07-15 (×2): 50 ug via INTRAVENOUS
  Administered 2023-07-15 (×2): 25 ug via INTRAVENOUS

## 2023-07-15 MED ORDER — HYDROMORPHONE HCL 1 MG/ML IJ SOLN
INTRAMUSCULAR | Status: AC
  Filled 2023-07-15: qty 0.5

## 2023-07-15 MED ORDER — BUPIVACAINE-EPINEPHRINE 0.25% -1:200000 IJ SOLN
INTRAMUSCULAR | Status: DC | PRN
  Administered 2023-07-15: 8 mL

## 2023-07-15 MED ORDER — FENOFIBRATE 54 MG PO TABS
54.0000 mg | ORAL_TABLET | Freq: Every day | ORAL | Status: DC
  Administered 2023-07-15: 54 mg via ORAL
  Filled 2023-07-15 (×2): qty 1

## 2023-07-15 MED ORDER — PANTOPRAZOLE SODIUM 40 MG PO TBEC
40.0000 mg | DELAYED_RELEASE_TABLET | Freq: Every day | ORAL | Status: DC
  Administered 2023-07-16: 40 mg via ORAL
  Filled 2023-07-15: qty 1

## 2023-07-15 MED ORDER — AMISULPRIDE (ANTIEMETIC) 5 MG/2ML IV SOLN: 10.0000 mg | Freq: Once | INTRAVENOUS | Status: DC | PRN

## 2023-07-15 MED ORDER — LOSARTAN POTASSIUM 50 MG PO TABS
100.0000 mg | ORAL_TABLET | Freq: Every day | ORAL | Status: DC
  Filled 2023-07-15: qty 2

## 2023-07-15 MED ORDER — BUPIVACAINE LIPOSOME 1.3 % IJ SUSP
INTRAMUSCULAR | Status: AC
  Filled 2023-07-15: qty 20

## 2023-07-15 MED ORDER — SUGAMMADEX SODIUM 200 MG/2ML IV SOLN
INTRAVENOUS | Status: DC | PRN
  Administered 2023-07-15: 50 mg via INTRAVENOUS
  Administered 2023-07-15: 100 mg via INTRAVENOUS
  Administered 2023-07-15 (×3): 50 mg via INTRAVENOUS

## 2023-07-15 MED ORDER — CEFAZOLIN SODIUM-DEXTROSE 2-4 GM/100ML-% IV SOLN
2.0000 g | INTRAVENOUS | Status: AC
Start: 2023-07-15 — End: 2023-07-15
  Administered 2023-07-15: 2 g via INTRAVENOUS
  Filled 2023-07-15: qty 100

## 2023-07-15 MED ORDER — SODIUM CHLORIDE 0.9% FLUSH: 3.0000 mL | INTRAVENOUS | Status: DC | PRN

## 2023-07-15 MED ORDER — SODIUM CHLORIDE 0.9 % IV SOLN
250.0000 mL | INTRAVENOUS | Status: DC
  Administered 2023-07-15: 250 mL via INTRAVENOUS

## 2023-07-15 MED ORDER — LIDOCAINE 2% (20 MG/ML) 5 ML SYRINGE: INTRAMUSCULAR | Status: DC | PRN

## 2023-07-15 MED ORDER — 0.9 % SODIUM CHLORIDE (POUR BTL) OPTIME
TOPICAL | Status: DC | PRN
  Administered 2023-07-15 (×2): 1000 mL

## 2023-07-15 MED ORDER — ONDANSETRON HCL 4 MG/2ML IJ SOLN
INTRAMUSCULAR | Status: AC
  Filled 2023-07-15: qty 2

## 2023-07-15 MED ORDER — PHENYLEPHRINE HCL-NACL 20-0.9 MG/250ML-% IV SOLN
INTRAVENOUS | Status: DC | PRN
  Administered 2023-07-15: 35 ug/min via INTRAVENOUS

## 2023-07-15 MED ORDER — ACETAMINOPHEN 10 MG/ML IV SOLN
INTRAVENOUS | Status: AC
  Filled 2023-07-15: qty 100

## 2023-07-15 MED ORDER — CHLORHEXIDINE GLUCONATE 0.12 % MT SOLN: 15.0000 mL | Freq: Once | OROMUCOSAL | Status: DC

## 2023-07-15 MED ORDER — ACETAMINOPHEN 325 MG PO TABS
650.0000 mg | ORAL_TABLET | Freq: Once | ORAL | Status: AC
Start: 2023-07-15 — End: 2023-07-15
  Administered 2023-07-15: 650 mg via ORAL
  Filled 2023-07-15: qty 2

## 2023-07-15 MED ORDER — ESCITALOPRAM OXALATE 20 MG PO TABS
20.0000 mg | ORAL_TABLET | Freq: Every day | ORAL | Status: DC
  Administered 2023-07-16: 20 mg via ORAL
  Filled 2023-07-15: qty 1

## 2023-07-15 MED ORDER — METHOCARBAMOL 1000 MG/10ML IJ SOLN: 500.0000 mg | Freq: Four times a day (QID) | INTRAMUSCULAR | Status: DC | PRN

## 2023-07-15 MED ORDER — ALBUMIN HUMAN 5 % IV SOLN: INTRAVENOUS | Status: DC | PRN

## 2023-07-15 MED ORDER — PROPOFOL 10 MG/ML IV BOLUS
INTRAVENOUS | Status: DC | PRN
  Administered 2023-07-15: 140 mg via INTRAVENOUS
  Administered 2023-07-15: 20 mg via INTRAVENOUS

## 2023-07-15 MED ORDER — ORAL CARE MOUTH RINSE: 15.0000 mL | Freq: Once | OROMUCOSAL | Status: DC

## 2023-07-15 MED ORDER — CEFAZOLIN SODIUM-DEXTROSE 1-4 GM/50ML-% IV SOLN
1.0000 g | Freq: Three times a day (TID) | INTRAVENOUS | Status: AC
  Administered 2023-07-15 (×2): 1 g via INTRAVENOUS
  Filled 2023-07-15 (×2): qty 50

## 2023-07-15 MED ORDER — ONDANSETRON HCL 4 MG/2ML IJ SOLN
INTRAMUSCULAR | Status: DC | PRN
  Administered 2023-07-15 (×2): 4 mg via INTRAVENOUS

## 2023-07-15 MED ORDER — CHLORHEXIDINE GLUCONATE 0.12 % MT SOLN
OROMUCOSAL | Status: AC
  Administered 2023-07-15: 15 mL via OROMUCOSAL
  Filled 2023-07-15: qty 15

## 2023-07-15 MED ORDER — ONDANSETRON HCL 4 MG/2ML IJ SOLN: 4.0000 mg | Freq: Once | INTRAMUSCULAR | Status: DC | PRN

## 2023-07-15 MED ORDER — ACETAMINOPHEN 650 MG RE SUPP: 650.0000 mg | RECTAL | Status: DC | PRN

## 2023-07-15 MED ORDER — LIDOCAINE 2% (20 MG/ML) 5 ML SYRINGE
INTRAMUSCULAR | Status: AC
  Filled 2023-07-15: qty 5

## 2023-07-15 MED ORDER — LIDOCAINE 2% (20 MG/ML) 5 ML SYRINGE
INTRAMUSCULAR | Status: DC | PRN
  Administered 2023-07-15: 20 mg via INTRAVENOUS
  Administered 2023-07-15: 40 mg via INTRAVENOUS
  Administered 2023-07-15: 60 mg via INTRAVENOUS

## 2023-07-15 MED ORDER — EPHEDRINE SULFATE-NACL 50-0.9 MG/10ML-% IV SOSY
PREFILLED_SYRINGE | INTRAVENOUS | Status: DC | PRN
  Administered 2023-07-15 (×2): 10 mg via INTRAVENOUS

## 2023-07-15 MED ORDER — LACTATED RINGERS IV SOLN: INTRAVENOUS | Status: DC

## 2023-07-15 MED ORDER — ROSUVASTATIN CALCIUM 5 MG PO TABS
5.0000 mg | ORAL_TABLET | Freq: Every day | ORAL | Status: DC
  Administered 2023-07-15: 5 mg via ORAL
  Filled 2023-07-15: qty 1

## 2023-07-15 MED ORDER — OXYCODONE HCL 5 MG PO TABS: 5.0000 mg | ORAL_TABLET | Freq: Once | ORAL | Status: DC | PRN

## 2023-07-15 MED ORDER — BUPIVACAINE-EPINEPHRINE (PF) 0.25% -1:200000 IJ SOLN
INTRAMUSCULAR | Status: AC
  Filled 2023-07-15: qty 30

## 2023-07-15 MED ORDER — BUPIVACAINE-EPINEPHRINE (PF) 0.25% -1:200000 IJ SOLN
INTRAMUSCULAR | Status: DC | PRN
  Administered 2023-07-15: 40 mL

## 2023-07-15 MED ORDER — ALUM & MAG HYDROXIDE-SIMETH 200-200-20 MG/5ML PO SUSP: 30.0000 mL | Freq: Four times a day (QID) | ORAL | Status: DC | PRN

## 2023-07-15 MED ORDER — PHENOL 1.4 % MT LIQD: 1.0000 | OROMUCOSAL | Status: DC | PRN

## 2023-07-15 MED ORDER — MIDAZOLAM HCL 2 MG/2ML IJ SOLN
INTRAMUSCULAR | Status: DC | PRN
  Administered 2023-07-15: 2 mg via INTRAVENOUS

## 2023-07-15 MED ORDER — SCOPOLAMINE 1 MG/3DAYS TD PT72
MEDICATED_PATCH | TRANSDERMAL | Status: DC | PRN
  Administered 2023-07-15: 1 via TRANSDERMAL

## 2023-07-15 MED ORDER — ONDANSETRON HCL 4 MG/2ML IJ SOLN: 4.0000 mg | Freq: Four times a day (QID) | INTRAMUSCULAR | Status: DC | PRN

## 2023-07-15 MED ORDER — ROCURONIUM BROMIDE 10 MG/ML (PF) SYRINGE
PREFILLED_SYRINGE | INTRAVENOUS | Status: DC | PRN
  Administered 2023-07-15: 30 mg via INTRAVENOUS
  Administered 2023-07-15: 10 mg via INTRAVENOUS
  Administered 2023-07-15: 70 mg via INTRAVENOUS

## 2023-07-15 MED ORDER — ACETAMINOPHEN 325 MG PO TABS: 650.0000 mg | ORAL_TABLET | ORAL | Status: DC | PRN

## 2023-07-15 MED ORDER — ALPRAZOLAM 0.5 MG PO TABS: 0.5000 mg | ORAL_TABLET | Freq: Three times a day (TID) | ORAL | Status: DC | PRN

## 2023-07-15 MED ORDER — THROMBIN 20000 UNITS EX KIT
PACK | CUTANEOUS | Status: DC | PRN
  Administered 2023-07-15: 20 mL via TOPICAL

## 2023-07-15 MED ORDER — ONDANSETRON HCL 4 MG PO TABS: 4.0000 mg | ORAL_TABLET | Freq: Four times a day (QID) | ORAL | Status: DC | PRN

## 2023-07-15 MED ORDER — MIDAZOLAM HCL 2 MG/2ML IJ SOLN
INTRAMUSCULAR | Status: AC
Start: 2023-07-15 — End: ?
  Filled 2023-07-15: qty 2

## 2023-07-15 MED ORDER — SODIUM CHLORIDE 0.9% FLUSH: 3.0000 mL | Freq: Two times a day (BID) | INTRAVENOUS | Status: DC

## 2023-07-15 SURGICAL SUPPLY — 82 items
BAG COUNTER SPONGE SURGICOUNT (BAG) ×2 IMPLANT
BENZOIN TINCTURE PRP APPL 2/3 (GAUZE/BANDAGES/DRESSINGS) ×2 IMPLANT
BLADE CLIPPER SURG (BLADE) IMPLANT
BUR PRESCISION 1.7 ELITE (BURR) ×2 IMPLANT
BUR ROUND FLUTED 5 RND (BURR) ×2 IMPLANT
BUR ROUND PRECISION 4.0 (BURR) IMPLANT
BUR SABER RD CUTTING 3.0 (BURR) IMPLANT
CAGE SABLE 10X22 6-12 0D (Cage) IMPLANT
CANNULA GRAFT BNE VG PRE-FILL (Bone Implant) IMPLANT
CNTNR URN SCR LID CUP LEK RST (MISCELLANEOUS) ×2 IMPLANT
COVER BACK TABLE 60X90IN (DRAPES) ×2 IMPLANT
COVER MAYO STAND STRL (DRAPES) ×4 IMPLANT
COVER SURGICAL LIGHT HANDLE (MISCELLANEOUS) ×2 IMPLANT
DISPENSER GRAFT BNE VG (MISCELLANEOUS) IMPLANT
DISPENSER VIVIGEN BONE GRAFT (MISCELLANEOUS) ×1
DRAIN CHANNEL 15F RND FF W/TCR (WOUND CARE) IMPLANT
DRAPE C-ARM 42X72 X-RAY (DRAPES) ×2 IMPLANT
DRAPE C-ARMOR (DRAPES) IMPLANT
DRAPE POUCH INSTRU U-SHP 10X18 (DRAPES) ×2 IMPLANT
DRAPE SURG 17X23 STRL (DRAPES) ×8 IMPLANT
DURAPREP 26ML APPLICATOR (WOUND CARE) ×2 IMPLANT
ELECT BLADE 4.0 EZ CLEAN MEGAD (MISCELLANEOUS) ×1
ELECT CAUTERY BLADE 6.4 (BLADE) ×2 IMPLANT
ELECT REM PT RETURN 9FT ADLT (ELECTROSURGICAL) ×1
ELECTRODE BLDE 4.0 EZ CLN MEGD (MISCELLANEOUS) ×2 IMPLANT
ELECTRODE REM PT RTRN 9FT ADLT (ELECTROSURGICAL) ×2 IMPLANT
EVACUATOR SILICONE 100CC (DRAIN) IMPLANT
FILTER STRAW FLUID ASPIR (MISCELLANEOUS) ×2 IMPLANT
GAUZE 4X4 16PLY ~~LOC~~+RFID DBL (SPONGE) ×2 IMPLANT
GAUZE SPONGE 4X4 12PLY STRL (GAUZE/BANDAGES/DRESSINGS) ×2 IMPLANT
GLOVE BIO SURGEON STRL SZ 6.5 (GLOVE) ×2 IMPLANT
GLOVE BIO SURGEON STRL SZ8 (GLOVE) ×2 IMPLANT
GLOVE BIOGEL PI IND STRL 7.0 (GLOVE) ×2 IMPLANT
GLOVE BIOGEL PI IND STRL 8 (GLOVE) ×2 IMPLANT
GLOVE SURG ENC MOIS LTX SZ6.5 (GLOVE) ×2 IMPLANT
GOWN STRL REUS W/ TWL LRG LVL3 (GOWN DISPOSABLE) ×4 IMPLANT
GOWN STRL REUS W/ TWL XL LVL3 (GOWN DISPOSABLE) ×2 IMPLANT
GRAFT BONE CANNULA VIVIGEN 3 (Bone Implant) ×2 IMPLANT
IV CATH 14GX2 1/4 (CATHETERS) ×2 IMPLANT
KIT BASIN OR (CUSTOM PROCEDURE TRAY) ×2 IMPLANT
KIT POSITION SURG JACKSON T1 (MISCELLANEOUS) ×2 IMPLANT
KIT TURNOVER KIT B (KITS) ×2 IMPLANT
MARKER SKIN DUAL TIP RULER LAB (MISCELLANEOUS) ×4 IMPLANT
NDL 18GX1X1/2 (RX/OR ONLY) (NEEDLE) ×2 IMPLANT
NDL 22X1.5 STRL (OR ONLY) (MISCELLANEOUS) ×4 IMPLANT
NDL HYPO 25GX1X1/2 BEV (NEEDLE) ×2 IMPLANT
NDL SPNL 18GX3.5 QUINCKE PK (NEEDLE) ×4 IMPLANT
NEEDLE 18GX1X1/2 (RX/OR ONLY) (NEEDLE) ×1
NEEDLE 22X1.5 STRL (OR ONLY) (MISCELLANEOUS) ×2
NEEDLE HYPO 25GX1X1/2 BEV (NEEDLE) ×1
NEEDLE SPNL 18GX3.5 QUINCKE PK (NEEDLE) ×2
NS IRRIG 1000ML POUR BTL (IV SOLUTION) ×2 IMPLANT
PACK LAMINECTOMY ORTHO (CUSTOM PROCEDURE TRAY) ×2 IMPLANT
PACK UNIVERSAL I (CUSTOM PROCEDURE TRAY) ×2 IMPLANT
PAD ARMBOARD 7.5X6 YLW CONV (MISCELLANEOUS) ×4 IMPLANT
PATTIES SURGICAL .5 X1 (DISPOSABLE) ×2 IMPLANT
PATTIES SURGICAL .5X1.5 (GAUZE/BANDAGES/DRESSINGS) ×2 IMPLANT
PUTTY DBX 2.5CC (Putty) ×1 IMPLANT
PUTTY DBX 2.5CC DEPUY (Putty) IMPLANT
ROD PRE BENT EXPEDIUM 35MM (Rod) IMPLANT
SCREW SET SINGLE INNER (Screw) IMPLANT
SCREW VIPER CORT FIX 6.00X30 (Screw) IMPLANT
SPONGE INTESTINAL PEANUT (DISPOSABLE) ×2 IMPLANT
SPONGE SURGIFOAM ABS GEL 100 (HEMOSTASIS) ×2 IMPLANT
STRIP CLOSURE SKIN 1/2X4 (GAUZE/BANDAGES/DRESSINGS) ×4 IMPLANT
SURGIFLO W/THROMBIN 8M KIT (HEMOSTASIS) IMPLANT
SUT MNCRL AB 4-0 PS2 18 (SUTURE) ×2 IMPLANT
SUT VIC AB 0 CT1 18XCR BRD 8 (SUTURE) ×2 IMPLANT
SUT VIC AB 1 CT1 18XCR BRD 8 (SUTURE) ×2 IMPLANT
SUT VIC AB 2-0 CT2 18 VCP726D (SUTURE) ×2 IMPLANT
SYR 20ML LL LF (SYRINGE) ×4 IMPLANT
SYR BULB IRRIG 60ML STRL (SYRINGE) ×2 IMPLANT
SYR CONTROL 10ML LL (SYRINGE) ×4 IMPLANT
SYR TB 1ML LUER SLIP (SYRINGE) ×2 IMPLANT
TAP EXPEDIUM DL 4.35 (INSTRUMENTS) IMPLANT
TAP EXPEDIUM DL 5.0 (INSTRUMENTS) IMPLANT
TAP EXPEDIUM DL 6.0 (INSTRUMENTS) IMPLANT
TAPE CLOTH SURG 4X10 WHT LF (GAUZE/BANDAGES/DRESSINGS) IMPLANT
TRAY FOLEY MTR SLVR 16FR STAT (SET/KITS/TRAYS/PACK) ×2 IMPLANT
TUBE FUNNEL GL DISP (ORTHOPEDIC DISPOSABLE SUPPLIES) IMPLANT
WATER STERILE IRR 1000ML POUR (IV SOLUTION) ×2 IMPLANT
YANKAUER SUCT BULB TIP NO VENT (SUCTIONS) ×2 IMPLANT

## 2023-07-15 NOTE — H&P (Signed)
PREOPERATIVE H&P  Chief Complaint: Right leg pain  HPI: Michelle Park is a 72 y.o. female who presents with ongoing pain in the right leg  MRI reveals recurrent stenosis and instability at L4/5   Patient has failed multiple forms of conservative care and continues to have pain (see office notes for additional details regarding the patient's full course of treatment)  Past Medical History:  Diagnosis Date   Allergic rhinitis due to pollen    Anxiety    Aortic stenosis    Bell's palsy    Bronchitis    hx of   Chronic back pain    COPD (chronic obstructive pulmonary disease) (HCC)    Depression    Dyspnea    with activity only   Episodic mood disorder (HCC)    Fibromyalgia    GERD (gastroesophageal reflux disease)    Headache    frequent   Heart murmur    Hyperlipidemia    Hypertension    Iron deficiency    Left carotid stenosis    PONV (postoperative nausea and vomiting)    Restless leg syndrome    Spinal stenosis    Stroke [facility])    Syncope    Past Surgical History:  Procedure Laterality Date   BACK SURGERY     2019 L spine Dr. 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   1 daughter   Social Determinants of Health   Financial Resource Strain: Patient Declined (12/22/2022)   Overall Financial Resource Strain (CARDIA)    Difficulty of Paying Living Expenses: Patient declined  Recent Concern: Financial Resource Strain - High Risk (09/23/2022)   Received from [facility]   Overall Financial Resource Strain (CARDIA)    Difficulty of Paying Living Expenses: Very hard  Food Insecurity: Patient Declined (12/22/2022)   Hunger Vital Sign    Worried About Running Out of Food in the Last Year: Patient declined    Ran Out of Food in the Last Year: Patient declined  Transportation Needs: No Transportation Needs (12/22/2022)   PRAPARE - Administrator, Civil Service (Medical): No    Lack of Transportation (Non-Medical): No  Physical Activity: Unknown (12/22/2022)   Exercise Vital Sign    Days of Exercise per Week: Patient declined    Minutes of Exercise per Session: Not on file  Stress: Stress Concern Present (12/22/2022)   [employer] of Occupational Health - Occupational Stress Questionnaire    Feeling of Stress : Rather much  Social Connections: Unknown (12/22/2022)   Social Connection and Isolation Panel [NHANES]    Frequency of Communication with Friends and Family: Patient declined    Frequency of Social Gatherings with Friends and Family: Once a week    Attends Religious Services: Patient declined    Database administrator or Organizations: No    Attends Engineer, structural: Not on file    Marital Status: Patient declined   Family History  Problem Relation Age of Onset   Heart disease Mother    Alzheimer's disease Mother    Cancer  Sister    Colon cancer Neg Hx    Esophageal cancer Neg Hx    Inflammatory bowel disease Neg Hx    Liver disease Neg Hx    Pancreatic cancer Neg Hx    Rectal cancer Neg Hx    Stomach cancer Neg Hx    Breast cancer Neg Hx    Allergies  Allergen Reactions   Boniva [Ibandronic Acid] Other (See Comments)    Arthralgias   Chantix [Varenicline] Other (See Comments)    GI intolerance   Lipitor [Atorvastatin] Other (See Comments)    Myalgias    Prior to Admission medications   Medication Sig Start Date End Date Taking? Authorizing Provider  ALPRAZolam Prudy Feeler) 0.5 MG tablet Take 1 tablet by mouth three times daily as needed 07/08/23  Yes Joaquim Nam, MD  aspirin EC 81 MG tablet Take 81 mg by mouth at bedtime.   Yes [provider]  clopidogrel (PLAVIX) 75 MG tablet Take 1 tablet by mouth once daily 06/01/23  Yes Joaquim Nam, MD  escitalopram (LEXAPRO) 20 MG tablet Take 1 tablet by mouth once daily 06/26/23  Yes Joaquim Nam, MD  fenofibrate 160 MG tablet TAKE 1 TABLET BY MOUTH ONCE DAILY AFTER SUPPER 11/25/22  Yes Joaquim Nam, MD  HYDROcodone-acetaminophen [facility]) 10-325 MG tablet Take 1 tablet by mouth every 6 (six) hours as needed for severe pain.   Yes [provider]  lansoprazole (PREVACID) 30 MG capsule Take 1 capsule (30 mg total) by mouth daily. 05/19/23  Yes Joaquim Nam, MD  losartan (COZAAR) 100 MG tablet Take 1 tablet by mouth once daily 06/10/23  Yes Joaquim Nam, MD  rosuvastatin (CRESTOR) 5 MG tablet TAKE 1 TABLET BY MOUTH AT BEDTIME 02/11/23  Yes Joaquim Nam, MD     All other systems have been reviewed and were otherwise negative with the exception of those mentioned in the HPI and as above.  Physical Exam: Vitals:   07/15/23 0704  BP: 125/69  Pulse: 73  Resp: 17  Temp: 98.2 F (36.8 C)  SpO2: 97%    Body mass index is 36.11 kg/m.  General: Alert, no acute distress Cardiovascular: No pedal edema Respiratory: No  cyanosis, no use of accessory musculature Skin: No lesions in the area of chief complaint Neurologic: Sensation intact distally Psychiatric: Patient is competent for consent with normal mood and affect Lymphatic: No axillary or cervical lymphadenopathy   Assessment/Plan: LUMBAR STENOSIS AND INSTABILITY, L4/5  Plan for Procedure(s): RIGHT-SIDED LUMBAR 4 - LUMBAR 5 TRANSFORAMINAL LUMBAR INTERBODY FUSION AND DECOMPRESSION  WITH INSTRUMENTATION AND ALLOGRAFT   Jackelyn Hoehn, MD 07/15/2023 8:20 AM

## 2023-07-15 NOTE — Op Note (Addendum)
 PATIENT NAME: Michelle Park   MEDICAL RECORD NO.:   [id number]   DATE OF BIRTH: 05-20-1951   DATE OF PROCEDURE: 07/15/2023                               OPERATIVE REPORT     PREOPERATIVE DIAGNOSES: 1. Right-sided lumbar radiculopathy 2. Recurrent L4-5 spinal stenosis 3. L4-5 spondylolisthesis 4. Status post previous L4-5 decompression 08/14/2017, by another provider   POSTOPERATIVE DIAGNOSES: 1. Right-sided lumbar radiculopathy 2. Recurrent L4-5 spinal stenosis 3. L4-5 spondylolisthesis 4. Status post previous L4-5 decompression 08/14/2017, by another provider   PROCEDURES: 1. Revision L4/5 decompression 2. Right-sided L4-5 transforaminal lumbar interbody fusion. 3. Left-sided L4-5 posterolateral fusion. 4. Insertion of interbody device x1 (Globus expandable intervertebral spacer). 5. Placement of segmental posterior instrumentation L4, L5 bilaterally  6. Use of local autograft. 7. Use of morselized allograft - Vivigen 8. Intraoperative use of fluoroscopy.   SURGEON:  Virl Grimes, MD.   ASSISTANT:  Gibson Kurtz, PA-C.   ANESTHESIA:  General endotracheal anesthesia.   COMPLICATIONS:  None.   DISPOSITION:  Stable.   ESTIMATED BLOOD LOSS:  100cc   INDICATIONS FOR SURGERY:  Briefly,  Michelle Park is a pleasant 72 year old female who did present to me with severe and ongoing pain in the right leg, which had been present for approximately 3 years. I did feel that the symptoms were secondary to the findings noted above. The patient failed conservative care and did wish to proceed with the procedure  noted above.   OPERATIVE DETAILS:  On 07/15/2023, the patient was brought to surgery and general endotracheal anesthesia was administered.  The patient was placed prone on a well-padded flat Jackson bed with a spinal frame.  Antibiotics were given and a time-out procedure was performed. The back was prepped and draped in the usual fashion.  A midline incision was made  overlying the L4-5 intervertebral spaces.  The fascia was incised at the midline.  The paraspinal musculature was bluntly swept laterally.  Anatomic landmarks for the pedicles were exposed. Using fluoroscopy, I did cannulate the L4 and L5 pedicles bilaterally, using a medial to lateral cortical trajectory technique.  At this point, 6 x 30 mm screws were placed into the left pedicles, and a 35 mm rod was placed into the tulip heads of the screw, and caps were also placed.  Distraction was then applied across the L4-5 intervertebral space, and the caps were then provisionally tightened.  On the right side, bone wax was placed into the cannulated pedicle holes.  I then proceeded with the decompressive aspect of the procedure at the L4-5 level.  A partial facetectomy was performed bilaterally at L4-5, decompressing the L4-5 intervertebral space.  I was very pleased with the decompression. With an assistant holding medial retraction of the traversing right L5 nerve, I did perform an annulotomy at the posterolateral aspect of the L4-5 intervertebral space.  I then used a series of curettes and pituitary rongeurs to perform a thorough and complete intervertebral diskectomy.  The intervertebral space was then liberally packed with autograft as well as allograft in the form of Vivigen, as was the appropriate-sized intervertebral spacer.  The spacer was then tamped into position in the usual fashion, and expanded to 9 mm in height. I was very pleased with the press-fit of the spacer.  I then placed 6 mm screws on the right at L4 and L5. A 35 mm rod  was then placed and caps were placed. The distraction was then released on the contralateral side.  All caps were then locked.  The wound was copiously irrigated with a total of approximately 3 L prior to placing the bone graft.   Next, I turned my attention toward the posterolateral fusion portion of the procedure (performed in addition to the interbody fusion  portion).  To accomplish the posterolateral fusion, I did use a high-speed bur to decorticate the left L4-5 facet joint, and the left L4-5 posterolateral gutter, including the left L4 and L5 transverse processes.  Allograft was then packed into the region of decortication, in the form of Vivigen.  In addition, autograft was placed in the region of the decorticated fusion bed.  The autograft was obtained from the decompressive aspect of the procedure outlined above.  In this fashion, I did accomplish both an interbody fusion, as well as a posterolateral fusion.  The wound was then explored for any undue bleeding and there was no substantial bleeding encountered.  Gel-Foam was placed over the laminectomy site.  The wound was then closed in layers using #1 Vicryl followed by 2-0 Vicryl, followed by 4-0 Monocryl.  Benzoin and Steri-Strips were applied followed by sterile dressing.     Of note, Gibson Kurtz, PA-C was my assistant throughout surgery, and did aid in retraction, suctioning, the decompression, placement of the hardware, and closure.     Virl Grimes, MD

## 2023-07-15 NOTE — Transfer of Care (Signed)
Immediate Anesthesia Transfer of Care Note  Patient: CASIA PLA  Procedure(s) Performed: RIGHT-SIDED LUMBAR 4 - LUMBAR 5 TRANSFORAMINAL LUMBAR INTERBODY FUSION AND DECOMPRESSION WITH INSTRUMENTATION AND ALLOGRAFT (Right: Spine Lumbar)  Patient Location: PACU  Anesthesia Type:General  Level of Consciousness: awake and drowsy  Airway & Oxygen Therapy: Patient Spontanous Breathing and Patient connected to face mask oxygen  Post-op Assessment: Report given to RN and Post -op Vital signs reviewed and stable  Post vital signs: Reviewed and stable  Last Vitals:  Vitals Value Taken Time  BP 135/77 07/15/23 1235  Temp    Pulse 84 07/15/23 1241  Resp 17 07/15/23 1241  SpO2 88 % 07/15/23 1241  Vitals shown include unfiled device data.  Last Pain:  Vitals:   07/15/23 0748  TempSrc:   PainSc: 8       Patients Stated Pain Goal: 3 (07/15/23 0748)  Complications: No notable events documented.

## 2023-07-15 NOTE — Anesthesia Postprocedure Evaluation (Signed)
Anesthesia Post Note  Patient: Michelle Park  Procedure(s) Performed: RIGHT-SIDED LUMBAR 4 - LUMBAR 5 TRANSFORAMINAL LUMBAR INTERBODY FUSION AND DECOMPRESSION WITH INSTRUMENTATION AND ALLOGRAFT (Right: Spine Lumbar)     Patient location during evaluation: PACU Anesthesia Type: General Level of consciousness: awake and alert and oriented Pain management: pain level controlled Vital Signs Assessment: post-procedure vital signs reviewed and stable Respiratory status: spontaneous breathing, nonlabored ventilation and respiratory function stable Cardiovascular status: blood pressure returned to baseline and stable Postop Assessment: no apparent nausea or vomiting Anesthetic complications: no   No notable events documented.  Last Vitals:  Vitals:   07/15/23 1300 07/15/23 1315  BP: (!) 98/57 (!) 97/53  Pulse: 84 79  Resp: 15 17  Temp:    SpO2: 97% 96%    Last Pain:  Vitals:   07/15/23 1300  TempSrc:   PainSc: 0-No pain                 Jamai Dolce A.

## 2023-07-15 NOTE — Anesthesia Procedure Notes (Signed)
Procedure Name: Intubation Date/Time: 07/15/2023 8:37 AM  Performed by: Stanton Kidney, CRNAPre-anesthesia Checklist: Patient identified, Patient being monitored, Timeout performed, Emergency Drugs available and Suction available Patient Re-evaluated:Patient Re-evaluated prior to induction Oxygen Delivery Method: Circle system utilized Preoxygenation: Pre-oxygenation with 100% oxygen Induction Type: IV induction Ventilation: Mask ventilation without difficulty Laryngoscope Size: 3 and Miller Grade View: Grade I Tube type: Oral Tube size: 7.0 mm Number of attempts: 1 Airway Equipment and Method: Stylet Placement Confirmation: ETT inserted through vocal cords under direct vision, positive ETCO2 and breath sounds checked- equal and bilateral Secured at: 21 cm Tube secured with: Tape Dental Injury: Teeth and Oropharynx as per pre-operative assessment  Comments: Atraumatic dentition unchanged

## 2023-07-16 DIAGNOSIS — M4316 Spondylolisthesis, lumbar region: Secondary | ICD-10-CM | POA: Diagnosis not present

## 2023-07-16 MED ORDER — OXYCODONE-ACETAMINOPHEN 5-325 MG PO TABS: 1.0000 | ORAL_TABLET | ORAL | 0 refills | Status: DC | PRN

## 2023-07-16 MED ORDER — METHOCARBAMOL 500 MG PO TABS: 500.0000 mg | ORAL_TABLET | Freq: Four times a day (QID) | ORAL | 2 refills | Status: DC | PRN

## 2023-07-16 MED FILL — Thrombin For Soln 20000 Unit: CUTANEOUS | Qty: 1 | Status: AC

## 2023-07-16 NOTE — Progress Notes (Signed)
Patient alert and oriented, mae's well, voiding adequate amount of urine, swallowing without difficulty, no c/o pain at time of discharge. Patient discharged home with family. Script and discharged instructions given to patient. Patient and family stated understanding of instructions given. Patient has an appointment with Dr. Dumonski 

## 2023-07-16 NOTE — Plan of Care (Signed)
  Problem: Education: Goal: Knowledge of General Education information will improve Description: Including pain rating scale, medication(s)/side effects and non-pharmacologic comfort measures Outcome: Completed/Met   Problem: Health Behavior/Discharge Planning: Goal: Ability to manage health-related needs will improve Outcome: Completed/Met   Problem: Clinical Measurements: Goal: Ability to maintain clinical measurements within normal limits will improve Outcome: Completed/Met Goal: Will remain free from infection Outcome: Completed/Met Goal: Diagnostic test results will improve Outcome: Completed/Met Goal: Respiratory complications will improve Outcome: Completed/Met Goal: Cardiovascular complication will be avoided Outcome: Completed/Met   Problem: Activity: Goal: Risk for activity intolerance will decrease Outcome: Completed/Met   Problem: Nutrition: Goal: Adequate nutrition will be maintained Outcome: Completed/Met   Problem: Coping: Goal: Level of anxiety will decrease Outcome: Completed/Met   Problem: Elimination: Goal: Will not experience complications related to bowel motility Outcome: Completed/Met Goal: Will not experience complications related to urinary retention Outcome: Completed/Met   Problem: Pain Management: Goal: General experience of comfort will improve Outcome: Completed/Met   Problem: Safety: Goal: Ability to remain free from injury will improve Outcome: Completed/Met   Problem: Skin Integrity: Goal: Risk for impaired skin integrity will decrease Outcome: Completed/Met   Problem: Education: Goal: Ability to verbalize activity precautions or restrictions will improve Outcome: Completed/Met Goal: Knowledge of the prescribed therapeutic regimen will improve Outcome: Completed/Met Goal: Understanding of discharge needs will improve Outcome: Completed/Met   Problem: Activity: Goal: Ability to avoid complications of mobility impairment will  improve Outcome: Completed/Met Goal: Ability to tolerate increased activity will improve Outcome: Completed/Met Goal: Will remain free from falls Outcome: Completed/Met   Problem: Bowel/Gastric: Goal: Gastrointestinal status for postoperative course will improve Outcome: Completed/Met   Problem: Clinical Measurements: Goal: Ability to maintain clinical measurements within normal limits will improve Outcome: Completed/Met Goal: Postoperative complications will be avoided or minimized Outcome: Completed/Met Goal: Diagnostic test results will improve Outcome: Completed/Met   Problem: Pain Management: Goal: Pain level will decrease Outcome: Completed/Met   Problem: Skin Integrity: Goal: Will show signs of wound healing Outcome: Completed/Met

## 2023-07-16 NOTE — Care Management CC44 (Signed)
Condition Code 44 Documentation Completed  Patient Details  Name: AMILY CARRAGHER MRN: [id number] Date of Birth: [birth year]   Condition Code 44 given:  Yes Patient signature on Condition Code 44 notice:  Yes Documentation of 2 MD's agreement:  Yes Code 44 added to claim:  Yes    Kermit Balo, RN 07/16/2023, 8:25 AM

## 2023-07-16 NOTE — Evaluation (Signed)
Physical Therapy Evaluation  Patient Details Name: Michelle Park MRN: [id number] DOB: 11-02-1950 Today's Date: 07/16/2023  History of Present Illness  Michelle Park is a 72 yo female who underwent TLIF L4-5 12/11. PMHx: anxiety, bells palsy, COPD, depression, fibromyalgia, HLD, HTN, CVA  Clinical Impression  Pt admitted with above diagnosis. At the time of PT eval, pt was able to demonstrate transfers and ambulation with gross CGA to supervision for safety and RW for support. Pt was educated on precautions, brace application/wearing schedule, appropriate activity progression, and car transfer. Brace adjusted for optimal fit. Pt currently with functional limitations due to the deficits listed below (see PT Problem List). Pt will benefit from skilled PT to increase their independence and safety with mobility to allow discharge to the venue listed below.          If plan is discharge home, recommend the following: A little help with walking and/or transfers;A little help with bathing/dressing/bathroom;Assistance with cooking/housework;Assist for transportation;Help with stairs or ramp for entrance   Can travel by private vehicle        Equipment Recommendations None recommended by PT  Recommendations for Other Services       Functional Status Assessment Patient has had a recent decline in their functional status and demonstrates the ability to make significant improvements in function in a reasonable and predictable amount of time.     Precautions / Restrictions Precautions Precautions: Fall;Back Precaution Booklet Issued: Yes (comment) Required Braces or Orthoses: Spinal Brace Spinal Brace: Thoracolumbosacral orthotic;Applied in sitting position Restrictions Weight Bearing Restrictions Per Provider Order: No      Mobility  Bed Mobility               General bed mobility comments: Pt was received sitting up EOB.    Transfers Overall transfer level: Needs  assistance Equipment used: Rolling walker (2 wheels), None Transfers: Sit to/from Stand Sit to Stand: Supervision           General transfer comment: VC's for hand placement on seated surface for safety.    Ambulation/Gait Ambulation/Gait assistance: Contact guard assist Gait Distance (Feet): 300 Feet Assistive device: Rolling walker (2 wheels) Gait Pattern/deviations: Step-through pattern, Decreased stride length, Trunk flexed Gait velocity: Decreased Gait velocity interpretation: 1.31 - 2.62 ft/sec, indicative of limited community ambulator   General Gait Details: VC's for improved posture, closer walker proximity and forward gaze. No assist required and no overt LOB noted.  Stairs            Wheelchair Mobility     Tilt Bed    Modified Rankin (Stroke Patients Only)       Balance Overall balance assessment: Mild deficits observed, not formally tested                                           Pertinent Vitals/Pain Pain Assessment Pain Assessment: Faces Faces Pain Scale: Hurts little more Pain Location: back Pain Descriptors / Indicators: Discomfort Pain Intervention(s): Limited activity within patient's tolerance, Monitored during session, Repositioned    Home Living Family/patient expects to be discharged to:: Private residence Living Arrangements: Children;Other relatives Available Help at Discharge: Family;Available 24 hours/day Type of Home: House Home Access: Level entry       Home Layout: One level Home Equipment: Agricultural consultant (2 wheels);Cane - single point;Grab bars - tub/shower      Prior Function Prior  Level of Function : Independent/Modified Independent             Mobility Comments: no AD ADLs Comments: indep     Extremity/Trunk Assessment   Upper Extremity Assessment Upper Extremity Assessment: Defer to OT evaluation    Lower Extremity Assessment Lower Extremity Assessment: Generalized weakness (Mild;  consistent with pre-op diagnosis)    Cervical / Trunk Assessment Cervical / Trunk Assessment: Back Surgery  Communication   Communication Communication: No apparent difficulties  Cognition Arousal: Alert Behavior During Therapy: WFL for tasks assessed/performed Overall Cognitive Status: Within Functional Limits for tasks assessed                                          General Comments      Exercises     Assessment/Plan    PT Assessment Patient needs continued PT services  PT Problem List Decreased strength;Decreased activity tolerance;Decreased balance;Decreased mobility;Decreased knowledge of use of DME;Decreased safety awareness;Decreased knowledge of precautions;Pain       PT Treatment Interventions DME instruction;Gait training;Stair training;Functional mobility training;Therapeutic activities;Therapeutic exercise;Balance training;Patient/family education    PT Goals (Current goals can be found in the Care Plan section)  Acute Rehab PT Goals Patient Stated Goal: Home today PT Goal Formulation: With patient/family Time For Goal Achievement: 07/23/23 Potential to Achieve Goals: Good    Frequency Min 5X/week     Co-evaluation               AM-PAC PT "6 Clicks" Mobility  Outcome Measure Help needed turning from your back to your side while in a flat bed without using bedrails?: A Little Help needed moving from lying on your back to sitting on the side of a flat bed without using bedrails?: A Little Help needed moving to and from a bed to a chair (including a wheelchair)?: A Little Help needed standing up from a chair using your arms (e.g., wheelchair or bedside chair)?: A Little Help needed to walk in hospital room?: A Little Help needed climbing 3-5 steps with a railing? : A Little 6 Click Score: 18    End of Session Equipment Utilized During Treatment: Gait belt;Back brace Activity Tolerance: Patient tolerated treatment well Patient  left: in bed;with call bell/phone within reach;with family/visitor present Nurse Communication: Mobility status PT Visit Diagnosis: Unsteadiness on feet (R26.81)    Time: 6578-4696 PT Time Calculation (min) (ACUTE ONLY): 23 min   Charges:   PT Evaluation $PT Eval Low Complexity: 1 Low PT Treatments $Gait Training: 8-22 mins PT General Charges $$ ACUTE PT VISIT: 1 Visit         Conni Slipper, PT, DPT Acute Rehabilitation Services Secure Chat Preferred Office: [phone]   Marylynn Pearson 07/16/2023, 2:47 PM

## 2023-07-16 NOTE — Evaluation (Signed)
Occupational Therapy Evaluation Patient Details Name: ZENOVIA PIGGOTT MRN: [id number] DOB: 08/16/1950 Today's Date: 07/16/2023   History of Present Illness TECORA ODOM is a 72 yo female who underwent TLIF L4-5 12/11. PMHx: anxiety, bells palsy, COPD, depression, fibromyalgia, HLD, HTN, CVA   Clinical Impression   Eunice Blase was evaluated s/p the above spine surgery. She is indep at baseline. Upon evaluation pt was limited by surgical pain, spinal precautions and decreased activity tolerance. Overall she was able to complete ADLs and mobility with mod I. Provided cues and education on spinal precautions and compensatory techniques throughout, handout provided and pt demonstrated great recall during ADLs and mobility. Pt does not require further acute OT services. Recommend d/c home with support of family.         If plan is discharge home, recommend the following: Assistance with cooking/housework;Assist for transportation    Functional Status Assessment  Patient has had a recent decline in their functional status and demonstrates the ability to make significant improvements in function in a reasonable and predictable amount of time.  Equipment Recommendations  None recommended by OT       Precautions / Restrictions Precautions Precautions: Fall;Back Precaution Booklet Issued: Yes (comment) Required Braces or Orthoses: Spinal Brace Spinal Brace: Thoracolumbosacral orthotic;Applied in sitting position Restrictions Weight Bearing Restrictions Per Provider Order: No      Mobility Bed Mobility Overal bed mobility: Needs Assistance Bed Mobility: Rolling, Sidelying to Sit Rolling: Supervision Sidelying to sit: Supervision       General bed mobility comments: log roll    Transfers Overall transfer level: Needs assistance Equipment used: Rolling walker (2 wheels), None Transfers: Sit to/from Stand Sit to Stand: Supervision           General transfer comment: with and  without AD      Balance Overall balance assessment: Mild deficits observed, not formally tested                   ADL either performed or assessed with clinical judgement   ADL Overall ADL's : Modified independent         General ADL Comments: After review of back precautions and compensatory techniques pt demonstrated mod I ability to complete ADLs without AD     Vision Baseline Vision/History: 0 No visual deficits Vision Assessment?: No apparent visual deficits     Perception Perception: Within Functional Limits       Praxis Praxis: WFL       Pertinent Vitals/Pain Pain Assessment Pain Assessment: Faces Faces Pain Scale: Hurts little more Pain Location: back Pain Descriptors / Indicators: Discomfort Pain Intervention(s): Limited activity within patient's tolerance, Monitored during session     Extremity/Trunk Assessment Upper Extremity Assessment Upper Extremity Assessment: Overall WFL for tasks assessed   Lower Extremity Assessment Lower Extremity Assessment: Defer to PT evaluation   Cervical / Trunk Assessment Cervical / Trunk Assessment: Back Surgery   Communication Communication Communication: No apparent difficulties   Cognition Arousal: Alert Behavior During Therapy: WFL for tasks assessed/performed Overall Cognitive Status: Within Functional Limits for tasks assessed               General Comments  VSS     Home Living Family/patient expects to be discharged to:: Private residence Living Arrangements: Children;Other relatives Available Help at Discharge: Family;Available 24 hours/day Type of Home: House Home Access: Level entry     Home Layout: One level     Bathroom Shower/Tub: Chief Strategy Officer: Standard  Home Equipment: Agricultural consultant (2 wheels);Cane - single point;Grab bars - tub/shower          Prior Functioning/Environment Prior Level of Function : Independent/Modified Independent              Mobility Comments: no AD ADLs Comments: indep        OT Problem List: Decreased activity tolerance;Decreased knowledge of precautions         OT Goals(Current goals can be found in the care plan section) Acute Rehab OT Goals Patient Stated Goal: home soon OT Goal Formulation: With patient Time For Goal Achievement: 07/16/23 Potential to Achieve Goals: Good   AM-PAC OT "6 Clicks" Daily Activity     Outcome Measure Help from another person eating meals?: None Help from another person taking care of personal grooming?: None Help from another person toileting, which includes using toliet, bedpan, or urinal?: None Help from another person bathing (including washing, rinsing, drying)?: None Help from another person to put on and taking off regular upper body clothing?: None Help from another person to put on and taking off regular lower body clothing?: None 6 Click Score: 24   End of Session Equipment Utilized During Treatment: Rolling walker (2 wheels) Nurse Communication: Mobility status  Activity Tolerance: Patient tolerated treatment well Patient left: in bed;with call bell/phone within reach  OT Visit Diagnosis: Other abnormalities of gait and mobility (R26.89);Pain                Time: 1660-6301 OT Time Calculation (min): 19 min Charges:  OT General Charges $OT Visit: 1 Visit OT Evaluation $OT Eval Moderate Complexity: 1 Mod  Derenda Mis, OTR/L Acute Rehabilitation Services Office [phone] Secure Chat Communication Preferred   Donia Pounds 07/16/2023, 9:22 AM

## 2023-07-16 NOTE — Progress Notes (Signed)
    Patient doing well  Patient denies leg pain Has been ambulating   Physical Exam: Vitals:   07/15/23 2307 07/16/23 0336  BP: (!) 112/57 118/61  Pulse: 89 81  Resp: 18 18  Temp: 98 F (36.7 C) 98 F (36.7 C)  SpO2: 98% 99%    Dressing in place NVI  POD #1 s/p L4/5 decompression and fusion, doing well  - up with PT/OT, encourage ambulation - Percocet for pain, Robaxin for muscle spasms - d/c home today with f/u in 2 weeks

## 2023-07-16 NOTE — Care Management Obs Status (Signed)
MEDICARE OBSERVATION STATUS NOTIFICATION   Patient Details  Name: Michelle Park MRN: [id number] Date of Birth: [birth year]   Medicare Observation Status Notification Given:  Yes    Kermit Balo, RN 07/16/2023, 8:25 AM

## 2023-07-17 ENCOUNTER — Telehealth: Payer: Self-pay | Admitting: Family Medicine

## 2023-07-17 NOTE — Telephone Encounter (Signed)
Pt called in and stated that Dr. Yevette Edwards told her to come off the Clopidogrel (  platelets )  and pt want Dr Armanda Heritage to let her know if she should or not come off it

## 2023-07-17 NOTE — Telephone Encounter (Signed)
My understanding is that patient was going to hold aspirin and plavix for 5 days prior to surgery and then restart afterward, assuming she wasn't having bleeding.  Thanks.

## 2023-07-19 ENCOUNTER — Encounter (HOSPITAL_COMMUNITY): Payer: Self-pay | Admitting: Orthopedic Surgery

## 2023-07-20 ENCOUNTER — Encounter: Payer: Medicare Other | Admitting: Family Medicine

## 2023-07-20 NOTE — Telephone Encounter (Signed)
Returned call to patient, patient wanted to know when she can restart the aspirin and the plavix. Per Dr. Para March that is a question she needs to ask the surgical team/ Dr. Yevette Edwards.

## 2023-07-20 NOTE — Telephone Encounter (Signed)
Left message to return call to our office.  

## 2023-07-20 NOTE — Telephone Encounter (Signed)
Patient returned call to [facility] like a return call .

## 2023-07-27 ENCOUNTER — Other Ambulatory Visit: Payer: Self-pay | Admitting: Family Medicine

## 2023-09-03 ENCOUNTER — Other Ambulatory Visit: Payer: Self-pay | Admitting: Family Medicine

## 2023-09-07 ENCOUNTER — Other Ambulatory Visit: Payer: Self-pay | Admitting: Family Medicine

## 2023-09-07 ENCOUNTER — Encounter: Payer: Self-pay | Admitting: Family Medicine

## 2023-09-07 ENCOUNTER — Telehealth: Payer: Self-pay

## 2023-09-07 NOTE — Telephone Encounter (Signed)
Unable to reach patient. Unable to leave voicemail, mailbox full.  

## 2023-09-07 NOTE — Telephone Encounter (Signed)
Sending to [employer].

## 2023-09-07 NOTE — Telephone Encounter (Signed)
Copied from CRM [id number]. Topic: Clinical - Medication Refill >> Sep 07, 2023  1:33 PM Pascal Lux wrote: Most Recent Primary Care Visit:  Provider: Joaquim Nam  Department: [facility]  Visit Type: OFFICE VISIT  Date: 05/14/2023  Medication: ALPRAZolam (XANAX) 0.5 MG tablet [[id number]]  Has the patient contacted their pharmacy? Yes (Agent: If no, request that the patient contact the pharmacy for the refill. If patient does not wish to contact the pharmacy document the reason why and proceed with request.) (Agent: If yes, when and what did the pharmacy advise?) -Told to contact provider.  Is this the correct pharmacy for this prescription? Yes If no, delete pharmacy and type the correct one.  This is the patient's preferred pharmacy:  [facility] [address] [place], [place] - [address] [place] [address] Phone: [phone] Fax: [phone]   Has the prescription been filled recently? Yes  Is the patient out of the medication? Yes  Has the patient been seen for an appointment in the last year OR does the patient have an upcoming appointment? Yes  Can we respond through MyChart? Yes  Agent: Please be advised that Rx refills may take up to 3 business days. We ask that you follow-up with your pharmacy.

## 2023-09-07 NOTE — Telephone Encounter (Signed)
 Marland Kitchen

## 2023-09-08 ENCOUNTER — Other Ambulatory Visit: Payer: Self-pay | Admitting: Family Medicine

## 2023-09-08 NOTE — Telephone Encounter (Signed)
Oxycodone rx declined. She needs to contact the managing MD for this rx.

## 2023-09-08 NOTE — Telephone Encounter (Signed)
Last Fill: 07/16/23  Last OV: 05/14/23 Next OV: 09/25/23  Routing to provider for review/authorization.

## 2023-09-09 NOTE — Telephone Encounter (Signed)
 Patient has been notified

## 2023-09-15 ENCOUNTER — Other Ambulatory Visit: Payer: Self-pay | Admitting: Family Medicine

## 2023-09-15 NOTE — Telephone Encounter (Signed)
Last office visit: 05/14/23 Next office visit: 10/16/23 Last refill: Clopidogrel Bisulfate 75 MG Oral Tablet this medication says it was discontinued 07/16/23

## 2023-09-16 NOTE — Telephone Encounter (Signed)
Sent. My understanding is that patient was going to hold aspirin and plavix for 5 days prior to surgery and then restart afterward, assuming she wasn't having bleeding.

## 2023-09-18 NOTE — Telephone Encounter (Signed)
Left voicemail for patient to return call to office.

## 2023-09-21 ENCOUNTER — Other Ambulatory Visit: Payer: Self-pay | Admitting: Family Medicine

## 2023-09-21 NOTE — Telephone Encounter (Signed)
 Left voicemail for patient to return call to office.

## 2023-09-22 ENCOUNTER — Other Ambulatory Visit: Payer: Self-pay | Admitting: Family Medicine

## 2023-09-25 ENCOUNTER — Ambulatory Visit: Payer: Medicare Other

## 2023-09-25 VITALS — Ht 65.0 in | Wt 217.0 lb

## 2023-09-25 DIAGNOSIS — Z0000 Encounter for general adult medical examination without abnormal findings: Secondary | ICD-10-CM

## 2023-09-25 DIAGNOSIS — Z1231 Encounter for screening mammogram for malignant neoplasm of breast: Secondary | ICD-10-CM

## 2023-09-25 NOTE — Patient Instructions (Signed)
Michelle Park , Thank you for taking time to come for your Medicare Wellness Visit. I appreciate your ongoing commitment to your health goals. Please review the following plan we discussed and let me know if I can assist you in the future.   Referrals/Orders/Follow-Ups/Clinician Recommendations:   You have an order for:  []   2D Mammogram  [x]   3D Mammogram  []   Bone Density     Please call for appointment:  [facility]  [address] [place] [address] [phone]  [facility] [address] [place], [place] [address] [phone]  [facility] [address] [place], [place] [address] [phone]    Make sure to wear two-piece clothing.  No lotions, powders, or deodorants the day of the appointment. Make sure to bring picture ID and insurance card.  Bring list of medications you are currently taking including any supplements.   Schedule your [place] screening mammogram through MyChart!   Log into your MyChart account.  Go to 'Visit' (or 'Appointments' if on mobile App) --> Schedule an Appointment  Under 'Select a Reason for Visit' choose the Mammogram Screening option.  Complete the pre-visit questions and select the time and place that best fits your schedule.    This is a list of the screening recommended for you and due dates:  Health Maintenance  Topic Date Due   Hepatitis C Screening  Never done   Cologuard (Stool DNA test)  Never done   Screening for Lung Cancer  Never done   Zoster (Shingles) Vaccine (1 of 2) 07/19/2000   COVID-19 Vaccine (1 - 2024-25 season) Never done   Flu Shot  11/02/2023*   Medicare Annual Wellness Visit  09/24/2024   Mammogram  11/19/2024   DTaP/Tdap/Td vaccine (5 - Td or Tdap) 11/25/2030   Pneumonia Vaccine  Completed   DEXA scan (bone density measurement)  Completed   HPV Vaccine  Aged Out    Colon Cancer Screening  Discontinued  *Topic was postponed. The date shown is not the original due date.    Advanced directives: (Copy Requested) Please bring a copy of your health care power of attorney and living will to the office to be added to your chart at your convenience.  Next Medicare Annual Wellness Visit scheduled for next year: Yes 09/26/24 @ 3pm televsit

## 2023-09-25 NOTE — Progress Notes (Signed)
Subjective:   Michelle Park is a 73 y.o. who presents for a Medicare Wellness preventive visit.  Visit Complete: Virtual I connected with  Michelle Park on 09/25/23 by a audio enabled telemedicine application and verified that I am speaking with the correct person using two identifiers.  Patient Location: Home  Provider Location: Home Office  I discussed the limitations of evaluation and management by telemedicine. The patient expressed understanding and agreed to proceed.  Vital Signs: Because this visit was a virtual/telehealth visit, some criteria may be missing or patient reported. Any vitals not documented were not able to be obtained and vitals that have been documented are patient reported.  VideoDeclined- This patient declined Librarian, academic. Therefore the visit was completed with audio only.  AWV Questionnaire: No: Patient Medicare AWV questionnaire was not completed prior to this visit.  Cardiac Risk Factors include: advanced age (>34men, >49 women);dyslipidemia;hypertension;obesity (BMI >30kg/m2);sedentary lifestyle;smoking/ tobacco exposure     Objective:    Today's Vitals   09/25/23 1423 09/25/23 1424  Weight: 217 lb (98.4 kg)   Height: 5\' 5"  (1.651 m)   PainSc:  3    Body mass index is 36.11 kg/m.     09/25/2023    2:37 PM 07/09/2023    1:28 PM 05/04/2023    1:48 PM 08/19/2022    6:10 PM 05/16/2021    4:25 PM 02/21/2021    2:38 PM 04/01/2019    9:08 AM  Advanced Directives  Does Patient Have a Medical Advance Directive? Yes Yes Yes No Unable to assess, patient is non-responsive or altered mental status Yes Yes  Type of Advance Directive Healthcare Power of Attorney Living will;Healthcare Power of [employer] Power of [employer] Power of [place];Living will  Does patient want to make changes to medical advance directive?  No - Patient declined No - Patient declined    No - Patient declined  Copy of Healthcare  Power of Attorney in Chart?  No - copy requested No - copy requested    No - copy requested  Would patient like information on creating a medical advance directive?    No - Patient declined   No - Patient declined    Current Medications (verified) Outpatient Encounter Medications as of 09/25/2023  Medication Sig   ALPRAZolam (XANAX) 0.5 MG tablet Take 1 tablet by mouth three times daily as needed   aspirin EC 81 MG tablet Take 81 mg by mouth at bedtime.   clopidogrel (PLAVIX) 75 MG tablet Take 1 tablet by mouth once daily   escitalopram (LEXAPRO) 20 MG tablet Take 1 tablet by mouth once daily   fenofibrate 160 MG tablet TAKE 1 TABLET BY MOUTH ONCE DAILY AFTER SUPPER   lansoprazole (PREVACID) 30 MG capsule Take 1 capsule (30 mg total) by mouth daily.   losartan (COZAAR) 100 MG tablet Take 1 tablet by mouth once daily   rosuvastatin (CRESTOR) 5 MG tablet TAKE 1 TABLET BY MOUTH AT BEDTIME   methocarbamol (ROBAXIN) 500 MG tablet Take 1 tablet (500 mg total) by mouth every 6 (six) hours as needed for muscle spasms. (Patient not taking: Reported on 09/25/2023)   oxyCODONE-acetaminophen (PERCOCET/ROXICET) 5-325 MG tablet Take 1-2 tablets by mouth every 4 (four) hours as needed for severe pain (pain score 7-10). (Patient not taking: Reported on 09/25/2023)   No facility-administered encounter medications on file as of 09/25/2023.    Allergies (verified) Boniva [ibandronate], Chantix [varenicline], and Lipitor [atorvastatin]  History: Past Medical History:  Diagnosis Date   Allergic rhinitis due to pollen    Anxiety    Aortic stenosis    Bell's palsy    Bronchitis    hx of   Chronic back pain    COPD (chronic obstructive pulmonary disease) (HCC)    Depression    Dyspnea    with activity only   Episodic mood disorder (HCC)    Fibromyalgia    GERD (gastroesophageal reflux disease)    Headache    frequent   Heart murmur    Hyperlipidemia    Hypertension    Iron deficiency    Left  carotid stenosis    PONV (postoperative nausea and vomiting)    Restless leg syndrome    Spinal stenosis    Stroke Procedure [facility])    Syncope    Past Surgical History:  Procedure Laterality Date   BACK SURGERY     2019 L spine Dr. Wynetta Emery   carotid artery surgery  09/2022   ESOPHAGOGASTRODUODENOSCOPY     EYE SURGERY     cataract bil   FOOT SURGERY  1979 and 1988   ingrown toenail     bil great toes   surgery   ROBOTIC ADRENALECTOMY Left 04/01/2019   Procedure: XI ROBOTIC LEFT ADRENALECTOMY;  Surgeon: Axel Filler, MD;  Location: [facility];  Service: General;  Laterality: Left;   TRANSFORAMINAL LUMBAR INTERBODY FUSION (TLIF) WITH PEDICLE SCREW FIXATION 1 LEVEL Right 07/15/2023   Procedure: RIGHT-SIDED LUMBAR 4 - LUMBAR 5 TRANSFORAMINAL LUMBAR INTERBODY FUSION AND DECOMPRESSION WITH INSTRUMENTATION AND ALLOGRAFT;  Surgeon: Estill Bamberg, MD;  Location: [facility];  Service: Orthopedics;  Laterality: Right;   Family History  Problem Relation Age of Onset   Heart disease Mother    Alzheimer's disease Mother    Cancer Sister    Colon cancer Neg Hx    Esophageal cancer Neg Hx    Inflammatory bowel disease Neg Hx    Liver disease Neg Hx    Pancreatic cancer Neg Hx    Rectal cancer Neg Hx    Stomach cancer Neg Hx    Breast cancer Neg Hx    Social History   Socioeconomic History   Marital status: Divorced    Spouse name: Not on file   Number of children: 1   Years of education: Not on file   Highest education level: Some college, no degree  Occupational History   Occupation: [employer] administrator    Comment: Retired  Tobacco Use   Smoking status: Every Day    Current packs/day: 1.00    Average packs/day: 1 pack/day for 54.1 years (54.1 ttl pk-yrs)    Types: Cigarettes    Start date: 64    Passive exposure: Current (Daughter)   Smokeless tobacco: Never  Vaping Use   Vaping status: Never Used  Substance and Sexual Activity   Alcohol use: Not Currently   Drug use: No   Sexual  activity: Not Currently  Other Topics Concern   Not on file  Social History Narrative   Goes by [employer].   1 daughter   Social Drivers of Corporate investment banker Strain: Low Risk  (09/25/2023)   Overall Financial Resource Strain (CARDIA)    Difficulty of Paying Living Expenses: Not hard at all  Food Insecurity: No Food Insecurity (09/25/2023)   Hunger Vital Sign    Worried About Running Out of Food in the Last Year: Never true    Ran Out of  Food in the Last Year: Never true  Transportation Needs: No Transportation Needs (09/25/2023)   PRAPARE - Administrator, Civil Service (Medical): No    Lack of Transportation (Non-Medical): No  Physical Activity: Inactive (09/25/2023)   Exercise Vital Sign    Days of Exercise per Week: 0 days    Minutes of Exercise per Session: 0 min  Stress: No Stress Concern Present (09/25/2023)   [employer] of Occupational Health - Occupational Stress Questionnaire    Feeling of Stress : Only a little  Social Connections: Socially Isolated (09/25/2023)   Social Connection and Isolation Panel [NHANES]    Frequency of Communication with Friends and Family: More than three times a week    Frequency of Social Gatherings with Friends and Family: Once a week    Attends Religious Services: Never    Database administrator or Organizations: No    Attends Engineer, structural: Never    Marital Status: Divorced    Tobacco Counseling Ready to quit: Not Answered Counseling given: Not Answered    Clinical Intake:  Pre-visit preparation completed: Yes  Pain : 0-10 Pain Score: 3  Pain Type: Chronic pain Pain Location: Back Pain Orientation: Lower Pain Descriptors / Indicators: Aching Pain Onset: More than a month ago Pain Frequency: Constant Pain Relieving Factors: Tylenol  Pain Relieving Factors: Tylenol  BMI - recorded: 36.11 Nutritional Status: BMI > 30  Obese Nutritional Risks: None Diabetes: No  How often do you need  to have someone help you when you read instructions, pamphlets, or other written materials from your doctor or pharmacy?: 1 - Never  Interpreter Needed?: No  Comments: daughter lives with pt Information entered by :: B.Arnesia Vincelette,LPN  Activities of Daily Living     09/25/2023    2:38 PM 07/09/2023    1:33 PM  In your present state of health, do you have any difficulty performing the following activities:  Hearing? 0   Vision? 1   Difficulty concentrating or making decisions? 0   Walking or climbing stairs? 1   Dressing or bathing? 0   Doing errands, shopping? 0 1  Preparing Food and eating ? N   Using the Toilet? N   In the past six months, have you accidently leaked urine? N   Do you have problems with loss of bowel control? N   Managing your Medications? N   Managing your Finances? N   Housekeeping or managing your Housekeeping? N     Patient Care Team: Joaquim Nam, MD as PCP - General (Family Medicine) Wendall Stade, MD as PCP - Cardiology (Cardiology)  Indicate any recent Medical Services you may have received from other than [facility] providers in the past year (date may be approximate).     Assessment:   This is a routine wellness examination for Estha.  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 eye;rt eye is alright;wears readers only Dr Ernesto Rutherford   Goals Addressed             This Visit's Progress    Patient Stated       09/25/23-I would like to go to the [facility] and quit smoking after surgeon clears me       Depression Screen     09/25/2023    2:32 PM 12/22/2022   12:47 PM 11/25/2022    2:17 PM 10/10/2022  3:37 PM 08/19/2022    9:10 AM 08/12/2022   12:16 PM 08/08/2021    2:02 PM  PHQ 2/9 Scores  PHQ - 2 Score 2 3 3  2  0 6  PHQ- 9 Score 7 8 9  13  19   Exception Documentation    Patient refusal       Fall Risk     09/25/2023    2:26 PM 12/22/2022    12:46 PM 11/25/2022    2:17 PM 10/10/2022    3:37 PM 08/19/2022    9:10 AM  Fall Risk   Falls in the past year? 0 0 0 1 1  Number falls in past yr: 0 0 0 0 0  Injury with Fall? 0 0 0 0 0  Risk for fall due to : No Fall Risks No Fall Risks No Fall Risks No Fall Risks No Fall Risks  Follow up Education provided;Falls prevention discussed Falls evaluation completed Falls evaluation completed Falls evaluation completed Falls evaluation completed    MEDICARE RISK AT HOME:  Medicare Risk at Home Any stairs in or around the home?: No If so, are there any without handrails?: No Home free of loose throw rugs in walkways, pet beds, electrical cords, etc?: Yes Adequate lighting in your home to reduce risk of falls?: Yes Life alert?: No Use of a cane, walker or w/c?: Yes (cane sometimes) Grab bars in the bathroom?: Yes Shower chair or bench in shower?: Yes Elevated toilet seat or a handicapped toilet?: No  TIMED UP AND GO:  Was the test performed?  No  Cognitive Function: 6CIT completed        09/25/2023    2:40 PM  6CIT Screen  What Year? 0 points  What month? 0 points  What time? 0 points  Count back from 20 0 points  Months in reverse 0 points  Repeat phrase 2 points  Total Score 2 points    Immunizations Immunization History  Administered Date(s) Administered   Hepatitis B, ADULT 05/10/2012   Influenza Split 05/10/2012, 06/14/2014   Influenza,inj,Quad PF,6+ Mos 05/02/2013, 05/31/2015   Influenza-Unspecified 05/01/2016, 05/04/2017, 09/20/2018   Pneumococcal Conjugate-13 07/04/2015, 05/04/2017   Pneumococcal Polysaccharide-23 05/01/2016   Td 06/07/2009   Tdap 04/16/2012, 04/25/2015, 11/24/2020   Zoster, Live 04/25/2015    Screening Tests Health Maintenance  Topic Date Due   Hepatitis C Screening  Never done   Fecal DNA (Cologuard)  Never done   Lung Cancer Screening  Never done   Zoster Vaccines- Shingrix (1 of 2) 07/19/2000   COVID-19 Vaccine (1 - 2024-25 season)  Never done   INFLUENZA VACCINE  11/02/2023 (Originally 03/05/2023)   Medicare Annual Wellness (AWV)  09/24/2024   MAMMOGRAM  11/19/2024   DTaP/Tdap/Td (5 - Td or Tdap) 11/25/2030   Pneumonia Vaccine 41+ Years old  Completed   DEXA SCAN  Completed   HPV VACCINES  Aged Out   Colonoscopy  Discontinued    Health Maintenance  Health Maintenance Due  Topic Date Due   Hepatitis C Screening  Never done   Fecal DNA (Cologuard)  Never done   Lung Cancer Screening  Never done   Zoster Vaccines- Shingrix (1 of 2) 07/19/2000   COVID-19 Vaccine (1 - 2024-25 season) Never done   Health Maintenance Items Addressed: Mammogram ordered  Additional Screening:  Vision Screening: Recommended annual ophthalmology exams for early detection of glaucoma and other disorders of the eye.  Dental Screening: Recommended annual dental exams for proper oral hygiene  Community Resource Referral / Chronic Care Management: CRR required this visit?  No   CCM required this visit?  No    Plan:     I have personally reviewed and noted the following in the patient's chart:   Medical and social history Use of alcohol, tobacco or illicit drugs  Current medications and supplements including opioid prescriptions. Patient is not currently taking opioid prescriptions. Functional ability and status Nutritional status Physical activity Advanced directives List of other physicians Hospitalizations, surgeries, and ER visits in previous 12 months Vitals Screenings to include cognitive, depression, and falls Referrals and appointments  In addition, I have reviewed and discussed with patient certain preventive protocols, quality metrics, and best practice recommendations. A written personalized care plan for preventive services as well as general preventive health recommendations were provided to patient.    Sue Lush, LPN   1/61/0960   After Visit Summary: (MyChart) Due to this being a telephonic visit,  the after visit summary with patients personalized plan was offered to patient via MyChart   Notes: Nothing significant to report at this time. Vaccinations: declines: Influenza vaccine: recommend every Fall Shingles vaccine: recommend Shingrix which is 2 doses 2-6 months apart and over 90% effective     Covid-19: recommend 2 doses one month apart with a booster 6 months later

## 2023-09-25 NOTE — Telephone Encounter (Signed)
 Left voicemail for patient to return call to office.

## 2023-10-16 ENCOUNTER — Encounter: Payer: Medicare Other | Admitting: Family Medicine

## 2023-10-29 ENCOUNTER — Other Ambulatory Visit: Payer: Self-pay | Admitting: Family Medicine

## 2023-10-30 NOTE — Telephone Encounter (Signed)
 Last filled 09-08-23 #90 Last OV 05-14-23 No Future OV [facility] [address]

## 2023-11-01 NOTE — Telephone Encounter (Signed)
 Sent with request to schedule yearly visit. Thanks.

## 2023-11-30 ENCOUNTER — Other Ambulatory Visit: Payer: Self-pay | Admitting: Family Medicine

## 2023-12-01 NOTE — Telephone Encounter (Signed)
 Patient is scheduled 5/1 so do you want me to hold it until she is seen

## 2023-12-02 ENCOUNTER — Other Ambulatory Visit: Payer: Self-pay | Admitting: Family Medicine

## 2023-12-02 NOTE — Telephone Encounter (Signed)
Okay to send, done. Thanks.

## 2023-12-03 ENCOUNTER — Ambulatory Visit (INDEPENDENT_AMBULATORY_CARE_PROVIDER_SITE_OTHER): Admitting: Family Medicine

## 2023-12-03 ENCOUNTER — Encounter: Payer: Self-pay | Admitting: Family Medicine

## 2023-12-03 VITALS — BP 128/68 | HR 79 | Temp 98.8°F | Ht 63.39 in | Wt 207.4 lb

## 2023-12-03 DIAGNOSIS — I6529 Occlusion and stenosis of unspecified carotid artery: Secondary | ICD-10-CM

## 2023-12-03 DIAGNOSIS — I10 Essential (primary) hypertension: Secondary | ICD-10-CM

## 2023-12-03 DIAGNOSIS — L989 Disorder of the skin and subcutaneous tissue, unspecified: Secondary | ICD-10-CM | POA: Diagnosis not present

## 2023-12-03 DIAGNOSIS — Z7189 Other specified counseling: Secondary | ICD-10-CM

## 2023-12-03 DIAGNOSIS — Z0000 Encounter for general adult medical examination without abnormal findings: Secondary | ICD-10-CM

## 2023-12-03 DIAGNOSIS — I350 Nonrheumatic aortic (valve) stenosis: Secondary | ICD-10-CM

## 2023-12-03 DIAGNOSIS — R739 Hyperglycemia, unspecified: Secondary | ICD-10-CM | POA: Diagnosis not present

## 2023-12-03 DIAGNOSIS — F419 Anxiety disorder, unspecified: Secondary | ICD-10-CM

## 2023-12-03 DIAGNOSIS — R0602 Shortness of breath: Secondary | ICD-10-CM

## 2023-12-03 DIAGNOSIS — Z8673 Personal history of transient ischemic attack (TIA), and cerebral infarction without residual deficits: Secondary | ICD-10-CM | POA: Diagnosis not present

## 2023-12-03 DIAGNOSIS — M8580 Other specified disorders of bone density and structure, unspecified site: Secondary | ICD-10-CM

## 2023-12-03 DIAGNOSIS — G8929 Other chronic pain: Secondary | ICD-10-CM

## 2023-12-03 DIAGNOSIS — F418 Other specified anxiety disorders: Secondary | ICD-10-CM

## 2023-12-03 DIAGNOSIS — E785 Hyperlipidemia, unspecified: Secondary | ICD-10-CM

## 2023-12-03 MED ORDER — ROSUVASTATIN CALCIUM 5 MG PO TABS: 5.0000 mg | ORAL_TABLET | Freq: Every day | ORAL | 3 refills | Status: AC

## 2023-12-03 MED ORDER — CLOPIDOGREL BISULFATE 75 MG PO TABS: 75.0000 mg | ORAL_TABLET | Freq: Every day | ORAL | 3 refills | Status: AC

## 2023-12-03 MED ORDER — ESCITALOPRAM OXALATE 20 MG PO TABS: 20.0000 mg | ORAL_TABLET | Freq: Every day | ORAL | 0 refills | Status: DC

## 2023-12-03 MED ORDER — LANSOPRAZOLE 30 MG PO CPDR: 30.0000 mg | DELAYED_RELEASE_CAPSULE | Freq: Every day | ORAL | Status: DC

## 2023-12-03 MED ORDER — LOSARTAN POTASSIUM 100 MG PO TABS: 50.0000 mg | ORAL_TABLET | Freq: Every day | ORAL | Status: DC

## 2023-12-03 NOTE — Patient Instructions (Addendum)
 Go to the lab on the way out.   If you have mychart we'll likely use that to update you.     Please call the dental clinic and ask if they can take care of your procedure while continuing plavix .  If they advise you to come off it, then let me know.   Please call about a mammogram.    Please call about follow up with GI.  [facility] [address] [place] [address] [place], [place] [address] [phone]  If the scalp lesions gets bigger then let me know.   Try cutting the losartan  in half and see if that helps with getting lightheaded.    You should get a call about seeing psychiatry.    Please call about follow up at [facility].    Take care.  Glad to see you.

## 2023-12-03 NOTE — Progress Notes (Unsigned)
 Hypertension:    Using medication without problems or lightheadedness: she can get lightheaded on standing.  Chest pain with exertion:no Edema:no Short of breath: yes, with exertion.  Going on for "a long time."  Worse in the last 6 months.   Smoking cessation encouraged.   Labs pending.   Carotid f/u per [facility]. She is going to call about follow up.  D/w pt.    Scalp lesion, 2 mm benign appearing papule vs open comedone.  No erythema.  See exam.  Elevated Cholesterol: Using medications without problems:yes Muscle aches: no Diet compliance: d/w pt.  Exercise: d/w pt.   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  SEM noted today.   H/o CVA.  On plavix .   D/w pt about upcoming dental eval re: plavix , see AVS.   Anxiety.  Still on lexapro .  Taking xanax  prn.  No ADE on med.  No SI/HI.  Still "feeling down."   Her back pain is clearly better after prev surgery.    Flu encouraged Shingles discussed with patient, she had Zostavax in 2016. PNA prev done.  Tetanus 2022 COVID-vaccine prev encouraged. Colon cancer screening discussed, I asked her to call about GI f/u.   Breast cancer screening ordered.   Bone density test 2023, discussed with patient. Declined tx 2025.   Advance directive-daughter designated if patient were incapacitated. HCV screening d/w pt.  Declined by patient.    Meds, vitals, and allergies reviewed.   ROS: Per HPI unless specifically indicated in ROS section   GEN: nad, alert and oriented HEENT: mucous membranes moist, NCAT, scalp with 2 mm benign appearing papule vs open comedone.  No erythema.  Located posterior to the right ear. NECK: supple w/o LA CV: rrr.  SEM noted.  PULM: ctab, no inc wob ABD: soft, +bs EXT: no edema SKIN: no acute rash  See after visit summary.  40 minutes were devoted to patient care in this encounter (this includes time spent reviewing the patient's file/history, interviewing and examining the patient,  counseling/reviewing plan with patient).

## 2023-12-04 LAB — CBC WITH DIFFERENTIAL/PLATELET
Basophils Absolute: 0.1 10*3/uL (ref 0.0–0.1)
Basophils Relative: 0.8 % (ref 0.0–3.0)
Eosinophils Absolute: 0.1 10*3/uL (ref 0.0–0.7)
Eosinophils Relative: 1.6 % (ref 0.0–5.0)
HCT: 37.9 % (ref 36.0–46.0)
Hemoglobin: 12.4 g/dL (ref 12.0–15.0)
Lymphocytes Relative: 17.5 % (ref 12.0–46.0)
Lymphs Abs: 1.3 10*3/uL (ref 0.7–4.0)
MCHC: 32.7 g/dL (ref 30.0–36.0)
MCV: 85.4 fl (ref 78.0–100.0)
Monocytes Absolute: 0.6 10*3/uL (ref 0.1–1.0)
Monocytes Relative: 8.4 % (ref 3.0–12.0)
Neutro Abs: 5.5 10*3/uL (ref 1.4–7.7)
Neutrophils Relative %: 71.7 % (ref 43.0–77.0)
Platelets: 436 10*3/uL — ABNORMAL HIGH (ref 150.0–400.0)
RBC: 4.44 Mil/uL (ref 3.87–5.11)
RDW: 15.6 % — ABNORMAL HIGH (ref 11.5–15.5)
WBC: 7.6 10*3/uL (ref 4.0–10.5)

## 2023-12-04 LAB — COMPREHENSIVE METABOLIC PANEL WITH GFR
ALT: 10 U/L (ref 0–35)
AST: 13 U/L (ref 0–37)
Albumin: 4.1 g/dL (ref 3.5–5.2)
Alkaline Phosphatase: 85 U/L (ref 39–117)
BUN: 16 mg/dL (ref 6–23)
CO2: 26 meq/L (ref 19–32)
Calcium: 9.9 mg/dL (ref 8.4–10.5)
Chloride: 99 meq/L (ref 96–112)
Creatinine, Ser: 0.96 mg/dL (ref 0.40–1.20)
GFR: 58.75 mL/min — ABNORMAL LOW (ref >60.00)
Glucose, Bld: 99 mg/dL (ref 70–99)
Potassium: 5 meq/L (ref 3.5–5.1)
Sodium: 132 meq/L — ABNORMAL LOW (ref 135–145)
Total Bilirubin: 0.5 mg/dL (ref 0.2–1.2)
Total Protein: 6.8 g/dL (ref 6.0–8.3)

## 2023-12-04 LAB — LIPID PANEL
Cholesterol: 180 mg/dL (ref 0–200)
HDL: 41.7 mg/dL (ref >39.00)
LDL Cholesterol: 109 mg/dL — ABNORMAL HIGH (ref 0–99)
NonHDL: 138.13
Total CHOL/HDL Ratio: 4
Triglycerides: 145 mg/dL (ref 0.0–149.0)
VLDL: 29 mg/dL (ref 0.0–40.0)

## 2023-12-04 LAB — BRAIN NATRIURETIC PEPTIDE: Pro B Natriuretic peptide (BNP): 30 pg/mL (ref 0.0–100.0)

## 2023-12-04 LAB — VITAMIN B12: Vitamin B-12: 296 pg/mL (ref 211–911)

## 2023-12-04 LAB — TSH: TSH: 1.66 u[IU]/mL (ref 0.35–5.50)

## 2023-12-04 LAB — VITAMIN D 25 HYDROXY (VIT D DEFICIENCY, FRACTURES): VITD: 35.34 ng/mL (ref 30.00–100.00)

## 2023-12-06 ENCOUNTER — Encounter: Payer: Self-pay | Admitting: Family Medicine

## 2023-12-06 DIAGNOSIS — L989 Disorder of the skin and subcutaneous tissue, unspecified: Secondary | ICD-10-CM | POA: Insufficient documentation

## 2023-12-06 DIAGNOSIS — Z0000 Encounter for general adult medical examination without abnormal findings: Secondary | ICD-10-CM | POA: Insufficient documentation

## 2023-12-06 NOTE — Assessment & Plan Note (Signed)
 2 mm benign appearing papule vs open comedone.  No erythema.  Located posterior to the right ear.  Appears benign.  Would observe for now.  Discussed with patient.

## 2023-12-06 NOTE — Assessment & Plan Note (Signed)
 She can try cutting the losartan  in half and see if that helps with getting lightheaded.   Smoking cessation encouraged.   Labs pending.

## 2023-12-06 NOTE — Assessment & Plan Note (Signed)
 Refer to psychiatry.  Continue Lexapro  with as needed alprazolam  in the meantime.

## 2023-12-06 NOTE — Assessment & Plan Note (Signed)
 For now would continue on plavix .   D/w pt about upcoming dental eval re: plavix , see AVS. she can check with the dental clinic to see if they are requesting her to stop Plavix .

## 2023-12-06 NOTE — Assessment & Plan Note (Signed)
 Fortunately this is improved after previous surgery.

## 2023-12-06 NOTE — Assessment & Plan Note (Signed)
 Follow-up echo ordered.  Await results.

## 2023-12-06 NOTE — Assessment & Plan Note (Signed)
Advance directive- daughter designated if patient were incapacitated.   

## 2023-12-06 NOTE — Assessment & Plan Note (Signed)
Continue Crestor.  See notes on labs. 

## 2023-12-06 NOTE — Assessment & Plan Note (Signed)
 Carotid f/u per [facility]. She is going to call about follow up.  D/w pt.

## 2023-12-06 NOTE — Assessment & Plan Note (Signed)
 Flu encouraged Shingles discussed with patient, she had Zostavax in 2016. PNA prev done.  Tetanus 2022 COVID-vaccine prev encouraged. Colon cancer screening discussed, I asked her to call about GI f/u.   Breast cancer screening ordered.   Bone density test 2023, discussed with patient. Declined tx 2025.   Advance directive-daughter designated if patient were incapacitated. HCV screening d/w pt.  Declined by patient.

## 2023-12-21 ENCOUNTER — Encounter (HOSPITAL_COMMUNITY): Payer: Self-pay | Admitting: Family Medicine

## 2023-12-22 ENCOUNTER — Other Ambulatory Visit: Payer: Self-pay | Admitting: Family Medicine

## 2023-12-22 DIAGNOSIS — Z1231 Encounter for screening mammogram for malignant neoplasm of breast: Secondary | ICD-10-CM

## 2023-12-23 ENCOUNTER — Other Ambulatory Visit: Payer: Self-pay | Admitting: Medical Genetics

## 2023-12-29 ENCOUNTER — Other Ambulatory Visit: Payer: Self-pay | Admitting: Family Medicine

## 2023-12-29 NOTE — Telephone Encounter (Signed)
 Copied from CRM [id number]. Topic: Clinical - Medication Refill >> Dec 29, 2023  2:19 PM Leah C wrote: Medication: ALPRAZolam  (XANAX ) 0.5 MG tablet  Has the patient contacted their pharmacy? Yes (Agent: If no, request that the patient contact the pharmacy for the refill. If patient does not wish to contact the pharmacy document the reason why and proceed with request.) (Agent: If yes, when and what did the pharmacy advise?)  This is the patient's preferred pharmacy:  [facility] [address] [place], [place] - [address] [place] [address] Phone: [phone] Fax: [phone]  Is this the correct pharmacy for this prescription? Yes If no, delete pharmacy and type the correct one.   Has the prescription been filled recently? Yes  Is the patient out of the medication? Patient has one pill left.   Has the patient been seen for an appointment in the last year OR does the patient have an upcoming appointment? Yes  Can we respond through MyChart? Yes  Agent: Please be advised that Rx refills may take up to 3 business days. We ask that you follow-up with your pharmacy.

## 2023-12-30 ENCOUNTER — Other Ambulatory Visit: Payer: Self-pay | Admitting: Family Medicine

## 2023-12-30 ENCOUNTER — Ambulatory Visit
Admission: RE | Admit: 2023-12-30 | Discharge: 2023-12-30 | Disposition: A | Discharge status: Home / Self Care | Source: Ambulatory Visit

## 2023-12-30 DIAGNOSIS — Z1231 Encounter for screening mammogram for malignant neoplasm of breast: Secondary | ICD-10-CM

## 2023-12-31 ENCOUNTER — Other Ambulatory Visit: Payer: Self-pay

## 2024-01-01 ENCOUNTER — Other Ambulatory Visit: Payer: Self-pay | Admitting: Family Medicine

## 2024-01-01 MED ORDER — ALPRAZOLAM 0.5 MG PO TABS: 0.5000 mg | ORAL_TABLET | Freq: Three times a day (TID) | ORAL | 0 refills | Status: DC | PRN

## 2024-01-01 NOTE — Telephone Encounter (Signed)
 Sent.  When she is seeing psychiatry?  Please let me know.  Thanks.

## 2024-01-01 NOTE — Telephone Encounter (Signed)
 Left voicemail for patient to return call to office.

## 2024-01-01 NOTE — Telephone Encounter (Signed)
 See other note

## 2024-01-05 NOTE — Telephone Encounter (Signed)
 Left voicemail for patient to return call to office.

## 2024-01-06 ENCOUNTER — Ambulatory Visit: Payer: Self-pay | Admitting: Family Medicine

## 2024-01-06 NOTE — Telephone Encounter (Signed)
 Patient declines psychiatry evaluation and treatment.  I will defer to patient.

## 2024-01-27 ENCOUNTER — Other Ambulatory Visit

## 2024-01-27 DIAGNOSIS — Z006 Encounter for examination for normal comparison and control in clinical research program: Secondary | ICD-10-CM

## 2024-02-02 ENCOUNTER — Ambulatory Visit: Payer: Self-pay

## 2024-02-02 NOTE — Telephone Encounter (Signed)
 FYI Only or Action Required?: FYI only for provider.  Patient was last seen in primary care on 12/03/2023 by Cleatus Arlyss RAMAN, MD. Called Nurse Triage reporting Pain. Symptoms began x 3 days. Interventions attempted: Nothing. Symptoms are: gradually worsening.  Triage Disposition: See PCP When Office is Open (Within 3 Days)  Patient/caregiver understands and will follow disposition?: Yes   Copied from CRM [id number]. Topic: Clinical - Red Word Triage >> Feb 02, 2024 12:11 PM [place] A wrote: [facility] that prompted transfer to Nurse Triage: Patient said for past 3 days hands,legs, shoulders are in pain Reason for Disposition  [1] MODERATE longstanding difficulty breathing (e.g., speaks in phrases, SOB even at rest, pulse 100-120) AND [2] SAME as normal  [1] MODERATE pain (e.g., interferes with normal activities) AND [2] present > 3 days  Answer Assessment - Initial Assessment Questions 1. ONSET: When did the muscle aches or body pains start?      X 3 days 2. LOCATION: What part of your body is hurting? (e.g., entire body, arms, legs)      hands,legs, shoulders 3. SEVERITY: How bad is the pain? (Scale 1-10; or mild, moderate, severe)   - MILD (1-3): doesn't interfere with normal activities    - MODERATE (4-7): interferes with normal activities or awakens from sleep    - SEVERE (8-10):  excruciating pain, unable to do any normal activities      Mild to moderate 4. CAUSE: What do you think is causing the pains?     unknown 5. FEVER: Have you been having fever?     no 6. OTHER SYMPTOMS: Do you have any other symptoms? (e.g., chest pain, weakness, rash, cold or flu symptoms, weight loss)     no 7. PREGNANCY: Is there any chance you are pregnant? When was your last menstrual period?     N/a 8. TRAVEL: Have you traveled out of the country in the last month? (e.g., travel history, exposures)     N/a  Answer Assessment - Initial Assessment Questions 1. RESPIRATORY STATUS:  Describe your breathing? (e.g., wheezing, shortness of breath, unable to speak, severe coughing)      Wheezing, coughing 2. ONSET: When did this breathing problem begin?      X 3 days 3. PATTERN Does the difficult breathing come and go, or has it been constant since it started?      Comes and goes 4. SEVERITY: How bad is your breathing? (e.g., mild, moderate, severe)    - MILD: No SOB at rest, mild SOB with walking, speaks normally in sentences, can lie down, no retractions, pulse < 100.    - MODERATE: SOB at rest, SOB with minimal exertion and prefers to sit, cannot lie down flat, speaks in phrases, mild retractions, audible wheezing, pulse 100-120.    - SEVERE: Very SOB at rest, speaks in single words, struggling to breathe, sitting hunched forward, retractions, pulse > 120      moderate 5. RECURRENT SYMPTOM: Have you had difficulty breathing before? If Yes, ask: When was the last time? and What happened that time?      no 6. CARDIAC HISTORY: Do you have any history of heart disease? (e.g., heart attack, angina, bypass surgery, angioplasty)      N/a 7. LUNG HISTORY: Do you have any history of lung disease?  (e.g., pulmonary embolus, asthma, emphysema)     N.a 8. CAUSE: What do you think is causing the breathing problem?      unknown 9. OTHER  SYMPTOMS: Do you have any other symptoms? (e.g., dizziness, runny nose, cough, chest pain, fever)     na 10. O2 SATURATION MONITOR:  Do you use an oxygen saturation monitor (pulse oximeter) at home? If Yes, ask: What is your reading (oxygen level) today? What is your usual oxygen saturation reading? (e.g., 95%)       N/a 11. PREGNANCY: Is there any chance you are pregnant? When was your last menstrual period?       N/a 12. TRAVEL: Have you traveled out of the country in the last month? (e.g., travel history, exposures)       N/a  Pt states wheezing & coughing due to humidity and neighbors burning.  Protocols used:  Muscle Aches and Body Pain-A-AH, Breathing Difficulty-A-AH

## 2024-02-03 ENCOUNTER — Other Ambulatory Visit: Payer: Self-pay | Admitting: Family Medicine

## 2024-02-03 ENCOUNTER — Ambulatory Visit (HOSPITAL_COMMUNITY)

## 2024-02-04 ENCOUNTER — Ambulatory Visit (INDEPENDENT_AMBULATORY_CARE_PROVIDER_SITE_OTHER): Admitting: Family Medicine

## 2024-02-04 ENCOUNTER — Encounter: Payer: Self-pay | Admitting: Family Medicine

## 2024-02-04 VITALS — BP 136/88 | HR 83 | Temp 98.0°F | Ht 63.39 in | Wt 203.0 lb

## 2024-02-04 DIAGNOSIS — F419 Anxiety disorder, unspecified: Secondary | ICD-10-CM

## 2024-02-04 DIAGNOSIS — J0100 Acute maxillary sinusitis, unspecified: Secondary | ICD-10-CM | POA: Diagnosis not present

## 2024-02-04 MED ORDER — PREDNISONE 10 MG PO TABS: ORAL_TABLET | ORAL | 0 refills | Status: DC

## 2024-02-04 MED ORDER — ALPRAZOLAM 0.5 MG PO TABS: 0.5000 mg | ORAL_TABLET | Freq: Three times a day (TID) | ORAL | 1 refills | Status: DC | PRN

## 2024-02-04 MED ORDER — AMOXICILLIN-POT CLAVULANATE 875-125 MG PO TABS
1.0000 | ORAL_TABLET | Freq: Two times a day (BID) | ORAL | 0 refills | Status: DC
Start: 2024-02-04 — End: 2024-06-01

## 2024-02-04 NOTE — Patient Instructions (Signed)
 Start augmentin , add on prednisone  if needed/still wheezing.  Update us  as needed.  Take care.
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
## 2024-02-05 LAB — GENECONNECT MOLECULAR SCREEN: Genetic Analysis Overall Interpretation: NEGATIVE

## 2024-02-07 DIAGNOSIS — J0100 Acute maxillary sinusitis, unspecified: Secondary | ICD-10-CM | POA: Insufficient documentation

## 2024-02-07 NOTE — Assessment & Plan Note (Addendum)
 Start augmentin , add on prednisone  if needed/still wheezing.  Update us  as needed.  Suspect that her joint pain, fatigue, cough, wheeze, facial pain could all be related.  Unclear vertigo is related.  She could have a separate issue with BPV.  I still think it makes sense to treat for sinusitis and if she has residual symptoms we can address those as needed.  Discussed.

## 2024-02-07 NOTE — Assessment & Plan Note (Signed)
 Would continue Lexapro  daily with Xanax  as needed.

## 2024-02-08 ENCOUNTER — Telehealth: Payer: Self-pay | Admitting: *Deleted

## 2024-02-08 NOTE — Telephone Encounter (Signed)
 Copied from CRM [id number]. Topic: Clinical - Medical Advice >> Feb 08, 2024  4:37 PM Chiquita SQUIBB wrote: Reason for CRM: Patient is calling in stating that she has been taking everything and has not improved at all from the sore throat.

## 2024-02-09 MED ORDER — LIDOCAINE VISCOUS HCL 2 % MT SOLN
OROMUCOSAL | 0 refills | Status: DC
Start: 2024-02-09 — End: 2024-06-01

## 2024-02-09 NOTE — Telephone Encounter (Signed)
 She could try gargling with salt water and also using lidocaine  as needed.  I sent the prescription for lidocaine .  If not getting any better with that, then likely need to recheck.

## 2024-02-09 NOTE — Telephone Encounter (Signed)
 Noted. I'll defer to patient.

## 2024-02-09 NOTE — Telephone Encounter (Signed)
 Pt notified of Dr. Elfredia recommendation and that Rx for lidocaine  solution was sent into pharmacy. Pt said she doesn't have transportation to get med today. Pt advise she can try the warm salt water today until she can get a ride to the pharmacy. Pt said she will try that but did want PCP to know she stopped taking the abx yesterday because everything was wrong I asked pt to be a little more detailed on what was exactly wrong with her. Pt said it wasn't helping her sxs and she started having diarrhea and body aches (no fever) pt said throat is still sore also. Pt is still taking prednisone  and has 2 days left of that. Pt advised PCP suggested if salt water and lidocaine  isn't helping may need to be re-eval. Pt told me to tell PCP she isn't coming back in to be recheck for this and the call was ended by pt.  FYI to PCP

## 2024-02-15 ENCOUNTER — Telehealth: Payer: Self-pay

## 2024-02-15 MED ORDER — ALPRAZOLAM 0.5 MG PO TABS: 0.5000 mg | ORAL_TABLET | Freq: Three times a day (TID) | ORAL | 1 refills | Status: DC | PRN

## 2024-02-15 NOTE — Telephone Encounter (Signed)
 Patient notified

## 2024-02-15 NOTE — Addendum Note (Signed)
 Addended by: CLEATUS ARLYSS RAMAN on: 02/15/2024 04:33 PM   Modules accepted: Orders

## 2024-02-15 NOTE — Telephone Encounter (Signed)
 Sent. Thanks.

## 2024-02-15 NOTE — Telephone Encounter (Signed)
 Spoke with the pharmacist and they are willing to just give her a refill off the 90 if you just send in the rx. If so please let me know once sent and I can call the pharmacy back. Thank you

## 2024-02-15 NOTE — Telephone Encounter (Signed)
 Copied from CRM [id number]. Topic: Clinical - Medication Question >> Feb 15, 2024  8:49 AM Deaijah H wrote: Reason for CRM: Patient called in requesting to speak with Dr. Cleatus due to losing her prescription ALPRAZolam  (XANAX ) 0.5 MG tablet and not being able to find them. Would like 60 tablets to be sent in for remainder of month and will turn them in once she find them.

## 2024-02-16 ENCOUNTER — Encounter: Payer: Self-pay | Admitting: Family Medicine

## 2024-02-19 ENCOUNTER — Other Ambulatory Visit: Payer: Self-pay | Admitting: Family Medicine

## 2024-02-19 MED ORDER — FENOFIBRATE 160 MG PO TABS: ORAL_TABLET | ORAL | 3 refills | Status: AC

## 2024-02-25 ENCOUNTER — Other Ambulatory Visit: Payer: Self-pay | Admitting: Family Medicine

## 2024-02-29 ENCOUNTER — Telehealth: Payer: Self-pay | Admitting: Family Medicine

## 2024-02-29 DIAGNOSIS — E278 Other specified disorders of adrenal gland: Secondary | ICD-10-CM

## 2024-02-29 DIAGNOSIS — K219 Gastro-esophageal reflux disease without esophagitis: Secondary | ICD-10-CM

## 2024-02-29 NOTE — Telephone Encounter (Signed)
 Copied from CRM [id number]. Topic: Referral - Request for Referral >> Feb 29, 2024  1:13 PM Berneda FALCON wrote: Did the patient discuss referral with their provider in the last year? Yes (If No - schedule appointment) (If Yes - send message)  Appointment offered? No, patient states that the provider is not reading the chart and she needs this referral please.  Type of order/referral and detailed reason for visit: Endocrinology-Dr. Norleen glands were removed and she needs to be seen here once per year.   Preference of office, provider, location: Dr. Trixie, [address], [place], [place] [address], last seen in 2021  If referral order, have you been seen by this specialty before? Yes (If Yes, this issue or another issue? When? Where? Patient was seen at [address], [place], [place] [address] back in 2021   Can we respond through MyChart? Yes

## 2024-02-29 NOTE — Telephone Encounter (Signed)
 Copied from CRM [id number]. Topic: Clinical - Medication Question >> Feb 29, 2024  1:17 PM Berneda FALCON wrote: Reason for CRM: Pt states that this medication is too strong and she wants a 20 mg and wants a 90-day supply please.  lansoprazole  (PREVACID ) 30 MG capsule  [address] [place], [place] - [address] [place] [address] Phone: [phone] Fax: [phone] Hours: Not open 24 hours

## 2024-03-02 NOTE — Telephone Encounter (Addendum)
 Please verify the comment about patient states that the provider is not reading the chart.  I am reading her chart.  If she has concerns about that or her care here, she can schedule an appointment to discuss or she can transfer her care to another primary doc.   I put in the referral.    I saw her MyChart message about Prevacid  dosing.  She needs an office visit to discuss.  Thanks.

## 2024-03-02 NOTE — Addendum Note (Signed)
 Addended by: CLEATUS ARLYSS RAMAN on: 03/02/2024 07:46 PM   Modules accepted: Orders

## 2024-03-03 NOTE — Telephone Encounter (Signed)
 Left voicemail for patient to return call to office.

## 2024-03-04 MED ORDER — LANSOPRAZOLE 30 MG PO CPDR: 30.0000 mg | DELAYED_RELEASE_CAPSULE | Freq: Every day | ORAL | 0 refills | Status: DC

## 2024-03-04 NOTE — Telephone Encounter (Signed)
 E-scribed rx.  Left message on voicemail, per dpr, notifying pt a 90-day rx was sent [facility] [address] today.

## 2024-03-04 NOTE — Addendum Note (Signed)
 Addended by: Tron Flythe on: 03/04/2024 03:32 PM   Modules accepted: Orders

## 2024-03-04 NOTE — Telephone Encounter (Unsigned)
 Copied from CRM [id number]. Topic: Clinical - Medication Question >> Mar 03, 2024  5:42 PM Michelle Park ORN wrote: Reason for CRM: [employer] spoke with her PCP. Supposed to call in a 90 day supply of lansoprazole  (PREVACID ) 20 MG capsules and to have this medication at the [facility] on [address]. Spoke with a Nurse Shanon in the office who was supposed to send in the medication two days ago. Patient says that there is something wrong with the office and that it needs to be fixed immediately.

## 2024-03-07 DIAGNOSIS — I10 Essential (primary) hypertension: Secondary | ICD-10-CM | POA: Diagnosis not present

## 2024-03-07 DIAGNOSIS — I6523 Occlusion and stenosis of bilateral carotid arteries: Secondary | ICD-10-CM | POA: Diagnosis not present

## 2024-03-07 DIAGNOSIS — I6522 Occlusion and stenosis of left carotid artery: Secondary | ICD-10-CM | POA: Diagnosis not present

## 2024-03-07 DIAGNOSIS — I739 Peripheral vascular disease, unspecified: Secondary | ICD-10-CM | POA: Diagnosis not present

## 2024-03-07 NOTE — Progress Notes (Signed)
 Vascular Surgery Outpatient Clinic Note   DATE OF SERVICE: 03/07/2024   PRIMARY CARE PROVIDER: Cleatus Arlyss Gentry, MD [address] [place] [address]  REFERRING PHYSICIAN: Arlyss Gentry Cleatus, MD  CHIEF COMPLAINT: Follow-up for left ICA stenosis.  HPI   Rayelynn Loyal is a 73 y.o. female with with a previously known asymptomatic left ICA stenosis.  She was to follow-up within a month after her last appointment on 05/26/2022.  She did not knew that left CEA would be recommended and she wanted to wait till after the holidays.  However in late December, she developed acute onset in her left visual field consistent with amaurosis fugax and thus has now gone from an asymptomatic state to asymptomatic state.  She is here today and and would like to proceed with left carotid endarterectomy.  She will be scheduled accordingly.  She tells me that she was still smoking but quit within the past 2 weeks.  01/27/2023 INTERVAL UPDATE. Ms. Wilmouth had a left CEA done on 09/23/2022.  She has done well.  Her carotid duplex demonstrates no residual stenosis.  She has no new complaints.  She is still smoking and I have cautioned her again to consider cessation.  03/07/2024 INTERVAL UPDATE. Ms. Bouie continues to do well after left carotid endarterectomy done in February 2024.  Her carotid duplex today demonstrates no residual left-sided stenosis and no interval stenosis on the right side.  She will follow-up in 6 months with a repeat carotid duplex.  Medical History   Past Medical History:  Diagnosis Date  . Arthritis   . BPV (benign positional vertigo) 04/01/2020  . Branch retinal artery occlusion of left eye 08/01/2022  . Carotid stenosis 08/01/2022  . Chronic DOE (dyspnea on exertion) 08/13/2022  . COPD (chronic obstructive pulmonary disease) (CMS/HHS-HCC)   . Fibromyalgia 10/11/2015  . GERD (gastroesophageal reflux disease)   . History of stroke   . History of syncope 08/13/2022  .  Hyperlipidemia 08/01/2022  . Hypertension 08/01/2022  . Prediabetes 08/13/2022  . Tobacco abuse 08/01/2022     Past Surgical History:  Procedure Laterality Date  . THROMBOENDARTERECTOMY ARTERY CAROTID BY NECK INCISION Left 09/23/2022   Procedure: THROMBOENDARTERECTOMY, INCLUDING PATCH GRAFT, IF PERFORMED; CAROTID, BY NECK INCISION;  Surgeon: Viktoria Shila LELON Mickey., MD;  Location: [facility] OR;  Service: General Surgery;  Laterality: Left;  . adrenal adenoma resection Left    2020  . BACK SURGERY     2019     Family History  Problem Relation Name Age of Onset  . Stroke Mother    . Alzheimer's disease Mother    . Cancer Sister      Social History   Socioeconomic History  . Marital status: Divorced  Tobacco Use  . Smoking status: Every Day    Current packs/day: 0.00    Average packs/day: 1 pack/day for 68.1 years (68.1 ttl pk-yrs)    Types: Cigarettes    Start date: 07/04/1955    Last attempt to quit: 08/01/2022    Years since quitting: 1.6  . Smokeless tobacco: Never  Vaping Use  . Vaping status: Never Used  Substance and Sexual Activity  . Alcohol use: Not Currently  . Drug use: Not Currently  . Sexual activity: Defer  Social History Narrative   Lives in [place] with daughter.   Independent, drives.   Social Drivers of Health   Financial Resource Strain: High Risk (02/03/2024)   Received from [facility]   Overall Financial  Resource Strain (CARDIA)   . How hard is it for you to pay for the very basics like food, housing, medical care, and heating?: Very hard  Food Insecurity: Food Insecurity Present (02/03/2024)   Received from [facility]   Hunger Vital Sign   . Within the past 12 months, you worried that your food would run out before you got the money to buy more.: Sometimes true   . Within the past 12 months, the food you bought just didn't last and you didn't have money to get more.: Often true  Transportation Needs: Unmet Transportation Needs (02/03/2024)   Received  from [facility]   . In the past 12 months, has lack of transportation kept you from medical appointments or from getting medications?: Yes   . In the past 12 months, has lack of transportation kept you from meetings, work, or from getting things needed for daily living?: Yes     Allergies & Medications Allergies  Allergen Reactions  . Atorvastatin Other (See Comments)    REACTION: causes leg pain  . Ibandronate Other (See Comments)  . Simvastatin Other (See Comments)  . Varenicline Nausea    GI upset.  . Rosuvastatin  Other (See Comments)    Myalgias    Medications: Current Outpatient Medications  Medication Sig Dispense Refill  . ALPRAZolam  (XANAX ) 0.5 MG tablet Take 0.5 mg by mouth 3 (three) times daily as needed for Sleep or Anxiety    . ascorbic acid, vitamin C, (VITAMIN C) 500 MG tablet Take 500 mg by mouth once daily    . calcium  carbonate-vitamin D3 (CALTRATE 600+D) 600 mg-10 mcg (400 unit) tablet Take 2 tablets by mouth 3 (three) times daily with meals    . cholecalciferol 1000 unit tablet Take 1 tablet by mouth once daily    . escitalopram  oxalate (LEXAPRO ) 20 MG tablet Take 20 mg by mouth once daily    . fenofibrate  160 MG tablet Take 160 mg by mouth every evening    . lansoprazole  (PREVACID ) 30 MG DR capsule Take 30 mg by mouth once daily    . losartan  (COZAAR ) 100 MG tablet Take 0.5 tablets (50 mg total) by mouth once daily (Patient taking differently: Take 100 mg by mouth once daily)    . multivit,iron,minerals/lutein (CENTRUM SILVER ULTRA WOMEN'S ORAL) Take 1 capsule by mouth once daily    . zinc sulfate 50 mg zinc (220 mg) Tab Take 1 tablet by mouth once daily    . acetaminophen  (TYLENOL ) 325 MG tablet Take 2 tablets (650 mg total) by mouth every 6 (six) hours as needed for Pain    . aspirin 81 MG EC tablet Take 81 mg by mouth every evening    . ezetimibe  (ZETIA ) 10 mg tablet Take 1 tablet (10 mg total) by mouth once daily (Patient not  taking: Reported on 10/21/2022) 30 tablet 0  . nicotine (NICODERM CQ) 21 mg/24 hr patch Place 1 patch onto the skin once daily 30 patch 0  . pantoprazole  (PROTONIX ) 40 MG DR tablet Take 40 mg by mouth once daily    . rosuvastatin  (CRESTOR ) 5 MG tablet Take 1 tablet (5 mg total) by mouth at bedtime 30 tablet 0   No current facility-administered medications for this visit.    ROS Fourteen-point review of systems is otherwise negative with the exception of the findings described above   Objective Data   Physical Exam Vitals:   03/07/24 1123  BP: (!) 166/83  Pulse: 71  Temp: 37 C (98.6 F)     General:  Well-appearing individual, in no apparent distress  Skin:  Intact, no rashes, ulcers or wounds noted  HEENT: Sclera anicteric, mucous membranes moist, face symmetric  Neck: No carotid bruits, 2+ carotid pulses, no cervical adenopathy  Lungs:  Non-labored breathing on room air  Heart:  Regular rate and rhythm  Abdomen: Soft and non-tender, no palpable masses  Extremities:  Intact without ulceration or wounds, full range of motion   Psychiatric: Oriented to person, place with normal affect and mood  Neurologic:  Extraocular muscles are intact, cranial nerves are grossly intact without deficits, no focal peripheral neurological deficits     Pertinent Labs   Chemistry      Component Value Date/Time   NA 130 (L) 09/26/2022 0420   K 3.9 09/26/2022 0420   CL 98 09/26/2022 0420   CO2 24 09/26/2022 0420   BUN 13 09/26/2022 0420   CREATININE 0.8 09/26/2022 0420   GLUCOSE 106 09/26/2022 0420      Component Value Date/Time   CALCIUM  9.3 09/26/2022 0420   ALKPHOS 28 09/24/2022 1048   AST 22 09/24/2022 1048   ALT 17 09/24/2022 1048   TBILI 0.7 09/24/2022 1048       Pertinent Imaging  12.9 2023 CT head and neck angiogram IMPRESSION: 1. No evidence of intracranial vessel flow limiting stenosis or aneurysm. 2. No hemodynamically significant stenosis of the right carotid  system by NASCET criteria. 3. 70% or greater diameter stenosis of the left carotid system by NASCET criteria. This is unchanged in the interim. 4. Vertebral arteries are patent. 5. Borderline hemodynamically significant stenosis at the origin of the innominate artery from the arch. Potential hemodynamically significant stenoses of the origin of the left common carotid artery and left subclavian artery from the arch. Unchanged. 6. High-grade stenosis at the origin of the right external carotid artery. Unchanged.   *Carotid stenosis measurements derived by comparing the narrowest segment with the distal carotid luminal diameter. PQRS Measure 195: CPT II 3100F   Electronically Signed by:  Ozell Berlin, MD, [facility] Electronically Signed on:  08/01/2022 9:06 PM  Impression and Medical Decision Making  Ms. Alewine is a 73 y.o. female with a previously asymptomatic high-grade LEFT ICA stenosis who had a left visual field acute impairment in late December 2023 and her LEFT ICA stenosis is thus now reclassified as a symptomatic lesion.  01/27/2023 INTERVAL MDM Patient is doing well after a left carotid endarterectomy done on 09/23/2022.  She has no residual stenosis on duplex and no current concerns.  She is still smoking and I have cautioned her again about the benefits of smoking cessation.  03/07/2024 INTERVAL MDM Ms. Ketchem continues to do well after left carotid endarterectomy done in February 2024.  Her carotid duplex today demonstrates no residual left-sided stenosis and no interval stenosis on the right side.  She will follow-up in 6 months with a repeat carotid duplex.  HTN - Continue current meds to control modifiable risk factors and prevent progression of vascular disease Hypercholesterolemia - Continue current meds to control modifiable risk factors and prevent progression of vascular disease Long term adult smoker.  We have discussed the various treatment options as well as  the risks, benefits and alternatives of each of these approaches, and the patient would like to proceed with operative endarterectomy.   PLAN Follow-up in 6 months with repeat carotid duplex. Continue aspirin 81 mg Milking sensation again advised.  Shila MICAEL Viktoria Teddie MD, Assistant Professor Division of Vascular and Endovascular Surgery

## 2024-03-12 ENCOUNTER — Telehealth: Payer: Self-pay | Admitting: Nurse Practitioner

## 2024-03-12 NOTE — Telephone Encounter (Signed)
 Patient called the answering service today.  She was last seen in our office in 2021 but she has an appointment coming up the end of this month ,   Patient had a bowel movement with blood this a.m.  No associated rectal pain.  No abdominal pain until a short while ago when she began having some generalized abdominal discomfort which has now resolved , She has not been constipated/straining .  She does take Plavix  and was recently restarted on daily aspirin.  She feels fine. No SHOB / lightheadedness.   Her last colonoscopy was greater than 10 years ago.   Recommendations: Keep upcoming appointment with our office.  In the interim if she has additional episodes of GI bleeding she should call our office.  If frequent bleeding or increased volume then go to ED.  She inquired about stopping her aspirin.  I advised her not to hold the aspirin until she can talk with the prescribing provider on Monday morning.

## 2024-03-14 ENCOUNTER — Telehealth: Payer: Self-pay

## 2024-03-14 NOTE — Telephone Encounter (Signed)
 Unable to reach patient by phone and left v/m requesting call back at [phone]. Sending note to Dr Cleatus Cleatus pooil and [facility] triage. Also please see GI  phone note on 03/12/24.

## 2024-03-14 NOTE — Telephone Encounter (Signed)
 SABRA

## 2024-03-14 NOTE — Telephone Encounter (Signed)
 Noted. Thanks.  Will await return call from patient.

## 2024-03-14 NOTE — Telephone Encounter (Signed)
 Unable to reach patient by phone and left v/m requesting call back at [phone].

## 2024-03-16 NOTE — Telephone Encounter (Signed)
 Noted. Thanks.

## 2024-03-16 NOTE — Telephone Encounter (Signed)
 I spoke with pt; pt said she had already spoken with Dr Avelina the on call dr, the PA in GI and pts vascular surgeon.and pt said she does not need anything now tht the vascuolr dr gave her good advise. I advised pt to call [facility] when needed and  I thanked pt for speaking with me. Sending FYIm to Dr Cleatus.

## 2024-03-25 ENCOUNTER — Ambulatory Visit: Admitting: Gastroenterology

## 2024-04-20 ENCOUNTER — Other Ambulatory Visit: Payer: Self-pay | Admitting: Family Medicine

## 2024-04-20 DIAGNOSIS — F419 Anxiety disorder, unspecified: Secondary | ICD-10-CM

## 2024-05-08 ENCOUNTER — Other Ambulatory Visit: Payer: Self-pay | Admitting: Family Medicine

## 2024-05-08 DIAGNOSIS — F419 Anxiety disorder, unspecified: Secondary | ICD-10-CM

## 2024-05-09 NOTE — Telephone Encounter (Signed)
 Name of Medication:  Alprazolam  Name of Pharmacy:  [facility] Last Fill or Written Date and Quantity:  04/11/24, #90 Last Office Visit and Type:  02/04/24, sinusitis Next Office Visit and Type:  none Last Controlled Substance Agreement Date:  none Last UDS:  none

## 2024-05-09 NOTE — Telephone Encounter (Signed)
 Duplicate request (see other 05/08/24 refill note).

## 2024-06-01 ENCOUNTER — Ambulatory Visit: Payer: Self-pay | Admitting: Gastroenterology

## 2024-06-01 ENCOUNTER — Ambulatory Visit: Admitting: Gastroenterology

## 2024-06-01 ENCOUNTER — Other Ambulatory Visit (INDEPENDENT_AMBULATORY_CARE_PROVIDER_SITE_OTHER)

## 2024-06-01 ENCOUNTER — Encounter: Payer: Self-pay | Admitting: Gastroenterology

## 2024-06-01 VITALS — BP 148/90 | HR 83 | Ht 63.0 in | Wt 191.0 lb

## 2024-06-01 DIAGNOSIS — Z8719 Personal history of other diseases of the digestive system: Secondary | ICD-10-CM

## 2024-06-01 DIAGNOSIS — R110 Nausea: Secondary | ICD-10-CM | POA: Diagnosis not present

## 2024-06-01 DIAGNOSIS — E871 Hypo-osmolality and hyponatremia: Secondary | ICD-10-CM

## 2024-06-01 DIAGNOSIS — Z860100 Personal history of colon polyps, unspecified: Secondary | ICD-10-CM | POA: Diagnosis not present

## 2024-06-01 DIAGNOSIS — K31A0 Gastric intestinal metaplasia, unspecified: Secondary | ICD-10-CM

## 2024-06-01 DIAGNOSIS — K625 Hemorrhage of anus and rectum: Secondary | ICD-10-CM | POA: Diagnosis not present

## 2024-06-01 DIAGNOSIS — K219 Gastro-esophageal reflux disease without esophagitis: Secondary | ICD-10-CM

## 2024-06-01 DIAGNOSIS — Z7902 Long term (current) use of antithrombotics/antiplatelets: Secondary | ICD-10-CM | POA: Insufficient documentation

## 2024-06-01 LAB — CBC
HCT: 35.1 % — ABNORMAL LOW (ref 36.0–46.0)
Hemoglobin: 11.9 g/dL — ABNORMAL LOW (ref 12.0–15.0)
MCHC: 34 g/dL (ref 30.0–36.0)
MCV: 83.5 fl (ref 78.0–100.0)
Platelets: 433 K/uL — ABNORMAL HIGH (ref 150.0–400.0)
RBC: 4.2 Mil/uL (ref 3.87–5.11)
RDW: 16 % — ABNORMAL HIGH (ref 11.5–15.5)
WBC: 6.5 K/uL (ref 4.0–10.5)

## 2024-06-01 LAB — BASIC METABOLIC PANEL WITH GFR
BUN: 17 mg/dL (ref 6–23)
CO2: 24 meq/L (ref 19–32)
Calcium: 9.7 mg/dL (ref 8.4–10.5)
Chloride: 102 meq/L (ref 96–112)
Creatinine, Ser: 1.02 mg/dL (ref 0.40–1.20)
GFR: 54.44 mL/min — ABNORMAL LOW (ref >60.00)
Glucose, Bld: 89 mg/dL (ref 70–99)
Potassium: 4.2 meq/L (ref 3.5–5.1)
Sodium: 133 meq/L — ABNORMAL LOW (ref 135–145)

## 2024-06-01 NOTE — Progress Notes (Signed)
 GASTROENTEROLOGY OUTPATIENT CLINIC VISIT   Primary Care Provider Cleatus Arlyss RAMAN, MD [address] [place] [address] [phone]  Patient Profile: Michelle Park is a 73 y.o. female with a pmh significant for prior CVA, Carotid Artery Disease (on Plavix ), hypertension, hyperlipidemia, chronic back pain, adrenal adenoma status post resection, GERD, Barrett's esophagus, Colon Polyps.  The patient presents to the [facility] for an evaluation and management of problem(s) noted below:  Problem List 1. History of Barrett's esophagus   2. Intestinal metaplasia of gastric mucosa   3. Gastroesophageal reflux disease without esophagitis   4. Chronic nausea   5. History of colon polyps   6. Rectal bleeding   7. Hyponatremia   8. Long term (current) use of antithrombotics/antiplatelets    Discussed the use of AI scribe software for clinical note transcription with the patient, who gave verbal consent to proceed.  History of Present Illness Please see prior notes for full details of HPI.  Interval History Michelle Park is a 73 year old female who presents for follow-up to discuss scheduling a colonoscopy and upper endoscopy.  I have not seen the patient in nearly 4-years.  When last seen in clinic we had recommended an EGD/Colonoscopy be completed.  Due to issues these were scheduled then cancelled.  She has some mild apprehension due to her underlying history of CVA and Carotid artery disease of anesthesia but knows that she should undergo further evaluation and surveillance.  Particularly, she is concerned about the duration of anesthesia. She has not had a stress test or heart evaluation since 2023 but will see Cardiology in November to confirm if any additional workup/management will be required.  She is thankfully not having significant GI symptoms. at this time.  Her GERD is well controlled.  She does have nausea in the AM but with time this passes.  Earlier  this summer however, she did have a more significant few days of rectal bleeding after re-initiation of Aspirin that she had been on previously.  After discussion with her Vascular surgery team, this was discontinued and she remained solely on Plavix .  He still has intermittent episodes of small volume BRB per toilet paper with the last occurrence a week ago.  She attributes to hemorrhoids.  Her bowel habits are otherwise normal.  She uses the restroom 1-2 times per day, and her stools are formed.  She cannot recall straining.  No melanic appearing or maroon appearing stools.  She takes Prevacid  (Lansoprazole ) first thing in the morning.  She has had rare though some bilateral discomfort in her back that she wonders if it could be kidney disease.  She had labs in May of this year, and she had no anemia at that time.   GI Review of Systems Positive as above Negative for odynophagia, dysphagia, melena  Review of Systems General: Denies fevers/chills/unintentional weight loss Cardiovascular: Denies chest pain Pulmonary: Denies shortness of breath Gastroenterological: See HPI Genitourinary: Denies darkened urine Hematological: Denies easy bruising/bleeding Dermatological: Denies jaundice Psychological: Mood is stable   Medications Current Outpatient Medications  Medication Sig Dispense Refill   ALPRAZolam  (XANAX ) 0.5 MG tablet Take 1 tablet by mouth three times daily as needed 90 tablet 1   clopidogrel  (PLAVIX ) 75 MG tablet Take 1 tablet (75 mg total) by mouth daily. 90 tablet 3   escitalopram  (LEXAPRO ) 20 MG tablet Take 1 tablet by mouth once daily (Patient taking differently: Take 10 mg by mouth daily.) 90 tablet 0  fenofibrate  160 MG tablet TAKE 1 TABLET BY MOUTH ONCE DAILY AFTER SUPPER 90 tablet 3   lansoprazole  (PREVACID ) 30 MG capsule Take 1 capsule (30 mg total) by mouth daily. 90 capsule 0   losartan  (COZAAR ) 100 MG tablet Take 1 tablet by mouth once daily (Patient taking differently:  Take 50 mg by mouth daily.) 90 tablet 0   rosuvastatin  (CRESTOR ) 5 MG tablet Take 1 tablet (5 mg total) by mouth at bedtime. 90 tablet 3   No current facility-administered medications for this visit.    Allergies Allergies  Allergen Reactions   Boniva [Ibandronate] Other (See Comments)    Arthralgias   Chantix [Varenicline] Other (See Comments)    GI intolerance   Lipitor [Atorvastatin] Other (See Comments)    Myalgias     Histories Past Medical History:  Diagnosis Date   Allergic rhinitis due to pollen    Anxiety    Aortic stenosis    Bell's palsy    Bronchitis    hx of   Chronic back pain    COPD (chronic obstructive pulmonary disease) (HCC)    Depression    Dyspnea    with activity only   Episodic mood disorder    Fibromyalgia    GERD (gastroesophageal reflux disease)    Headache    frequent   Heart murmur    Hyperlipidemia    Hypertension    Iron deficiency    Left carotid stenosis    PONV (postoperative nausea and vomiting)    Restless leg syndrome    Spinal stenosis    Stroke [facility])    Syncope    Past Surgical History:  Procedure Laterality Date   BACK SURGERY     2019 L spine Dr. Onetha   carotid artery surgery  09/2022   ESOPHAGOGASTRODUODENOSCOPY     EYE SURGERY     cataract bil   FOOT SURGERY  1979 and 1988   ingrown toenail     bil great toes   surgery   ROBOTIC ADRENALECTOMY Left 04/01/2019   Procedure: XI ROBOTIC LEFT ADRENALECTOMY;  Surgeon: Rubin Calamity, MD;  Location: [facility];  Service: General;  Laterality: Left;   TRANSFORAMINAL LUMBAR INTERBODY FUSION (TLIF) WITH PEDICLE SCREW FIXATION 1 LEVEL Right 07/15/2023   Procedure: RIGHT-SIDED LUMBAR 4 - LUMBAR 5 TRANSFORAMINAL LUMBAR INTERBODY FUSION AND DECOMPRESSION WITH INSTRUMENTATION AND ALLOGRAFT;  Surgeon: Beuford Anes, MD;  Location: [facility] OR;  Service: Orthopedics;  Laterality: Right;   Social History   Socioeconomic History   Marital status: Divorced    Spouse name: Not on file    Number of children: 1   Years of education: Not on file   Highest education level: Associate degree: occupational, scientist, product/process development, or vocational program  Occupational History   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   1 daughter   Social Drivers of Corporate Investment Banker Strain: High Risk (  02/03/2024)   Overall Financial Resource Strain (CARDIA)    Difficulty of Paying Living Expenses: Very hard  Food Insecurity: Food Insecurity Present (02/03/2024)   Hunger Vital Sign    Worried About Running Out of Food in the Last Year: Sometimes true    Ran Out of Food in the Last Year: Often true  Transportation Needs: Unmet Transportation Needs (02/03/2024)   PRAPARE - Administrator, Civil Service (Medical): Yes    Lack of Transportation (Non-Medical): Yes  Physical Activity: Insufficiently Active (02/03/2024)   Exercise Vital Sign    Days of Exercise per Week: 1 day    Minutes of Exercise per Session: 30 min  Stress: Stress Concern Present (02/03/2024)   [employer] of Occupational Health - Occupational Stress Questionnaire    Feeling of Stress: To some extent  Social Connections: Moderately Integrated (02/03/2024)   Social Connection and Isolation Panel    Frequency of Communication with Friends and Family: More than three times a week    Frequency of Social Gatherings with Friends and Family: Twice a week    Attends Religious Services: More than 4 times per year    Active Member of [employer] or  Organizations: Yes    Attends Engineer, Structural: More than 4 times per year    Marital Status: Divorced  Recent Concern: Social Connections - Socially Isolated (12/02/2023)   Social Connection and Isolation Panel    Frequency of Communication with Friends and Family: Three times a week    Frequency of Social Gatherings with Friends and Family: Once a week    Attends Religious Services: Never    Database Administrator or Organizations: No    Attends Banker Meetings: Never    Marital Status: Divorced  Catering Manager Violence: Not At Risk (09/25/2023)   Humiliation, Afraid, Rape, and Kick questionnaire    Fear of Current or Ex-Partner: No    Emotionally Abused: No    Physically Abused: No    Sexually Abused: No   Family History  Problem Relation Age of Onset   Heart disease Mother    Alzheimer's disease Mother    Cancer Sister    Colon cancer Neg Hx    Esophageal cancer Neg Hx    Inflammatory bowel disease Neg Hx    Liver disease Neg Hx    Pancreatic cancer Neg Hx    Rectal cancer Neg Hx    Stomach cancer Neg Hx    Breast cancer Neg Hx    I have reviewed her medical, social, and family history in detail and updated the electronic medical record as necessary.    PHYSICAL EXAMINATION  BP (!) 148/90   Pulse 83   Ht 5' 3 (1.6 m)   Wt 191 lb (86.6 kg)   BMI 33.83 kg/m  Wt Readings from Last 3 Encounters:  06/01/24 191 lb (86.6 kg)  02/04/24 203 lb (92.1 kg)  12/03/23 207 lb 6.4 oz (94.1 kg)  GEN: NAD, appears stated age, doesn't appear chronically ill PSYCH: Cooperative, without pressured speech EYE: Conjunctivae pale-pink, sclerae anicteric ENT: MMM CV: Nontachycardic RESP: No audible wheezing GI: NABS, soft, mildly protuberant, rounded, nontender, without rebound MSK/EXT: No lower extremity edema SKIN: No jaundice NEURO:  Alert & Oriented x 3, no focal deficits   REVIEW OF DATA  I reviewed the following data at the time of this  encounter:  GI Procedures and Studies  Today we re-reviewed February 2020 EGD - No gross  lesions in esophagus. Biopsied. - Salmon-colored mucosa suggestive of short-segment Barrett's esophagus. Biopsied. - No gross lesions in the stomach. Biopsied. - No gross lesions in the duodenal bulb, in the first portion of the duodenum and in the second portion of the duodenum. Biopsied.   Pathology Diagnosis 1. Surgical [P], duodenal - BENIGN SMALL BOWEL MUCOSA. - NO ACTIVE INFLAMMATION OR VILLOUS ATROPHY IDENTIFIED. 2. 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 - THERE IS NO EVIDENCE OF HELICOBACTER PYLORI, DYPLASIA OR MALIGNANCY. - SEE COMMENT. 3. Surgical [P], GE junction - INTESTINAL METAPLASIA (GOBLET CELL METAPLASIA) CONSISTENT WITH BARRETT'S ESOPHAGUS. - THERE IS NO EVIDENCE OF DYSPLASIA OR MALIGNANCY. 4. Surgical [P], random esophageal - BENIGN SQUAMOUS MUCOSA. - THERE IS NO EVIDENCE OF INCREASE IN EOSINOPHILS, DYSPLASIA OR MALIGNANCY.  2011 Colonoscopy - [facility] 2 sessile polyps in the [place] removed with cold forceps.  Few small diverticula were found in the [place].  Otherwise normal examination.  Plan was for a 5-year follow up colonoscopy.  Laboratory Studies  Reviewed in epic  Imaging Studies  No new relevant imaging studies to review   ASSESSMENT/PLAN  Ms. Sago is a 73 y.o. female with a pmh significant for GERD, hypertension, hyperlipidemia, chronic back pain, adrenal adenoma status post resection, Barrett's esophagus.  The patient is seen today for evaluation and management of:  1. History of Barrett's esophagus   2. Intestinal metaplasia of gastric mucosa   3. Gastroesophageal reflux disease without esophagitis   4. Chronic nausea   5. History of colon polyps   6. Rectal bleeding   7. Hyponatremia   8. Long term (current) use of antithrombotics/antiplatelets    The patient is clinically and hemodynamically stable at this  time.   History of Colon Polyps - Surveillance Colonoscopy to be scheduled (well overdue based >10 years per her report) - Plavix  hold per PCP 5-days prior   Rectal bleeding Intermittent rectal bleeding, last episode a week ago, likely due to hemorrhoids.  Offered DRE/perianal examination, but patient OK to proceed with colonoscopy to confirm no other issues at play.  Her last colonoscopy was >10 years ago and she has history of polyps. - Scheduled next available colonoscopy (patient desires for beginning of the year - so we will respect her wishes) - Make movements/changes if Cardiology evaluation is further required after her visit with them - CBC today   Barrett's esophagus without dysplasia Barrett's esophagus without dysplasia. No current symptoms of dysphagia or odynophagia. Regular follow-up with endoscopy recommended. - Plan for endoscopy after colonoscopy as she does not want to have 2 procedures together. - Continue current PPI dosing.  Chronic nausea Morning nausea resolving spontaneously. No current medication for nausea.    History AS and Carotid Artery Disease and Strokes - Follow up with cardiologist in November to assess cardiac status and determine need for further testing.  Hyponatremia Chronic hyponatremia. - Ordered BMP to check sodium levels and kidney function.    Orders Placed This Encounter  Procedures   CBC   Basic Metabolic Panel (BMET)    New Prescriptions   No medications on file   Modified Medications   No medications on file    Planned Follow Up: No follow-ups on file.   Total Time in Face-to-Face and in Coordination of Care for patient including independent/personal interpretation/review of prior testing, medical history, examination, medication adjustment, communicating results with the patient directly, and documentation with the EHR is 45 minutes.   Aloha Finner, MD  [facility] Office #  [phone]

## 2024-06-01 NOTE — Patient Instructions (Signed)
 Your provider has requested that you go to the basement level for lab work before leaving today. Press B on the elevator. The lab is located at the first door on the left as you exit the elevator.  Due to recent changes in healthcare laws, you may see the results of your imaging and laboratory studies on MyChart before your provider has had a chance to review them.  We understand that in some cases there may be results that are confusing or concerning to you. Not all laboratory results come back in the same time frame and the provider may be waiting for multiple results in order to interpret others.  Please give us  48 hours in order for your provider to thoroughly review all the results before contacting the office for clarification of your results.   You will be due for a recall colonoscopy in Jan 2026. We will send you a reminder in the mail when it gets closer to that time.  You will be due for a recall EGD in March 2026. We will send you a reminder in the mail when it gets closer to that time.   _______________________________________________________  If your blood pressure at your visit was 140/90 or greater, please contact your primary care physician to follow up on this.  _______________________________________________________  If you are age 35 or older, your body mass index should be between 23-30. Your Body mass index is 33.83 kg/m. If this is out of the aforementioned range listed, please consider follow up with your Primary Care Provider.  If you are age 26 or younger, your body mass index should be between 19-25. Your Body mass index is 33.83 kg/m. If this is out of the aformentioned range listed, please consider follow up with your Primary Care Provider.   ________________________________________________________  The [facility] providers would like to encourage you to use MYCHART to communicate with providers for non-urgent requests or questions.  Due to long hold times on the  telephone, sending your provider a message by [facility] may be a faster and more efficient way to get a response.  Please allow 48 business hours for a response.  Please remember that this is for non-urgent requests.  _______________________________________________________  [facility] is using a team-based approach to care.  Your team is made up of your doctor and two to three APPS. Our APPS (Nurse Practitioners and Physician Assistants) work with your physician to ensure care continuity for you. They are fully qualified to address your health concerns and develop a treatment plan. They communicate directly with your gastroenterologist to care for you. Seeing the Advanced Practice Practitioners on your physician's team can help you by facilitating care more promptly, often allowing for earlier appointments, access to diagnostic testing, procedures, and other specialty referrals.   Thank you for choosing me and [facility].  Dr. Wilhel[place]ia

## 2024-06-02 ENCOUNTER — Other Ambulatory Visit: Payer: Self-pay

## 2024-06-02 DIAGNOSIS — K625 Hemorrhage of anus and rectum: Secondary | ICD-10-CM

## 2024-06-02 DIAGNOSIS — Z8719 Personal history of other diseases of the digestive system: Secondary | ICD-10-CM

## 2024-06-02 DIAGNOSIS — K31A0 Gastric intestinal metaplasia, unspecified: Secondary | ICD-10-CM

## 2024-06-02 LAB — IBC + FERRITIN
Ferritin: 17.4 ng/mL (ref 10.0–291.0)
Iron: 33 ug/dL — ABNORMAL LOW (ref 42–145)
Saturation Ratios: 7 % — ABNORMAL LOW (ref 20.0–50.0)
TIBC: 471.8 ug/dL — ABNORMAL HIGH (ref 250.0–450.0)
Transferrin: 337 mg/dL (ref 212.0–360.0)

## 2024-06-02 LAB — VITAMIN B12: Vitamin B-12: 190 pg/mL — ABNORMAL LOW (ref 211–911)

## 2024-06-02 LAB — FOLATE: Folate: 14.5 ng/mL (ref >5.9)

## 2024-06-03 ENCOUNTER — Other Ambulatory Visit: Payer: Self-pay

## 2024-06-03 DIAGNOSIS — D649 Anemia, unspecified: Secondary | ICD-10-CM

## 2024-06-03 MED ORDER — FERROUS GLUCONATE 324 (38 FE) MG PO TABS: 324.0000 mg | ORAL_TABLET | Freq: Every day | ORAL | 3 refills | Status: AC

## 2024-06-08 ENCOUNTER — Other Ambulatory Visit: Payer: Self-pay | Admitting: Family Medicine

## 2024-06-08 DIAGNOSIS — K219 Gastro-esophageal reflux disease without esophagitis: Secondary | ICD-10-CM

## 2024-06-27 NOTE — Progress Notes (Unsigned)
 Cardiology Clinic Note   Patient Name: Michelle Park Date of Encounter: 06/29/2024  Primary Care Provider:  Cleatus Arlyss RAMAN, MD Primary Cardiologist:  Maude Emmer, MD  Patient Profile    Michelle Park 73 year old female presents to the clinic today for follow-up evaluation of her aortic stenosis and preoperative cardiac evaluation.  Past Medical History    Past Medical History:  Diagnosis Date   Allergic rhinitis due to pollen    Anxiety    Aortic stenosis    Bell's palsy    Bronchitis    hx of   Chronic back pain    COPD (chronic obstructive pulmonary disease) (HCC)    Depression    Dyspnea    with activity only   Episodic mood disorder    Fibromyalgia    GERD (gastroesophageal reflux disease)    Headache    frequent   Heart murmur    Hyperlipidemia    Hypertension    Iron deficiency    Left carotid stenosis    PONV (postoperative nausea and vomiting)    Restless leg syndrome    Spinal stenosis    Stroke [facility])    Syncope    Past Surgical History:  Procedure Laterality Date   BACK SURGERY     2019 L spine Dr. Onetha   carotid artery surgery  09/2022   ESOPHAGOGASTRODUODENOSCOPY     EYE SURGERY     cataract bil   FOOT SURGERY  1979 and 1988   ingrown toenail     bil great toes   surgery   ROBOTIC ADRENALECTOMY Left 04/01/2019   Procedure: XI ROBOTIC LEFT ADRENALECTOMY;  Surgeon: Rubin Calamity, MD;  Location: [facility];  Service: General;  Laterality: Left;   TRANSFORAMINAL LUMBAR INTERBODY FUSION (TLIF) WITH PEDICLE SCREW FIXATION 1 LEVEL Right 07/15/2023   Procedure: RIGHT-SIDED LUMBAR 4 - LUMBAR 5 TRANSFORAMINAL LUMBAR INTERBODY FUSION AND DECOMPRESSION WITH INSTRUMENTATION AND ALLOGRAFT;  Surgeon: Beuford Anes, MD;  Location: [facility] OR;  Service: Orthopedics;  Laterality: Right;    Allergies  Allergies  Allergen Reactions   Boniva [Ibandronate] Other (See Comments)    Arthralgias   Chantix [Varenicline] Other (See Comments)    GI intolerance    Lipitor [Atorvastatin] Other (See Comments)    Myalgias     History of Present Illness    [name] has a PMH of syncope that was felt to be vagal mediated, aortic stenosis, hypertension, GERD, left carotid stenosis, left ICA aneurysm and tobacco use.  She was seen in the emergency department 4/22 after being bitten by a dog.  She had been bit in both calves.  She had gone home.  She noted dyspnea and dizziness.  It was not sure if she passed out.  She contacted EMS and did not note any cardiac issues.  She was noted to have small puncture wound in her right calf.  She had a EKG which showed no acute changes.  Her lab work was reassuring.  She denied history of TIA, CVA, and arrhythmia.  Her echocardiogram 12/05/2020 showed an EF of 60-65%, mild MR, mild aortic stenosis with a mean gradient of 10 and a peak gradient of 17 mmHg.  She had carotid Dopplers which showed 40-59% LICA stenosis.  She establish care with Dr. Emmer 7/22.  She had low risk stress testing 8/22.  She was seen by Jerrie RIGGERS on 12/26/2021.  During that time she presented for preoperative cardiac evaluation.  She was noting severe  back pain and bilateral sciatic radiation.  She had chronic dyspnea which was attributed to tobacco smoking.  She denied exertional chest pain or pressure.  She denied orthopnea, PND, syncope, lower extremity swelling and melena.  She continued to do mild yard work and weed eating without issue.  She presented to the clinic 06/01/23 for follow-up evaluation and preoperative cardiac evaluation.  She stated she was ready for her back surgery.  She was limited in her physical activity due to her pain.  She was able to dig in front of her basement door for about 45 minutes recently after a large rain storm.  She denied chest pain with the activity.  Her blood pressure was well-controlled.  Her cholesterol was managed by her PCP.  We reviewed her  EKG.   We reviewed her upcoming surgery.  I  gave her the salty  6 diet sheet, asked her to increase her physical activity as tolerated and planned follow-up in 12 months.  She presents to the clinic today for follow-up evaluation and states has been able to be more physically active since her back surgery.  She does both activities inside and outside of her house.  She lives alone.  She notes that she is planning to have upper endoscopy and colonoscopy.  She requests repeat echocardiogram for evaluation of her aortic valve.  She reports that she has tried to stop smoking several times.  I offered her Wellbutrin prescription today.  She reports that she would like to think about this and contact office if she would like to proceed.  Her blood pressure is well-controlled today at 129/82.  Her EKG shows sinus rhythm 79 bpm.  Will plan follow-up in 12 months.  Today she denies chest pain, shortness of breath, lower extremity edema, fatigue, palpitations, melena, hematuria, hemoptysis, diaphoresis, weakness, presyncope, syncope, orthopnea, and PND.    Home Medications    Prior to Admission medications   Medication Sig Start Date End Date Taking? Authorizing Provider  acetaminophen  (TYLENOL ) 500 MG tablet Take 500 mg by mouth daily as needed for moderate pain.    [provider]  ALPRAZolam  (XANAX ) 0.5 MG tablet Take 1 tablet by mouth three times daily as needed 05/12/23   Cleatus Arlyss RAMAN, MD  aspirin EC 81 MG tablet Take 81 mg by mouth at bedtime.    [provider]  Calcium  Carbonate-Vitamin D  (CALCIUM  600-VITAMIN D3 PO) Take 1 tablet by mouth 2 (two) times daily.    [provider]  Cholecalciferol (VITAMIN D -3 PO) Take 1 capsule by mouth daily.    [provider]  clopidogrel  (PLAVIX ) 75 MG tablet Take 1 tablet by mouth once daily 03/05/23   Cleatus Arlyss RAMAN, MD  escitalopram  (LEXAPRO ) 20 MG tablet Take 1 tablet (20 mg total) by mouth daily. 11/25/22   Cleatus Arlyss RAMAN, MD  fenofibrate  160 MG tablet TAKE 1 TABLET BY MOUTH ONCE  DAILY AFTER SUPPER 11/25/22   Cleatus Arlyss RAMAN, MD  HYDROcodone -acetaminophen  (NORCO) 10-325 MG tablet Take 1 tablet by mouth every 6 (six) hours as needed for severe pain.    [provider]  lansoprazole  (PREVACID ) 30 MG capsule Take 1 capsule (30 mg total) by mouth daily. 05/19/23   Cleatus Arlyss RAMAN, MD  losartan  (COZAAR ) 100 MG tablet Take 1 tablet (100 mg total) by mouth daily. 10/10/22   Cleatus Arlyss RAMAN, MD  Multiple Vitamins-Minerals (CENTRUM WOMEN) TABS Take 1 tablet by mouth daily.    [provider]  rosuvastatin  (  CRESTOR ) 5 MG tablet TAKE 1 TABLET BY MOUTH AT BEDTIME 02/11/23   Cleatus Arlyss RAMAN, MD    Family History    Family History  Problem Relation Age of Onset   Heart disease Mother    Alzheimer's disease Mother    Cancer Sister    Colon cancer Neg Hx    Esophageal cancer Neg Hx    Inflammatory bowel disease Neg Hx    Liver disease Neg Hx    Pancreatic cancer Neg Hx    Rectal cancer Neg Hx    Stomach cancer Neg Hx    Breast cancer Neg Hx    She indicated that her mother is deceased. She indicated that her father is deceased. She indicated that her sister is deceased. She indicated that her brother is deceased. She indicated that the status of her neg hx is unknown.  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   1 daughter   Social Drivers of Corporate Investment Banker Strain: High Risk (02/03/2024)   Overall Financial Resource Strain (CARDIA)    Difficulty of Paying Living Expenses: Very hard  Food Insecurity: Food Insecurity Present (02/03/2024)   Hunger Vital Sign    Worried About Running Out of Food in the Last Year: Sometimes true    Ran Out of Food in the Last Year: Often true  Transportation Needs: Unmet Transportation Needs (02/03/2024)   PRAPARE - Administrator, Civil Service (Medical): Yes    Lack of Transportation (Non-Medical): Yes  Physical Activity: Insufficiently Active (02/03/2024)   Exercise Vital Sign    Days of Exercise per Week: 1 day    Minutes of Exercise per Session: 30 min  Stress: Stress Concern Present (02/03/2024)   [employer] of Occupational Health - Occupational Stress Questionnaire    Feeling of Stress: To some extent  Social Connections: Moderately Integrated (02/03/2024)   Social Connection and Isolation Panel    Frequency of Communication with Friends and Family: More than three times a week    Frequency of Social Gatherings with Friends and Family: Twice a week    Attends Religious Services: More than 4 times per year    Active Member of [employer] or Organizations: Yes    Attends Engineer, Structural: More than 4 times per year    Marital Status: Divorced  Recent Concern: Social Connections - Socially Isolated (12/02/2023)   Social Connection and Isolation Panel    Frequency of Communication with Friends and Family: Three times a week    Frequency of Social Gatherings with Friends and  Family: Once a week    Attends Religious Services: Never    Active Member of Clubs or Organizations: No    Attends Banker Meetings: Never    Marital Status: Divorced  Catering Manager Violence: Not At Risk  (09/25/2023)   Humiliation, Afraid, Rape, and Kick questionnaire    Fear of Current or Ex-Partner: No    Emotionally Abused: No    Physically Abused: No    Sexually Abused: No     Review of Systems    General:  No chills, fever, night sweats or weight changes.  Cardiovascular:  No chest pain, dyspnea on exertion, edema, orthopnea, palpitations, paroxysmal nocturnal dyspnea. Dermatological: No rash, lesions/masses Respiratory: No cough, dyspnea Urologic: No hematuria, dysuria Abdominal:   No nausea, vomiting, diarrhea, bright red blood per rectum, melena, or hematemesis Neurologic:  No visual changes, wkns, changes in mental status. All other systems reviewed and are otherwise negative except as noted above.  Physical Exam    VS:  BP 129/82   Pulse 84   Ht 5' 5 (1.651 m)   Wt 193 lb (87.5 kg)   SpO2 96%   BMI 32.12 kg/m  , BMI Body mass index is 32.12 kg/m. GEN: Well nourished, well developed, in no acute distress. HEENT: normal. Neck: Supple, no JVD, carotid bruits, or masses. Cardiac: RRR, 3/6 systolic murmur heard along right sternal border, rubs, or gallops. No clubbing, cyanosis, edema.  Radials/DP/PT 2+ and equal bilaterally.  Respiratory:  Respirations regular and unlabored, clear to auscultation bilaterally. GI: Soft, nontender, nondistended, BS + x 4. MS: no deformity or atrophy. Skin: warm and dry, no rash. Neuro:  Strength and sensation are intact. Psych: Normal affect.  Accessory Clinical Findings    Recent Labs: 12/03/2023: ALT 10; Pro B Natriuretic peptide (BNP) 30.0; TSH 1.66 06/01/2024: BUN 17; Creatinine, Ser 1.02; Hemoglobin 11.9; Platelets 433.0; Potassium 4.2; Sodium 133   Recent Lipid Panel    Component Value Date/Time   CHOL 180 12/03/2023 1450   TRIG 145.0 12/03/2023 1450   HDL 41.70 12/03/2023 1450   CHOLHDL 4 12/03/2023 1450   VLDL 29.0 12/03/2023 1450   LDLCALC 109 (H) 12/03/2023 1450   LDLDIRECT 168.0 04/23/2021 1119         ECG  personally reviewed by me today-EKG Interpretation Date/Time:  Wednesday June 29 2024 13:18:00 EST Ventricular Rate:  79 PR Interval:  196 QRS Duration:  96 QT Interval:  354 QTC Calculation: 405 R Axis:   19  Text Interpretation: Normal sinus rhythm Confirmed by Emelia Hazy [phone]) on 06/29/2024 1:24:13 PM   Echocardiogram 01/08/2022   IMPRESSIONS     1. Left ventricular ejection fraction, by estimation, is 55 to 60%. Left  ventricular ejection fraction by 3D volume is 58 %. The left ventricle has  normal function. The left ventricle has no regional wall motion  abnormalities. There is mild concentric  left ventricular hypertrophy. Left ventricular diastolic parameters are  consistent with Grade II diastolic dysfunction (pseudonormalization).  Elevated left atrial pressure. The average left ventricular global  longitudinal strain is -16.7 %. The global  longitudinal strain is abnormal.   2. Right ventricular systolic function is normal. The right ventricular  size is normal. Tricuspid regurgitation signal is inadequate for assessing  PA pressure.   3. Left atrial size was mildly dilated.   4. The mitral valve is normal in structure. No evidence of mitral valve  regurgitation.   5. The aortic valve is tricuspid. There is mild  calcification of the  aortic valve. There is mild thickening of the aortic valve. Aortic valve  regurgitation is mild. Mild aortic valve stenosis.   Comparison(s): No significant change from prior study. Prior images  reviewed side by side.   FINDINGS   Left Ventricle: Left ventricular ejection fraction, by estimation, is 55  to 60%. Left ventricular ejection fraction by 3D volume is 58 %. The left  ventricle has normal function. The left ventricle has no regional wall  motion abnormalities. The average  left ventricular global longitudinal strain is -16.7 %. The global  longitudinal strain is abnormal. The left ventricular internal cavity size   was normal in size. There is mild concentric left ventricular hypertrophy.  Left ventricular diastolic parameters   are consistent with Grade II diastolic dysfunction (pseudonormalization).  Elevated left atrial pressure.   Right Ventricle: The right ventricular size is normal. No increase in  right ventricular wall thickness. Right ventricular systolic function is  normal. Tricuspid regurgitation signal is inadequate for assessing PA  pressure.   Left Atrium: Left atrial size was mildly dilated.   Right Atrium: Right atrial size was normal in size.   Pericardium: There is no evidence of pericardial effusion.   Mitral Valve: The mitral valve is normal in structure. No evidence of  mitral valve regurgitation.   Tricuspid Valve: The tricuspid valve is normal in structure. Tricuspid  valve regurgitation is not demonstrated.   Aortic Valve: The aortic valve is tricuspid. There is mild calcification  of the aortic valve. There is mild thickening of the aortic valve. Aortic  valve regurgitation is mild. Mild aortic stenosis is present. Aortic valve  mean gradient measures 6.0 mmHg.   Aortic valve peak gradient measures 10.6 mmHg. Aortic valve area, by VTI  measures 1.66 cm.   Pulmonic Valve: The pulmonic valve was not well visualized. Pulmonic valve  regurgitation is not visualized.   Aorta: The aortic root and ascending aorta are structurally normal, with  no evidence of dilitation.   IAS/Shunts: No atrial level shunt detected by color flow Doppler.   Carotid ultrasound 12/31/2021  IMPRESSION: 1. Progressive left internal carotid artery stenosis compared to prior imaging from 2021. Severe (70-99%) stenosis proximal left internal carotid artery secondary to bulky calcified atherosclerotic plaque. 2. Mild (1-49%) stenosis proximal right internal carotid artery secondary to echogenic/calcified atherosclerotic plaque. 3. Vertebral arteries are patent with normal antegrade  flow.    Assessment & Plan   1.  Aortic stenosis-continues to tolerate activity fairly well.  3/6 systolic murmur heard along right sternal border.  Today she denies increased DOE or activity intolerance.  She notes that she has been more physically active since having back surgery.  Her echocardiogram 01/08/2022 showed LVEF of 55-60%, mild concentric LVH, G2 DD, and mild calcification of the aortic valve.  She was noted to have mild aortic valve stenosis.  She underwent repeat echocardiogram at [facility].  Results available in care everywhere.  Similar where findings to previous echo. Repeat echocardiogram  Carotid stenosis-denies lightheadedness, presyncope or syncope.  Carotid ultrasound 12/31/2021 showed severe 70-99% stenosis of proximal left ICA, 1-49% stenosis of proximal right ICA.  Carotid ultrasound 02/07/2022 showed left ICA 80-99% stenosis Continue rosuvastatin , clopidogrel   Hyperlipidemia-LDL 127 on 04/25/22. 12/03/2023: Cholesterol 180; HDL 41.70; LDL Cholesterol 109; Triglycerides 145.0; VLDL 29.0 High-fiber diet Continue fenofibrate , rosuvastatin , clopidogrel  Follows with pcp  Essential hypertension-BP today 129/82 Maintain blood pressure log Continue losartan  Heart healthy low-sodium diet  Tobacco smoking-continues to smoke.  And reports  that she has tried to quit several times. Offered Wellbutrin prescription-she will contact office if she wishes to proceed with this.  Colonoscopy/endoscopy-reports that she we will need this in the next few weeks/months.  She would be acceptable risk to proceed with planned procedure without further cardiac testing.  Dr. Cleatus manages her Plavix .  Disposition: Follow-up with Dr. Delford or me in 12 months.   Josefa HERO. Joevanni Roddey NP-C     06/29/2024, 1:49 PM [facility] [address] Office [phone] Fax [phone]    I spent 14 minutes examining this patient, reviewing medications, and using  patient centered shared decision making involving her cardiac care.   I spent greater than 20 minutes reviewing her past medical history,  medications, and prior cardiac tests.

## 2024-06-29 ENCOUNTER — Ambulatory Visit: Attending: General Practice | Admitting: General Practice

## 2024-06-29 ENCOUNTER — Encounter: Payer: Self-pay | Admitting: General Practice

## 2024-06-29 VITALS — BP 129/82 | HR 84 | Ht 65.0 in | Wt 193.0 lb

## 2024-06-29 DIAGNOSIS — I350 Nonrheumatic aortic (valve) stenosis: Secondary | ICD-10-CM

## 2024-06-29 DIAGNOSIS — I6523 Occlusion and stenosis of bilateral carotid arteries: Secondary | ICD-10-CM

## 2024-06-29 DIAGNOSIS — Z720 Tobacco use: Secondary | ICD-10-CM

## 2024-06-29 DIAGNOSIS — I10 Essential (primary) hypertension: Secondary | ICD-10-CM | POA: Diagnosis not present

## 2024-06-29 NOTE — Patient Instructions (Addendum)
 Thank you for choosing [facility]!     Medication Instructions:  If you decide on whether you want to begin taking Wellbutrin, please call Josefa Beauvais or Dr. Henry.   *If you need a refill on your cardiac medications before your next appointment, please call your pharmacy*   Lab Work: No labs were ordered during today's visit.  If you have labs (blood work) drawn today and your tests are completely normal, you will receive your results only by: MyChart Message (if you have MyChart) OR A paper copy in the mail If you have any lab test that is abnormal or we need to change your treatment, we will call you to review the results.   Testing/Procedures: Your physician has requested that you have an echocardiogram in January 2026. Echocardiography is a painless test that uses sound waves to create images of your heart. It provides your doctor with information about the size and shape of your heart and how well your heart's chambers and valves are working. This procedure takes approximately one hour. There are no restrictions for this procedure. Please do NOT wear cologne, perfume, aftershave, or lotions (deodorant is allowed). Please arrive 15 minutes prior to your appointment time.  Please note: We ask at that you not bring children with you during ultrasound (echo/ vascular) testing. Due to room size and safety concerns, children are not allowed in the ultrasound rooms during exams. Our front office staff cannot provide observation of children in our lobby area while testing is being conducted. An adult accompanying a patient to their appointment will only be allowed in the ultrasound room at the discretion of the ultrasound technician under special circumstances. We apologize for any inconvenience.   Your next appointment:   1 year(s)   Provider:   Josefa Beauvais     Follow-Up: At [facility], you and your health needs are our priority.  As part of our continuing  mission to provide you with exceptional heart care, we have created designated Provider Care Teams.  These Care Teams include your primary Cardiologist (physician) and Advanced Practice Providers (APPs -  Physician Assistants and Nurse Practitioners) who all work together to provide you with the care you need, when you need it. We recommend signing up for the patient portal called MyChart.  Sign up information is provided on this After Visit Summary.  MyChart is used to connect with patients for Virtual Visits (Telemedicine).  Patients are able to view lab/test results, encounter notes, upcoming appointments, etc.  Non-urgent messages can be sent to your provider as well.   To learn more about what you can do with MyChart, go to [email or web address].      [facility]:

## 2024-07-03 ENCOUNTER — Other Ambulatory Visit: Payer: Self-pay | Admitting: Family Medicine

## 2024-07-03 DIAGNOSIS — K219 Gastro-esophageal reflux disease without esophagitis: Secondary | ICD-10-CM

## 2024-07-04 ENCOUNTER — Other Ambulatory Visit: Payer: Self-pay | Admitting: Family Medicine

## 2024-07-04 DIAGNOSIS — F419 Anxiety disorder, unspecified: Secondary | ICD-10-CM

## 2024-07-06 NOTE — Telephone Encounter (Unsigned)
 Copied from CRM #[id number]. Topic: Clinical - Medication Question >> Jul 06, 2024 10:32 AM Berneda FALCON wrote: Reason for CRM: Patient calling to check on the status of her ALPRAZolam  (XANAX ) 0.5 MG tablet medication, she is completely out as of last night.  Would like to know if we could do a 2 or 3 month supply so she does not run out so quickly.  [facility] - [place], [place] - [address] [place] [address] Phone: [phone] Fax: [phone] Hours: Not open 24 hours  Patient callback is [phone] (home)

## 2024-07-06 NOTE — Telephone Encounter (Signed)
 Sent. I usually send 1 month supply with 1 RF.  If she will request the refill about 1 week prior to running out of the last refill, we can address it.  Thanks.

## 2024-07-24 ENCOUNTER — Other Ambulatory Visit: Payer: Self-pay | Admitting: Family Medicine

## 2024-07-24 DIAGNOSIS — F419 Anxiety disorder, unspecified: Secondary | ICD-10-CM

## 2024-08-02 ENCOUNTER — Ambulatory Visit: Admitting: Internal Medicine

## 2024-08-11 ENCOUNTER — Ambulatory Visit (HOSPITAL_COMMUNITY)
Admission: RE | Admit: 2024-08-11 | Discharge: 2024-08-11 | Disposition: A | Discharge status: Home / Self Care | Source: Ambulatory Visit | Attending: Cardiovascular Disease | Admitting: Cardiovascular Disease

## 2024-08-11 DIAGNOSIS — R55 Syncope and collapse: Secondary | ICD-10-CM | POA: Diagnosis not present

## 2024-08-11 DIAGNOSIS — I350 Nonrheumatic aortic (valve) stenosis: Secondary | ICD-10-CM | POA: Insufficient documentation

## 2024-08-11 LAB — ECHOCARDIOGRAM COMPLETE
AR max vel: 1.28 cm2
AV Area VTI: 1.21 cm2
AV Area mean vel: 1.26 cm2
AV Mean grad: 12 mmHg
AV Peak grad: 24 mmHg
Ao pk vel: 2.45 m/s
Area-P 1/2: 5.09 cm2
P 1/2 time: 705 ms
S' Lateral: 3 cm

## 2024-08-12 ENCOUNTER — Ambulatory Visit: Payer: Self-pay | Admitting: General Practice

## 2024-08-17 ENCOUNTER — Other Ambulatory Visit: Payer: Self-pay | Admitting: Family Medicine

## 2024-08-18 NOTE — Telephone Encounter (Signed)
 Please verify 50 vs 100mg  dosing and let me know so I can work on the refill.  Would need yearly visit set up for spring 2026.  Thanks.

## 2024-08-18 NOTE — Telephone Encounter (Signed)
 Patient has not had BP follow up since 12/03/23; okay to refill?

## 2024-08-19 NOTE — Telephone Encounter (Signed)
 Attempted to contact pt, there was no answer and her VM is still full.

## 2024-08-19 NOTE — Telephone Encounter (Signed)
Mail box full not able to leave voice mail.  

## 2024-08-22 NOTE — Telephone Encounter (Signed)
 Attempted to contact pt. No answer, vm box full.   Sending MyChart message.

## 2024-08-23 NOTE — Telephone Encounter (Signed)
 We have called patient x 3 as well as sending my chart not able to reach patient please advise if ok to mail letter and close encounter/decline refill

## 2024-08-24 NOTE — Telephone Encounter (Signed)
 Please send letter.  Note put on rx denial to contact clinic.  Thanks.

## 2024-08-25 NOTE — Telephone Encounter (Signed)
 Letter sent to home address on file

## 2024-08-25 NOTE — Telephone Encounter (Signed)
 No further action needed at this time.

## 2024-08-30 ENCOUNTER — Other Ambulatory Visit: Payer: Self-pay | Admitting: Family Medicine

## 2024-08-30 DIAGNOSIS — F419 Anxiety disorder, unspecified: Secondary | ICD-10-CM

## 2024-09-02 ENCOUNTER — Encounter: Payer: Self-pay | Admitting: Gastroenterology

## 2024-09-26 ENCOUNTER — Ambulatory Visit: Payer: Medicare Other
